# Patient Record
Sex: Female | Born: 1955 | Race: White | Hispanic: No | Marital: Single | Smoking: Former smoker
Health system: Southern US, Community
[De-identification: ages and names within clinical notes are randomized; demographics above are authoritative.]

## PROBLEM LIST (undated history)

## (undated) DIAGNOSIS — I10 Essential (primary) hypertension: Secondary | ICD-10-CM

## (undated) DIAGNOSIS — L719 Rosacea, unspecified: Secondary | ICD-10-CM

## (undated) DIAGNOSIS — F32A Depression, unspecified: Secondary | ICD-10-CM

## (undated) DIAGNOSIS — F419 Anxiety disorder, unspecified: Secondary | ICD-10-CM

## (undated) DIAGNOSIS — E079 Disorder of thyroid, unspecified: Secondary | ICD-10-CM

## (undated) DIAGNOSIS — M1990 Unspecified osteoarthritis, unspecified site: Secondary | ICD-10-CM

---

## 2010-06-30 DEATH — deceased

## 2014-12-31 HISTORY — PX: SHOULDER SURGERY: SHX246

## 2016-01-01 HISTORY — PX: SHOULDER SURGERY: SHX246

## 2022-02-08 ENCOUNTER — Encounter: Payer: Self-pay | Admitting: Student in an Organized Health Care Education/Training Program

## 2022-02-08 ENCOUNTER — Other Ambulatory Visit: Payer: Self-pay

## 2022-02-08 ENCOUNTER — Ambulatory Visit
Payer: Medicare Other | Attending: Student in an Organized Health Care Education/Training Program | Admitting: Student in an Organized Health Care Education/Training Program

## 2022-02-08 VITALS — BP 133/72 | HR 60 | Temp 97.1°F | Resp 18 | Ht 67.0 in | Wt 200.0 lb

## 2022-02-08 DIAGNOSIS — G894 Chronic pain syndrome: Secondary | ICD-10-CM | POA: Insufficient documentation

## 2022-02-08 DIAGNOSIS — M5136 Other intervertebral disc degeneration, lumbar region: Secondary | ICD-10-CM | POA: Diagnosis present

## 2022-02-08 DIAGNOSIS — M47816 Spondylosis without myelopathy or radiculopathy, lumbar region: Secondary | ICD-10-CM | POA: Diagnosis present

## 2022-02-08 DIAGNOSIS — Z9889 Other specified postprocedural states: Secondary | ICD-10-CM | POA: Insufficient documentation

## 2022-02-08 DIAGNOSIS — M51369 Other intervertebral disc degeneration, lumbar region without mention of lumbar back pain or lower extremity pain: Secondary | ICD-10-CM | POA: Insufficient documentation

## 2022-02-08 NOTE — Assessment & Plan Note (Signed)
Multimodal pain regimen of gabapentin 800 mg 3 times daily, lidocaine patch, Robaxin, hydrocodone daily as needed.  We will obtain urine toxicology screen today.

## 2022-02-08 NOTE — Assessment & Plan Note (Signed)
Therapeutic lumbar facet medial branch ablations right L3, L4, L5 every 6 months.  Previously done in [place].

## 2022-02-08 NOTE — Progress Notes (Signed)
Safety precautions to be maintained throughout the outpatient stay will include: orient to surroundings, keep bed in low position, maintain call bell within reach at all times, provide assistance with transfer out of bed and ambulation.  

## 2022-02-08 NOTE — Patient Instructions (Signed)

## 2022-02-08 NOTE — Progress Notes (Signed)
Sandy Byrd: Sandy Byrd  Service Category: E/M  Provider: Edward Jolly, MD  DOB: [birth year]  DOS: 02/08/2022  Referring Provider: Enid Baas, MD  MRN: [id number]  Setting: Ambulatory outpatient  PCP: Enid Baas, MD  Type: New Sandy Byrd  Specialty: Interventional Pain Management    Location: Office  Delivery: Face-to-face     Primary Reason(s) for Visit: Encounter for initial evaluation of one or more chronic problems (new to examiner) potentially causing chronic pain, and posing a threat to normal musculoskeletal function. (Level of risk: High) CC: Back Pain (R>L)  HPI  Sandy Byrd is a 67 y.o. year old, female Sandy Byrd, who comes for the first time to our practice referred by Enid Baas, MD for our initial evaluation of her chronic pain. She has Lumbar facet joint syndrome; Lumbar degenerative disc disease; Chronic pain syndrome; and H/O rotator cuff surgery (bilateral) on their problem list. Today she comes in for evaluation of her Back Pain (R>L)  Pain Assessment: Location: Lower, Right, Left (right pain is worse) Back (itchy on outer right thigh) Radiating: Pain radiates from lower right back into back of thigh into back of right knee. Onset: More than a month ago Duration: Chronic pain Quality: Sharp, Shooting, Constant Severity: 8 /10 (subjective, self-reported pain score)  Effect on ADL: "It really affects it because it is hard to sit and move to somewhere else because getting up is hard and keeps me at home". Timing: Constant Modifying factors: 3,000mg  tylenol a day, gabapentin, heating pads and ice BP: 133/72   HR: 60  Onset and Duration: Gradual Cause of pain: Unknown Severity: Getting worse, NAS-11 at its worse: 9/10, NAS-11 at its best: 5/10, NAS-11 now: 8/10, and NAS-11 on the average: 7/10 Timing: Afternoon, Night, During activity or exercise, and After activity or exercise Aggravating Factors: Bending, Climbing, Kneeling, Lifiting, Prolonged sitting,  and Prolonged standing Alleviating Factors: Stretching, Cold packs, Hot packs, and Medications Associated Problems: Depression, Fatigue, Spasms, Tingling, Weakness, Pain that wakes Sandy Byrd up, and Pain that does not allow Sandy Byrd to sleep Quality of Pain: Agonizing, Constant, Disabling, Exhausting, Horrible, Itching, Sharp, and Stabbing Previous Examinations or Tests: MRI scan, X-rays, and Orthopedic evaluation Previous Treatments: Narcotic medications  Sandy Byrd is a very pleasant 67 year old female who has recently moved from [place] hoping to establish with pain management in [place].  She has a history of lumbar facet joint syndrome, lumbar degenerative disc disease.  She was receiving lumbar facet ablations on the right side at L3, L4, L5 every 6 months and repeat which was effective in managing her lower back pain.  Her previous ablation on the right side was done in May 2022.  Of note she has tried previous interventions including diagnostic medial branch nerve blocks, epidural injections with limited response.  She states that she receives the greatest functional and analgesic response from the ablations every 6 months.  She has done physical therapy as well and continues to perform home stretching exercises that were directed to her by her previous pain specialist to help alleviate lumbar paraspinal muscle tension..  She utilizes hydrocodone 5 mg daily on an as-needed basis.  She is on a multimodal regimen which includes gabapentin, Robaxin, lidocaine patches.  She also uses Celebrex 20 mg daily as needed.  She has a history of bilateral shoulder specifically rotator cuff surgery.  The primary pain complaint however is her lower back.   Historic Controlled Substance Pharmacotherapy Review  PMP and historical list of controlled substances: Hydrocodone 5 mg daily as  needed Historical Monitoring: The Sandy Byrd  has no history on file for drug use. List of all UDS Test(s): No results found for:  MDMA, COCAINSCRNUR, PCPSCRNUR, PCPQUANT, CANNABQUANT, THCU, ETH List of other Serum/Urine Drug Screening Test(s):  No results found for: AMPHSCRSER, BARBSCRSER, BENZOSCRSER, COCAINSCRSER, COCAINSCRNUR, PCPSCRSER, PCPQUANT, THCSCRSER, THCU, CANNABQUANT, OPIATESCRSER, OXYSCRSER, PROPOXSCRSER, ETH Historical Background Evaluation: [place] PMP: PDMP not reviewed this encounter. Online review of the past 73-month period conducted.              [place] Department of public safety, offender search: Engineer, mining Information) Non-contributory Risk Assessment Profile: Aberrant behavior: None observed or detected today Risk factors for fatal opioid overdose: None identified today Fatal overdose hazard ratio (HR): Calculation deferred Non-fatal overdose hazard ratio (HR): Calculation deferred Risk of opioid abuse or dependence: 0.7-3.0% with doses ? 36 MME/day and 6.1-26% with doses ? 120 MME/day. Substance use disorder (SUD) risk level: See below Personal History of Substance Abuse (SUD-Substance use disorder):  Alcohol: Negative  Illegal Drugs: Negative  Rx Drugs: Negative  ORT Risk Level calculation: Low Risk  Opioid Risk Tool - 02/08/22 0933       Family History of Substance Abuse   Alcohol --   pt is adopted     Personal History of Substance Abuse   Alcohol Negative    Illegal Drugs Negative    Rx Drugs Negative      Age   Age between 16-45 years  No      Psychological Disease   Psychological Disease Negative    Depression Positive      Total Score   Opioid Risk Tool Scoring 1    Opioid Risk Interpretation Low Risk            ORT Scoring interpretation table:  Score <3 = Low Risk for SUD  Score between 4-7 = Moderate Risk for SUD  Score >8 = High Risk for Opioid Abuse   PHQ-2 Depression Scale:  Total score: 0  PHQ-2 Scoring interpretation table: (Score and probability of major depressive disorder)  Score 0 = No depression  Score 1 = 15.4% Probability  Score 2 = 21.1% Probability   Score 3 = 38.4% Probability  Score 4 = 45.5% Probability  Score 5 = 56.4% Probability  Score 6 = 78.6% Probability   PHQ-9 Depression Scale:  Total score: 0  PHQ-9 Scoring interpretation table:  Score 0-4 = No depression  Score 5-9 = Mild depression  Score 10-14 = Moderate depression  Score 15-19 = Moderately severe depression  Score 20-27 = Severe depression (2.4 times higher risk of SUD and 2.89 times higher risk of overuse)   Pharmacologic Plan: As per protocol, I have not taken over any controlled substance management, pending the results of ordered tests and/or consults.            Initial impression: Pending review of available data and ordered tests.  Meds   Current Outpatient Medications:    acetaminophen (TYLENOL) 500 MG tablet, Take by mouth., Disp: , Rfl:    celecoxib (CELEBREX) 200 MG capsule, Take 200 mg by mouth daily., Disp: , Rfl:    cyanocobalamin 1000 MCG tablet, Take by mouth., Disp: , Rfl:    EPINEPHrine 0.3 mg/0.3 mL IJ SOAJ injection, SMARTSIG:1 Pre-Filled Pen Syringe IM PRN, Disp: , Rfl:    ergocalciferol (VITAMIN D2) 1.25 MG (50000 UT) capsule, Take by mouth., Disp: , Rfl:    escitalopram (LEXAPRO) 20 MG tablet, Take by mouth., Disp: , Rfl:  fluticasone (FLONASE) 50 MCG/ACT nasal spray, Place into both nostrils daily., Disp: , Rfl:    folic acid (FOLVITE) 1 MG tablet, Take by mouth., Disp: , Rfl:    gabapentin (NEURONTIN) 800 MG tablet, Take 1 tablet by mouth 3 (three) times daily., Disp: , Rfl:    levothyroxine (SYNTHROID) 100 MCG tablet, Take by mouth., Disp: , Rfl:    lidocaine (LIDODERM) 5 %, Place 1 patch onto the skin as needed. 4%., Disp: , Rfl:    methocarbamol (ROBAXIN) 500 MG tablet, TAKE 1 TABLET BY MOUTH TWICE DAILY AS NEEDED FOR SPASMS, Disp: , Rfl:    metroNIDAZOLE (METROGEL) 1 % gel, Apply topically daily., Disp: , Rfl:    minocycline (MINOCIN) 100 MG capsule, Take by mouth., Disp: , Rfl:    Propylene Glycol (SYSTANE BALANCE OP), Apply to  eye., Disp: , Rfl:    simvastatin (ZOCOR) 40 MG tablet, Take 1 tablet by mouth at bedtime., Disp: , Rfl:    traZODone (DESYREL) 50 MG tablet, Take 1 tablet by mouth at bedtime., Disp: , Rfl:    triamcinolone cream (KENALOG) 0.1 %, Apply topically 2 (two) times daily as needed., Disp: , Rfl:    triamterene-hydrochlorothiazide (DYAZIDE) 37.5-25 MG capsule, Take 1 capsule by mouth daily., Disp: , Rfl:    VENTOLIN HFA 108 (90 Base) MCG/ACT inhaler, SMARTSIG:2 Puff(s) By Mouth Every 4 Hours PRN, Disp: , Rfl:   Imaging Review   Lumbar spine MRI without contrast 05/03/2021: L5-S1: Minimal disc bulge with out significant spinal or neuroforaminal stenosis, mild bilateral facet joint arthropathy. L4-L5 disc degeneration, no spinal or neuroforaminal stenosis, mild bilateral facet joint arthropathy. L3-L4: Minimal disc bulge, bilateral facet joint arthropathy, moderate. L2-L3: No spinal or neuroforaminal stenosis L1-L2: No spinal or neuroforaminal stenosis   Complexity Note: Imaging results reviewed. Results shared with Ms. Merlinda FrederickMcLaughlin, using Layman's terms.                         ROS  Cardiovascular: High blood pressure Pulmonary or Respiratory: No reported pulmonary signs or symptoms such as wheezing and difficulty taking a deep full breath (Asthma), difficulty blowing air out (Emphysema), coughing up mucus (Bronchitis), persistent dry cough, or temporary stoppage of breathing during sleep Neurological: No reported neurological signs or symptoms such as seizures, abnormal skin sensations, urinary and/or fecal incontinence, being born with an abnormal open spine and/or a tethered spinal cord Psychological-Psychiatric: Anxiousness, Depressed, and Difficulty sleeping and or falling asleep Gastrointestinal: No reported gastrointestinal signs or symptoms such as vomiting or evacuating blood, reflux, heartburn, alternating episodes of diarrhea and constipation, inflamed or scarred liver, or pancreas or  irrregular and/or infrequent bowel movements Genitourinary: No reported renal or genitourinary signs or symptoms such as difficulty voiding or producing urine, peeing blood, non-functioning kidney, kidney stones, difficulty emptying the bladder, difficulty controlling the flow of urine, or chronic kidney disease Hematological: No reported hematological signs or symptoms such as prolonged bleeding, low or poor functioning platelets, bruising or bleeding easily, hereditary bleeding problems, low energy levels due to low hemoglobin or being anemic Endocrine: High thyroid Rheumatologic: Joint aches and or swelling due to excess weight (Osteoarthritis) Musculoskeletal: Negative for myasthenia gravis, muscular dystrophy, multiple sclerosis or malignant hyperthermia Work History: Retired  Allergies  Ms. Merlinda FrederickMcLaughlin is allergic to cephalexin, cephalosporins, prochlorperazine, shrimp extract allergy skin test, latex, and other.  PFSH  Drug: Ms. Merlinda FrederickMcLaughlin  has no history on file for drug use. Alcohol:  has no history on file for alcohol  use. Tobacco:  reports that she quit smoking about 20 years ago. Her smoking use included cigarettes. She has never been exposed to tobacco smoke. She has never used smokeless tobacco. Medical:  has no past medical history on file. Family: family history is not on file.  Active Ambulatory Problems    Diagnosis Date Noted   Lumbar facet joint syndrome 02/08/2022   Lumbar degenerative disc disease 02/08/2022   Chronic pain syndrome 02/08/2022   H/O rotator cuff surgery (bilateral) 02/08/2022   Resolved Ambulatory Problems    Diagnosis Date Noted   No Resolved Ambulatory Problems   No Additional Past Medical History   Constitutional Exam  General appearance: Well nourished, well developed, and well hydrated. In no apparent acute distress Vitals:   02/08/22 0916  BP: 133/72  Pulse: 60  Resp: 18  Temp: (!) 97.1 F (36.2 C)  TempSrc: Temporal  SpO2: 95%   Weight: 200 lb (90.7 kg)  Height: 5\' 7"  (1.702 m)   BMI Assessment: Estimated body mass index is 31.32 kg/m as calculated from the following:   Height as of this encounter: 5\' 7"  (1.702 m).   Weight as of this encounter: 200 lb (90.7 kg).  BMI interpretation table: BMI level Category Range association with higher incidence of chronic pain  <18 kg/m2 Underweight   18.5-24.9 kg/m2 Ideal body weight   25-29.9 kg/m2 Overweight Increased incidence by 20%  30-34.9 kg/m2 Obese (Class I) Increased incidence by 68%  35-39.9 kg/m2 Severe obesity (Class II) Increased incidence by 136%  >40 kg/m2 Extreme obesity (Class III) Increased incidence by 254%   Sandy Byrd's current BMI Ideal Body weight  Body mass index is 31.32 kg/m. Ideal body weight: 61.6 kg (135 lb 12.9 oz) Adjusted ideal body weight: 73.2 kg (161 lb 7.7 oz)   BMI Readings from Last 4 Encounters:  02/08/22 31.32 kg/m   Wt Readings from Last 4 Encounters:  02/08/22 200 lb (90.7 kg)    Psych/Mental status: Alert, oriented x 3 (person, place, & time)       Eyes: PERLA Respiratory: No evidence of acute respiratory distress  Cervical Spine Area Exam  Skin & Axial Inspection: No masses, redness, edema, swelling, or associated skin lesions Alignment: Symmetrical Functional ROM: Unrestricted ROM      Stability: No instability detected Muscle Tone/Strength: Functionally intact. No obvious neuro-muscular anomalies detected. Sensory (Neurological): Unimpaired Palpation: No palpable anomalies             Upper Extremity (UE) Exam    Side: Right upper extremity  Side: Left upper extremity  Skin & Extremity Inspection: Evidence of prior arthroplastic surgery  Skin & Extremity Inspection: Evidence of prior arthroplastic surgery  Functional ROM: Unrestricted ROM          Functional ROM: Unrestricted ROM          Muscle Tone/Strength: Functionally intact. No obvious neuro-muscular anomalies detected.  Muscle Tone/Strength: Functionally  intact. No obvious neuro-muscular anomalies detected.  Sensory (Neurological): Unimpaired          Sensory (Neurological): Unimpaired          Palpation: No palpable anomalies              Palpation: No palpable anomalies              Provocative Test(s):  Phalen's test: deferred Tinel's test: deferred Apley's scratch test (touch opposite shoulder):  Action 1 (Across chest): deferred Action 2 (Overhead): deferred Action 3 (LB reach): deferred   Provocative Test(s):  Phalen's test: deferred Tinel's test: deferred Apley's scratch test (touch opposite shoulder):  Action 1 (Across chest): deferred Action 2 (Overhead): deferred Action 3 (LB reach): deferred    Lumbar Spine Area Exam  Skin & Axial Inspection: No masses, redness, or swelling Alignment: Symmetrical Functional ROM: Pain restricted ROM affecting primarily the right Stability: No instability detected Muscle Tone/Strength: Functionally intact. No obvious neuro-muscular anomalies detected. Sensory (Neurological): Musculoskeletal pain pattern Palpation: No palpable anomalies       Provocative Tests:  Lumbar quadrant test (Kemp's test): (+) Right facet joint pain       Lateral bending test: Right facet mediated pain  Gait & Posture Assessment  Ambulation: Unassisted Gait: Relatively normal for age and body habitus Posture: WNL  Lower Extremity Exam    Side: Right lower extremity  Side: Left lower extremity  Stability: No instability observed          Stability: No instability observed          Skin & Extremity Inspection: Skin color, temperature, and hair growth are WNL. No peripheral edema or cyanosis. No masses, redness, swelling, asymmetry, or associated skin lesions. No contractures.  Skin & Extremity Inspection: Skin color, temperature, and hair growth are WNL. No peripheral edema or cyanosis. No masses, redness, swelling, asymmetry, or associated skin lesions. No contractures.  Functional ROM: Unrestricted ROM                   Functional ROM: Unrestricted ROM                  Muscle Tone/Strength: Functionally intact. No obvious neuro-muscular anomalies detected.  Muscle Tone/Strength: Functionally intact. No obvious neuro-muscular anomalies detected.  Sensory (Neurological): Unimpaired        Sensory (Neurological): Unimpaired        DTR: Patellar: deferred today Achilles: deferred today Plantar: deferred today  DTR: Patellar: deferred today Achilles: deferred today Plantar: deferred today  Palpation: No palpable anomalies  Palpation: No palpable anomalies    Assessment  Primary Diagnosis & Pertinent Problem List: The primary encounter diagnosis was Lumbar facet arthropathy. Diagnoses of Lumbar spondylosis, Lumbar degenerative disc disease, Lumbar facet joint syndrome, Chronic pain syndrome, and H/O rotator cuff surgery (bilateral) were also pertinent to this visit.  Visit Diagnosis (New problems to examiner): 1. Lumbar facet arthropathy   2. Lumbar spondylosis   3. Lumbar degenerative disc disease   4. Lumbar facet joint syndrome   5. Chronic pain syndrome   6. H/O rotator cuff surgery (bilateral)    Plan of Care (Initial workup plan)  Note: Ms. Harral was reminded that as per protocol, today's visit has been an evaluation only. We have not taken over the Sandy Byrd's controlled substance management.   Today we discussed treatment steps which will include urine toxicology screen which is customary for new patients being established with pain management.  I will also place an order for a right L3, L4, L5 radiofrequency ablation for lumbar facet joint syndrome and lumbar spondylosis.  This was previously done May 2022 at [facility].  She receives these every 6 months on the right side at L3, L4, L5 medial branch.   I will discussed peripheral nerve stimulation with her as a potential therapeutic option to help target her low back pain related to lumbar facet arthropathy as  this could be multifidus sparing.  Continue with home physical therapy stretching exercises.   Problem-specific plan: Chronic pain syndrome Multimodal pain regimen of gabapentin 800  mg 3 times daily, lidocaine patch, Robaxin, hydrocodone daily as needed.  We will obtain urine toxicology screen today.  Lumbar facet joint syndrome Therapeutic lumbar facet medial branch ablations right L3, L4, L5 every 6 months.  Previously done in [place]. Lab Orders         Compliance Drug Analysis, Ur      Procedure Orders         Radiofrequency,Lumbar     Pharmacotherapy (current): Medications ordered:  No orders of the defined types were placed in this encounter.  Medications administered during this visit: Patsy Lager had no medications administered during this visit.   Pharmacological management options:  Opioid Analgesics: The Sandy Byrd was informed that there is no guarantee that she would be a candidate for opioid analgesics. The decision will be made following CDC guidelines. This decision will be based on the results of diagnostic studies, as well as Ms. Kaman's risk profile.   Membrane stabilizer:  Gabapentin 800 mg 3 times daily  Muscle relaxant:  Robaxin as needed  NSAID: Adequate regimen Celebrex as needed  Other analgesic(s): To be determined at a later time   Interventional management options: Right L3, L4, L5 lumbar medial branch radiofrequency ablation Sprint peripheral nerve stimulation of right L4 medial branch    Provider-requested follow-up: Return in about 2 weeks (around 02/22/2022) for R L3, 4, 5 RFA, in clinic (PO Valium).  I spent a total of 60 minutes reviewing chart data, face-to-face evaluation with the Sandy Byrd, counseling and coordination of care as detailed above.  No future appointments.  Note by: Edward Jolly, MD Date: 02/08/2022; Time: 10:25 AM

## 2022-02-15 LAB — COMPLIANCE DRUG ANALYSIS, UR

## 2022-02-26 ENCOUNTER — Encounter: Payer: Self-pay | Admitting: Student in an Organized Health Care Education/Training Program

## 2022-02-26 ENCOUNTER — Ambulatory Visit: Payer: Medicare Other | Admitting: Student in an Organized Health Care Education/Training Program

## 2022-02-26 ENCOUNTER — Other Ambulatory Visit: Payer: Self-pay

## 2022-02-26 ENCOUNTER — Ambulatory Visit
Admission: RE | Admit: 2022-02-26 | Discharge: 2022-02-26 | Disposition: A | Discharge status: Home / Self Care | Payer: Medicare Other | Source: Ambulatory Visit | Attending: Student in an Organized Health Care Education/Training Program | Admitting: Student in an Organized Health Care Education/Training Program

## 2022-02-26 DIAGNOSIS — G894 Chronic pain syndrome: Secondary | ICD-10-CM | POA: Insufficient documentation

## 2022-02-26 DIAGNOSIS — M47816 Spondylosis without myelopathy or radiculopathy, lumbar region: Secondary | ICD-10-CM

## 2022-02-26 MED ORDER — DIAZEPAM 5 MG PO TABS
5.0000 mg | ORAL_TABLET | ORAL | Status: AC
  Administered 2022-02-26: 5 mg via ORAL

## 2022-02-26 MED ORDER — DIAZEPAM 5 MG PO TABS
ORAL_TABLET | ORAL | Status: AC
  Filled 2022-02-26: qty 1

## 2022-02-26 MED ORDER — ROPIVACAINE HCL 2 MG/ML IJ SOLN
4.0000 mL | Freq: Once | INTRAMUSCULAR | Status: AC
  Administered 2022-02-26: 20 mL via INTRA_ARTICULAR
  Filled 2022-02-26: qty 20

## 2022-02-26 MED ORDER — LIDOCAINE HCL 2 % IJ SOLN
20.0000 mL | Freq: Once | INTRAMUSCULAR | Status: AC
Start: 2022-02-26 — End: 2022-02-26
  Administered 2022-02-26: 400 mg
  Filled 2022-02-26: qty 20

## 2022-02-26 MED ORDER — DEXAMETHASONE SODIUM PHOSPHATE 10 MG/ML IJ SOLN
10.0000 mg | Freq: Once | INTRAMUSCULAR | Status: AC
  Administered 2022-02-26: 10 mg
  Filled 2022-02-26: qty 1

## 2022-02-26 NOTE — Patient Instructions (Addendum)

## 2022-02-26 NOTE — Progress Notes (Signed)
Safety precautions to be maintained throughout the outpatient stay will include: orient to surroundings, keep bed in low position, maintain call bell within reach at all times, provide assistance with transfer out of bed and ambulation.  

## 2022-02-26 NOTE — Progress Notes (Signed)
PROVIDER NOTE: Interpretation of information contained herein should be left to medically-trained personnel. Specific patient instructions are provided elsewhere under "Patient Instructions" section of medical record. This document was created in part using STT-dictation technology, any transcriptional errors that may result from this process are unintentional.  Patient: Sandy Byrd Type: Established DOB: Apr 19, 1955 MRN: [id number] PCP: Enid Baas, MD  Service: Procedure DOS: 02/26/2022 Setting: Ambulatory Location: Ambulatory outpatient facility Delivery: Face-to-face Provider: Edward Jolly, MD Specialty: Interventional Pain Management Specialty designation: 09 Location: Outpatient facility Ref. Prov.: Edward Jolly, MD    Primary Reason for Visit: Interventional Pain Management Treatment. CC: Back Pain (Lumbar bilateral, right is worse )  Procedure #1:   Type: Lumbar Facet, Medial Branch Radiofrequency Ablation (RFA) #1  Laterality: Right  Level: L3, L4, L5, Medial Branch Level(s). These levels will denervate the L3-4 and L5-S1 lumbar facet joints.  Imaging: Fluoroscopic guidance Anesthesia: Local anesthesia (1-2% Lidocaine) Anxiolysis: Oral Valium         Sedation: None. DOS: 02/26/2022  Performed by: Edward Jolly, MD  Purpose: Therapeutic/Palliative Indications: Low back pain severe enough to impact quality of life or function. Indications: 1. Lumbar facet arthropathy   2. Lumbar spondylosis   3. Chronic pain syndrome   4. Lumbar facet joint syndrome    Sandy Byrd has been dealing with the above chronic pain for longer than three months and has either failed to respond, was unable to tolerate, or simply did not get enough benefit from other more conservative therapies including, but not limited to: 1. Over-the-counter medications 2. Anti-inflammatory medications 3. Muscle relaxants 4. Membrane stabilizers 5. Opioids 6. Physical therapy and/or chiropractic  manipulation 7. Modalities (Heat, ice, etc.) 8. Invasive techniques such as nerve blocks. Sandy Byrd has attained more than 50% relief of the pain from a series of diagnostic injections conducted in separate occasions.  Pain Score: Pre-procedure: 7 /10 Post-procedure: 4 /10     Position / Prep / Materials:  Position: Prone  Prep solution: DuraPrep (Iodine Povacrylex [0.7% available iodine] and Isopropyl Alcohol, 74% w/w) Prep Area: Entire Lumbosacral Region (Lower back from mid-thoracic region to end of tailbone and from flank to flank.) Materials:  Tray: RFA (Radiofrequency) tray Needle(s):  Type: RFA (Teflon-coated radiofrequency ablation needles) Gauge (G): 22  Length: Regular (10cm) Qty: 3  Pre-op H&P Assessment:  Sandy Byrd is a 67 y.o. (year old), female patient, seen today for interventional treatment. She  has no past surgical history on file. Sandy Byrd has a current medication list which includes the following prescription(s): acetaminophen, aspirin, celecoxib, celecoxib, cyanocobalamin, cyanocobalamin, epinephrine, ergocalciferol, escitalopram, fluticasone, folic acid, gabapentin, levothyroxine, lidocaine, methocarbamol, metronidazole, minocycline, propylene glycol, simvastatin, trazodone, triamcinolone cream, triamterene-hydrochlorothiazide, and ventolin hfa. Her primarily concern today is the Back Pain (Lumbar bilateral, right is worse )  Initial Vital Signs:  Pulse/HCG Rate: 66ECG Heart Rate: 69 Temp:  (!) 97.3 F (36.3 C) Resp: 16 BP: 130/76 SpO2: 100 %  BMI: Estimated body mass index is 32.89 kg/m as calculated from the following:   Height as of this encounter: 5\' 7"  (1.702 m).   Weight as of this encounter: 210 lb (95.3 kg).  Risk Assessment: Allergies: Reviewed. She is allergic to cephalexin, cephalosporins, prochlorperazine, shrimp extract allergy skin test, latex, and other.  Allergy Precautions: None required Coagulopathies: Reviewed. None  identified.  Blood-thinner therapy: None at this time Active Infection(s): Reviewed. None identified. Sandy Byrd is afebrile  Site Confirmation: Sandy Byrd was asked to confirm the procedure and laterality before marking the  site Procedure checklist: Completed Consent: Before the procedure and under the influence of no sedative(s), amnesic(s), or anxiolytics, the patient was informed of the treatment options, risks and possible complications. To fulfill our ethical and legal obligations, as recommended by the American Medical Association's Code of Ethics, I have informed the patient of my clinical impression; the nature and purpose of the treatment or procedure; the risks, benefits, and possible complications of the intervention; the alternatives, including doing nothing; the risk(s) and benefit(s) of the alternative treatment(s) or procedure(s); and the risk(s) and benefit(s) of doing nothing. The patient was provided information about the general risks and possible complications associated with the procedure. These may include, but are not limited to: failure to achieve desired goals, infection, bleeding, organ or nerve damage, allergic reactions, paralysis, and death. In addition, the patient was informed of those risks and complications associated to Spine-related procedures, such as failure to decrease pain; infection (i.e.: Meningitis, epidural or intraspinal abscess); bleeding (i.e.: epidural hematoma, subarachnoid hemorrhage, or any other type of intraspinal or peri-dural bleeding); organ or nerve damage (i.e.: Any type of peripheral nerve, nerve root, or spinal cord injury) with subsequent damage to sensory, motor, and/or autonomic systems, resulting in permanent pain, numbness, and/or weakness of one or several areas of the body; allergic reactions; (i.e.: anaphylactic reaction); and/or death. Furthermore, the patient was informed of those risks and complications associated with the  medications. These include, but are not limited to: allergic reactions (i.e.: anaphylactic or anaphylactoid reaction(s)); adrenal axis suppression; blood sugar elevation that in diabetics may result in ketoacidosis or comma; water retention that in patients with history of congestive heart failure may result in shortness of breath, pulmonary edema, and decompensation with resultant heart failure; weight gain; swelling or edema; medication-induced neural toxicity; particulate matter embolism and blood vessel occlusion with resultant organ, and/or nervous system infarction; and/or aseptic necrosis of one or more joints. Finally, the patient was informed that Medicine is not an exact science; therefore, there is also the possibility of unforeseen or unpredictable risks and/or possible complications that may result in a catastrophic outcome. The patient indicated having understood very clearly. We have given the patient no guarantees and we have made no promises. Enough time was given to the patient to ask questions, all of which were answered to the patient's satisfaction. Ms. Goshert has indicated that she wanted to continue with the procedure. Attestation: I, the ordering provider, attest that I have discussed with the patient the benefits, risks, side-effects, alternatives, likelihood of achieving goals, and potential problems during recovery for the procedure that I have provided informed consent. Date   Time: 02/26/2022  9:38 AM  Pre-Procedure Preparation:  Monitoring: As per clinic protocol. Respiration, ETCO2, SpO2, BP, heart rate and rhythm monitor placed and checked for adequate function Safety Precautions: Patient was assessed for positional comfort and pressure points before starting the procedure. Time-out: I initiated and conducted the "Time-out" before starting the procedure, as per protocol. The patient was asked to participate by confirming the accuracy of the "Time Out" information.  Verification of the correct person, site, and procedure were performed and confirmed by me, the nursing staff, and the patient. "Time-out" conducted as per Joint Commission's Universal Protocol (UP.01.01.01). Time: 1043  Description of Procedure:          Laterality: Right Levels:  L3, L4, L5,Medial Branch Level(s), at the L3-4 and L5-S1 lumbar facet joints. Safety Precautions: Aspiration looking for blood return was conducted prior to all injections. At no point  did we inject any substances, as a needle was being advanced. Before injecting, the patient was told to immediately notify me if she was experiencing any new onset of "ringing in the ears, or metallic taste in the mouth". No attempts were made at seeking any paresthesias. Safe injection practices and needle disposal techniques used. Medications properly checked for expiration dates. SDV (single dose vial) medications used. After the completion of the procedure, all disposable equipment used was discarded in the proper designated medical waste containers. Local Anesthesia: Protocol guidelines were followed. The patient was positioned over the fluoroscopy table. The area was prepped in the usual manner. The time-out was completed. The target area was identified using fluoroscopy. A 12-in long, straight, sterile hemostat was used with fluoroscopic guidance to locate the targets for each level blocked. Once located, the skin was marked with an approved surgical skin marker. Once all sites were marked, the skin (epidermis, dermis, and hypodermis), as well as deeper tissues (fat, connective tissue and muscle) were infiltrated with a small amount of a short-acting local anesthetic, loaded on a 10cc syringe with a 25G, 1.5-in  Needle. An appropriate amount of time was allowed for local anesthetics to take effect before proceeding to the next step. Technical description of process:  Radiofrequency Ablation (RFA) L3 Medial Branch Nerve RFA: The target area  for the L3 medial branch is at the junction of the postero-lateral aspect of the superior articular process and the superior, posterior, and medial edge of the transverse process of L4. Under fluoroscopic guidance, a Radiofrequency needle was inserted until contact was made with os over the superior postero-lateral aspect of the pedicular shadow (target area). Sensory and motor testing was conducted to properly adjust the position of the needle. Once satisfactory placement of the needle was achieved, the numbing solution was slowly injected after negative aspiration for blood. 2.0 mL of the nerve block solution was injected without difficulty or complication. After waiting for at least 3 minutes, the ablation was performed. Once completed, the needle was removed intact. L4 Medial Branch Nerve RFA: The target area for the L4 medial branch is at the junction of the postero-lateral aspect of the superior articular process and the superior, posterior, and medial edge of the transverse process of L5. Under fluoroscopic guidance, a Radiofrequency needle was inserted until contact was made with os over the superior postero-lateral aspect of the pedicular shadow (target area). Sensory and motor testing was conducted to properly adjust the position of the needle. Once satisfactory placement of the needle was achieved, the numbing solution was slowly injected after negative aspiration for blood. 2.0 mL of the nerve block solution was injected without difficulty or complication. After waiting for at least 3 minutes, the ablation was performed. Once completed, the needle was removed intact. L5 Medial Branch Nerve RFA: The target area for the L5 medial branch is at the junction of the postero-lateral aspect of the superior articular process of S1 and the superior, posterior, and medial edge of the sacral ala. Under fluoroscopic guidance, a Radiofrequency needle was inserted until contact was made with os over the superior  postero-lateral aspect of the pedicular shadow (target area). Sensory and motor testing was conducted to properly adjust the position of the needle. Once satisfactory placement of the needle was achieved, the numbing solution was slowly injected after negative aspiration for blood. 2.0 mL of the nerve block solution was injected without difficulty or complication. After waiting for at least 3 minutes, the ablation was performed. Once  completed, the needle was removed intact. Radiofrequency lesioning (ablation):  Radiofrequency Generator: Medtronic AccurianTM AG 1000 RF Generator Sensory Stimulation Parameters: 50 Hz was used to locate & identify the nerve, making sure that the needle was positioned such that there was no sensory stimulation below 0.3 V or above 0.7 V. Motor Stimulation Parameters: 2 Hz was used to evaluate the motor component. Care was taken not to lesion any nerves that demonstrated motor stimulation of the lower extremities at an output of less than 2.5 times that of the sensory threshold, or a maximum of 2.0 V. Lesioning Technique Parameters: Standard Radiofrequency settings. (Not bipolar or pulsed.) Temperature Settings: 80 degrees C Lesioning time: 60 seconds Intra-operative Compliance: Compliant  6 cc solution made of 5 cc of 0.2% ropivacaine, 1 cc of Decadron 10 mg/cc.  2 cc injected at each level above on the right after sensorimotor testing, prior to lesioning.  Once the entire procedure was completed, the treated area was cleaned, making sure to leave some of the prepping solution back to take advantage of its long term bactericidal properties.    Illustration of the posterior view of the lumbar spine and the posterior neural structures. Laminae of L2 through S1 are labeled. DPRL5, dorsal primary ramus of L5; DPRS1, dorsal primary ramus of S1; DPR3, dorsal primary ramus of L3; FJ, facet (zygapophyseal) joint L3-L4; I, inferior articular process of L4; LB1, lateral branch of  dorsal primary ramus of L1; IAB, inferior articular branches from L3 medial branch (supplies L4-L5 facet joint); IBP, intermediate branch plexus; MB3, medial branch of dorsal primary ramus of L3; NR3, third lumbar nerve root; S, superior articular process of L5; SAB, superior articular branches from L4 (supplies L4-5 facet joint also); TP3, transverse process of L3.  Vitals:   02/26/22 1050 02/26/22 1055 02/26/22 1100 02/26/22 1104  BP: 139/86 132/85 133/79 132/89  Pulse:      Resp: 12 12 12 16   Temp:      TempSrc:      SpO2: 100% 99% 100% 100%  Weight:      Height:       Start Time: 1044 hrs. End Time: 1103 hrs.  Imaging Guidance (Spinal):          Type of Imaging Technique: Fluoroscopy Guidance (Spinal) Indication(s): Assistance in needle guidance and placement for procedures requiring needle placement in or near specific anatomical locations not easily accessible without such assistance. Exposure Time: Please see nurses notes. Contrast: None used. Fluoroscopic Guidance: I was personally present during the use of fluoroscopy. "Tunnel Vision Technique" used to obtain the best possible view of the target area. Parallax error corrected before commencing the procedure. "Direction-depth-direction" technique used to introduce the needle under continuous pulsed fluoroscopy. Once target was reached, antero-posterior, oblique, and lateral fluoroscopic projection used confirm needle placement in all planes. Images permanently stored in EMR. Interpretation: No contrast injected. I personally interpreted the imaging intraoperatively. Adequate needle placement confirmed in multiple planes. Permanent images saved into the patient's record.  Post-operative Assessment:  Post-procedure Vital Signs:  Pulse/HCG Rate: 6669 Temp:  (!) 97.3 F (36.3 C) Resp: 16 BP: 132/89 SpO2: 100 %  EBL: None  Complications: No immediate post-treatment complications observed by team, or reported by  patient.  Note: The patient tolerated the entire procedure well. A repeat set of vitals were taken after the procedure and the patient was kept under observation following institutional policy, for this type of procedure. Post-procedural neurological assessment was performed, showing return to baseline, prior to  discharge. The patient was provided with post-procedure discharge instructions, including a section on how to identify potential problems. Should any problems arise concerning this procedure, the patient was given instructions to immediately contact us, at any time, without hesitation. In any case, we plan to contact the patient by telephone for a follow-up status report regarding this interventional procedure.  Comments:  No additional relevant information.  5 out of 5 strength bilateral lower extremity: Plantar flexion, dorsiflexion, knee flexion, knee extension.   Plan of Care  Orders:  Orders Placed This Encounter  Procedures   DG PAIN CLIN[facility] C-ARM 1-60 MIN NO REPORT    Intraoperative interpretation by procedural physician at [facility].    Standing Status:   Standing    Number of Occurrences:   1    Order Specific Question:   Reason for exam:    Answer:   Assistance in needle guidance and placement for procedures requiring needle placement in or near specific anatomical locations not easily accessible without such assistance.   Medications ordered for procedure: Meds ordered this encounter  Medications   lidocaine (XYLOCAINE) 2 % (with pres) injection 400 mg   diazepam (VALIUM) tablet 5 mg    Make sure Flumazenil is available in the pyxis when using this medication. If oversedation occurs, administer 0.2 mg IV over 15 sec. If after 45 sec no response, administer 0.2 mg again over 1 min; may repeat at 1 min intervals; not to exceed 4 doses (1 mg)   dexamethasone (DECADRON) injection 10 mg   ropivacaine (PF) 2 mg/mL (0.2%) (NAROPIN) injection 4 mL   Medications  administered: We administered lidocaine, diazepam, dexamethasone, and ropivacaine (PF) 2 mg/mL (0.2%).  See the medical record for exact dosing, route, and time of administration.  Follow-up plan:   Return in about 5 weeks (around 04/02/2022) for F2F eval.       R L3,4,5 RFA 02/26/22  Recent Visits Date Type Provider Dept  02/08/22 Office Visit Edward Jolly, MD [facility]  Showing recent visits within past 90 days and meeting all other requirements Today's Visits Date Type Provider Dept  02/26/22 Procedure visit Edward Jolly, MD [facility]  Showing today's visits and meeting all other requirements Future Appointments Date Type Provider Dept  04/02/22 Appointment Edward Jolly, MD [facility]  Showing future appointments within next 90 days and meeting all other requirements  Disposition: Discharge home  Discharge (Date   Time): 02/26/2022; 1106 hrs.   Primary Care Physician: Enid Baas, MD Location: [facility] Note by: Edward Jolly, MD Date: 02/26/2022; Time: 11:54 AM  Disclaimer:  Medicine is not an exact science. The only guarantee in medicine is that nothing is guaranteed. It is important to note that the decision to proceed with this intervention was based on the information collected from the patient. The Data and conclusions were drawn from the patient's questionnaire, the interview, and the physical examination. Because the information was provided in large part by the patient, it cannot be guaranteed that it has not been purposely or unconsciously manipulated. Every effort has been made to obtain as much relevant data as possible for this evaluation. It is important to note that the conclusions that lead to this procedure are derived in large part from the available data. Always take into account that the treatment will also be dependent on availability of resources and existing treatment guidelines, considered  by other Pain Management Practitioners as being common knowledge and practice, at  the time of the intervention. For Medico-Legal purposes, it is also important to point out that variation in procedural techniques and pharmacological choices are the acceptable norm. The indications, contraindications, technique, and results of the above procedure should only be interpreted and judged by a Board-Certified Interventional Pain Specialist with extensive familiarity and expertise in the same exact procedure and technique.

## 2022-02-27 ENCOUNTER — Encounter: Payer: Self-pay | Admitting: *Deleted

## 2022-02-27 ENCOUNTER — Telehealth: Payer: Self-pay | Admitting: *Deleted

## 2022-02-27 ENCOUNTER — Encounter: Payer: Self-pay | Admitting: Student in an Organized Health Care Education/Training Program

## 2022-02-27 DIAGNOSIS — M47816 Spondylosis without myelopathy or radiculopathy, lumbar region: Secondary | ICD-10-CM

## 2022-02-27 DIAGNOSIS — G894 Chronic pain syndrome: Secondary | ICD-10-CM

## 2022-02-27 MED ORDER — HYDROCODONE-ACETAMINOPHEN 5-325 MG PO TABS: 1.0000 | ORAL_TABLET | Freq: Every day | ORAL | 0 refills | Status: AC | PRN

## 2022-02-27 NOTE — Telephone Encounter (Signed)
No problems post procedure. 

## 2022-04-02 ENCOUNTER — Encounter: Payer: Self-pay | Admitting: Student in an Organized Health Care Education/Training Program

## 2022-04-02 ENCOUNTER — Ambulatory Visit
Payer: Medicare Other | Attending: Student in an Organized Health Care Education/Training Program | Admitting: Student in an Organized Health Care Education/Training Program

## 2022-04-02 VITALS — BP 138/72 | HR 86 | Temp 96.9°F | Resp 18 | Ht 67.0 in | Wt 211.0 lb

## 2022-04-02 DIAGNOSIS — Z9889 Other specified postprocedural states: Secondary | ICD-10-CM

## 2022-04-02 DIAGNOSIS — M5136 Other intervertebral disc degeneration, lumbar region: Secondary | ICD-10-CM | POA: Insufficient documentation

## 2022-04-02 DIAGNOSIS — G894 Chronic pain syndrome: Secondary | ICD-10-CM | POA: Diagnosis present

## 2022-04-02 DIAGNOSIS — M47816 Spondylosis without myelopathy or radiculopathy, lumbar region: Secondary | ICD-10-CM

## 2022-04-02 DIAGNOSIS — M51369 Other intervertebral disc degeneration, lumbar region without mention of lumbar back pain or lower extremity pain: Secondary | ICD-10-CM

## 2022-04-02 MED ORDER — HYDROCODONE-ACETAMINOPHEN 5-325 MG PO TABS: 1.0000 | ORAL_TABLET | Freq: Every day | ORAL | 0 refills | Status: AC | PRN

## 2022-04-02 NOTE — Progress Notes (Signed)
Nursing Pain Medication Assessment:  ?Safety precautions to be maintained throughout the outpatient stay will include: orient to surroundings, keep bed in low position, maintain call bell within reach at all times, provide assistance with transfer out of bed and ambulation.  ?Medication Inspection Compliance: Pill count conducted under aseptic conditions, in front of the patient. Neither the pills nor the bottle was removed from the patient's sight at any time. Once count was completed pills were immediately returned to the patient in their original bottle. ? ?Medication: Hydrocodone/APAP ?Pill/Patch Count:  0 of 7 pills remain ?Pill/Patch Appearance: Markings consistent with prescribed medication ?Bottle Appearance: Standard pharmacy container. Clearly labeled. ?Filled Date: 02 / 28 / 2023 ?Last Medication intake:   last week ?

## 2022-04-02 NOTE — Progress Notes (Signed)
PROVIDER NOTE: Information contained herein reflects review and annotations entered in association with encounter. Interpretation of such information and data should be left to medically-trained personnel. Information provided to patient can be located elsewhere in the medical record under "Patient Instructions". Document created using STT-dictation technology, any transcriptional errors that may result from process are unintentional.  ?  ?Patient: Sandy Byrd  Service Category: E/M  Provider: Gillis Santa, MD  ?DOB: [birth year]  DOS: 04/02/2022  Specialty: Interventional Pain Management  ?MRN: [id number]  Setting: Ambulatory outpatient  PCP: Gladstone Lighter, MD  ?Type: Established Patient    Referring Provider: Gladstone Lighter, MD  ?Location: Office  Delivery: Face-to-face    ? ?HPI  ?Sandy Byrd, a 67 y.o. year old female, is here today because of her Lumbar facet arthropathy [M47.816]. Sandy Byrd's primary complain today is Back Pain (low) ?Last encounter: My last encounter with her was on 02/26/2022. ?Pertinent problems: Ms. Roome has Lumbar facet joint syndrome; Lumbar degenerative disc disease; Chronic pain syndrome; and H/O rotator cuff surgery (bilateral) on their pertinent problem list. ?Pain Assessment: Severity of Chronic pain is reported as a 6 /10. Location: Back Lower/denies. Onset: More than a month ago. Quality: Sharp. Timing: Constant. Modifying factor(s): meds,. ?Vitals:  height is 5\' 7"  (1.702 m) and weight is 211 lb (95.7 kg). Her temperature is 96.9 ?F (36.1 ?C) (abnormal). Her blood pressure is 138/72 and her pulse is 86. Her respiration is 18 and oxygen saturation is 99%.  ? ?Reason for encounter: both, medication management and post-procedure evaluation and assessment.  ? ?Pharmacy only filled 7 days worth of patient's hydrocodone.  She will need a new prescription for a quantity of 30.  Otherwise she is doing well after her RFA.  See postprocedural eval  below. ? ? ?Post-procedure evaluation  ? Type: Lumbar Facet, Medial Branch Radiofrequency Ablation (RFA) #1  ?Laterality: Right  ?Level: L3, L4, L5, Medial Branch Level(s). These levels will denervate the L3-4 and L5-S1 lumbar facet joints.  ?Imaging: Fluoroscopic guidance ?Anesthesia: Local anesthesia (1-2% Lidocaine) ?Anxiolysis: Oral Valium         ?Sedation: None. ?DOS: 02/26/2022  ?Performed by: Gillis Santa, MD ? ?Purpose: Therapeutic/Palliative ?Indications: Low back pain severe enough to impact quality of life or function. ?Indications: ?1. Lumbar facet arthropathy   ?2. Lumbar spondylosis   ?3. Chronic pain syndrome   ?4. Lumbar facet joint syndrome   ? ?Sandy Byrd has been dealing with the above chronic pain for longer than three months and has either failed to respond, was unable to tolerate, or simply did not get enough benefit from other more conservative therapies including, but not limited to: ?1. Over-the-counter medications ?2. Anti-inflammatory medications ?3. Muscle relaxants ?4. Membrane stabilizers ?5. Opioids ?6. Physical therapy and/or chiropractic manipulation ?7. Modalities (Heat, ice, etc.) ?8. Invasive techniques such as nerve blocks. ?Sandy Byrd has attained more than 50% relief of the pain from a series of diagnostic injections conducted in separate occasions. ? ?Pain Score: ?Pre-procedure: 7 /10 ?Post-procedure: 4 /10 ? ?   ?Effectiveness:  ?Initial hour after procedure: 25 %  ?Subsequent 4-6 hours post-procedure: 25 %  ?Analgesia past initial 6 hours: 90 % (ongoing)  ?Ongoing improvement:  ?Analgesic:  90% ?Function: Somewhat improved ?ROM: Somewhat improved ? ? ?Pharmacotherapy Assessment  ?Analgesic: Norco 5 mg daily prn #30 usually lasts 10-12 weeks  ? ?Monitoring: ?[place] PMP: PDMP reviewed during this encounter.       ?Pharmacotherapy: No side-effects or adverse reactions reported. ?Compliance:  No problems identified. ?Effectiveness: Clinically acceptable. ? ?Dewayne Shorter, RN   04/02/2022  1:48 PM  Sign when Signing Visit ?Nursing Pain Medication Assessment:  ?Safety precautions to be maintained throughout the outpatient stay will include: orient to surroundings, keep bed in low position, maintain call bell within reach at all times, provide assistance with transfer out of bed and ambulation.  ?Medication Inspection Compliance: Pill count conducted under aseptic conditions, in front of the patient. Neither the pills nor the bottle was removed from the patient's sight at any time. Once count was completed pills were immediately returned to the patient in their original bottle. ? ?Medication: Hydrocodone/APAP ?Pill/Patch Count:  0 of 7 pills remain ?Pill/Patch Appearance: Markings consistent with prescribed medication ?Bottle Appearance: Standard pharmacy container. Clearly labeled. ?Filled Date: 02 / 28 / 2023 ?Last Medication intake:   last week ?  UDS:  ?Summary  ?Date Value Ref Range Status  ?02/08/2022 Note  Final  ?  Comment:  ?  ==================================================================== ?Compliance Drug Analysis, Ur ?==================================================================== ?Test                             Result       Flag       Units ? ?Drug Present and Declared for Prescription Verification ?  Gabapentin                     PRESENT      EXPECTED ?  Citalopram                     PRESENT      EXPECTED ?  Desmethylcitalopram            PRESENT      EXPECTED ?   Desmethylcitalopram is an expected metabolite of citalopram or the ?   enantiomeric form, escitalopram. ? ?  Trazodone                      PRESENT      EXPECTED ?  1,3 chlorophenyl piperazine    PRESENT      EXPECTED ?   1,3-chlorophenyl piperazine is an expected metabolite of trazodone. ? ?  Acetaminophen                  PRESENT      EXPECTED ? ?Drug Present not Declared for Prescription Verification ?  Norhydrocodone                 350          UNEXPECTED ng/mg creat ?   Norhydrocodone is an expected  metabolite of hydrocodone. ? ?Drug Absent but Declared for Prescription Verification ?  Methocarbamol                  Not Detected UNEXPECTED ?  Lidocaine                      Not Detected UNEXPECTED ?   Lidocaine, as indicated in the declared medication list, is not ?   always detected even when used as directed. ? ?==================================================================== ?Test                      Result    Flag   Units      Ref Range ?  Creatinine  22               mg/dL      >=20 ?==================================================================== ?Declared Medications: ? The flagging and interpretation on this report are based on the ? following declared medications.  Unexpected results may arise from ? inaccuracies in the declared medications. ? ? **Note: The testing scope of this panel includes these medications: ? ? Escitalopram (Lexapro) ? Gabapentin (Neurontin) ? Methocarbamol (Robaxin) ? Trazodone (Desyrel) ? ? **Note: The testing scope of this panel does not include small to ? moderate amounts of these reported medications: ? ? Acetaminophen (Tylenol) ? Topical Lidocaine (Lidoderm) ? ? **Note: The testing scope of this panel does not include the ? following reported medications: ? ? Albuterol (Ventolin HFA) ? Celecoxib (Celebrex) ? Cyanocobalamin ? Epinephrine (EpiPen) ? Eye Drop ? Fluticasone (Flonase) ? Folic Acid ? Hydrochlorothiazide ? Levothyroxine ? Metronidazole ? Minocycline ? Simvastatin (Zocor) ? Triamcinolone (Kenalog) ? Triamterene ?==================================================================== ?For clinical consultation, please call [phone]. ?==================================================================== ?  ?  ? ?ROS  ?Constitutional: Denies any fever or chills ?Gastrointestinal: No reported hemesis, hematochezia, vomiting, or acute GI distress ?Musculoskeletal:  Low back pain with radiation into right buttock and right leg has improved after  right L3, L4, L5 RFA ?Neurological: No reported episodes of acute onset apraxia, aphasia, dysarthria, agnosia, amnesia, paralysis, loss of coordination, or loss of consciousness ? ?Medication Review  ?EPINEPHrine, HYDR

## 2022-04-25 ENCOUNTER — Other Ambulatory Visit: Payer: Self-pay | Admitting: Neurology

## 2022-04-25 DIAGNOSIS — Z8673 Personal history of transient ischemic attack (TIA), and cerebral infarction without residual deficits: Secondary | ICD-10-CM

## 2022-05-08 ENCOUNTER — Ambulatory Visit
Admission: RE | Admit: 2022-05-08 | Discharge: 2022-05-08 | Disposition: A | Discharge status: Home / Self Care | Payer: Medicare Other | Source: Ambulatory Visit | Attending: Neurology | Admitting: Neurology

## 2022-05-08 DIAGNOSIS — Z8673 Personal history of transient ischemic attack (TIA), and cerebral infarction without residual deficits: Secondary | ICD-10-CM | POA: Insufficient documentation

## 2022-06-15 ENCOUNTER — Other Ambulatory Visit: Payer: Self-pay | Admitting: Internal Medicine

## 2022-06-15 DIAGNOSIS — Z1231 Encounter for screening mammogram for malignant neoplasm of breast: Secondary | ICD-10-CM

## 2022-06-25 ENCOUNTER — Other Ambulatory Visit: Payer: Self-pay | Admitting: Internal Medicine

## 2022-06-25 DIAGNOSIS — M3509 Sicca syndrome with other organ involvement: Secondary | ICD-10-CM

## 2022-06-25 DIAGNOSIS — Z1231 Encounter for screening mammogram for malignant neoplasm of breast: Secondary | ICD-10-CM

## 2022-06-25 DIAGNOSIS — Z780 Asymptomatic menopausal state: Secondary | ICD-10-CM

## 2022-06-26 ENCOUNTER — Ambulatory Visit: Payer: Medicare Other | Admitting: Student in an Organized Health Care Education/Training Program

## 2022-06-26 ENCOUNTER — Ambulatory Visit
Admission: RE | Admit: 2022-06-26 | Discharge: 2022-06-26 | Disposition: A | Discharge status: Home / Self Care | Payer: Medicare Other | Source: Ambulatory Visit | Attending: Student in an Organized Health Care Education/Training Program | Admitting: Student in an Organized Health Care Education/Training Program

## 2022-06-26 ENCOUNTER — Encounter: Payer: Self-pay | Admitting: Student in an Organized Health Care Education/Training Program

## 2022-06-26 ENCOUNTER — Ambulatory Visit
Admission: RE | Admit: 2022-06-26 | Discharge: 2022-06-26 | Disposition: A | Discharge status: Home / Self Care | Payer: Medicare Other | Attending: Student in an Organized Health Care Education/Training Program | Admitting: Student in an Organized Health Care Education/Training Program

## 2022-06-26 VITALS — BP 146/76 | HR 65 | Temp 97.0°F | Resp 16 | Ht 67.0 in | Wt 214.0 lb

## 2022-06-26 DIAGNOSIS — G894 Chronic pain syndrome: Secondary | ICD-10-CM

## 2022-06-26 DIAGNOSIS — G8929 Other chronic pain: Secondary | ICD-10-CM | POA: Insufficient documentation

## 2022-06-26 DIAGNOSIS — M25561 Pain in right knee: Secondary | ICD-10-CM | POA: Insufficient documentation

## 2022-06-26 DIAGNOSIS — M47816 Spondylosis without myelopathy or radiculopathy, lumbar region: Secondary | ICD-10-CM | POA: Insufficient documentation

## 2022-06-26 MED ORDER — HYDROCODONE-ACETAMINOPHEN 5-325 MG PO TABS: 1.0000 | ORAL_TABLET | Freq: Three times a day (TID) | ORAL | 0 refills | Status: AC | PRN

## 2022-06-26 NOTE — Progress Notes (Signed)
Nursing Pain Medication Assessment:  Safety precautions to be maintained throughout the outpatient stay will include: orient to surroundings, keep bed in low position, maintain call bell within reach at all times, provide assistance with transfer out of bed and ambulation.  Medication Inspection Compliance: Pill count conducted under aseptic conditions, in front of the patient. Neither the pills nor the bottle was removed from the patient's sight at any time. Once count was completed pills were immediately returned to the patient in their original bottle.  Medication: Hydrocodone/APAP Pill/Patch Count:  0 of 30 pills remain Pill/Patch Appearance: Markings consistent with prescribed medication Bottle Appearance: Standard pharmacy container. Clearly labeled. Filled Date: 04 / 03 / 2023 Last Medication intake:  Yesterday

## 2022-07-10 ENCOUNTER — Ambulatory Visit
Admission: RE | Admit: 2022-07-10 | Discharge: 2022-07-10 | Disposition: A | Discharge status: Home / Self Care | Payer: Medicare Other | Source: Ambulatory Visit | Attending: Internal Medicine | Admitting: Internal Medicine

## 2022-07-10 DIAGNOSIS — M3509 Sicca syndrome with other organ involvement: Secondary | ICD-10-CM

## 2022-07-10 DIAGNOSIS — Z1231 Encounter for screening mammogram for malignant neoplasm of breast: Secondary | ICD-10-CM | POA: Insufficient documentation

## 2022-07-10 DIAGNOSIS — Z780 Asymptomatic menopausal state: Secondary | ICD-10-CM | POA: Insufficient documentation

## 2022-07-11 ENCOUNTER — Inpatient Hospital Stay
Admission: RE | Admit: 2022-07-11 | Discharge: 2022-07-11 | Disposition: A | Discharge status: Home / Self Care | Payer: Self-pay | Source: Ambulatory Visit | Attending: *Deleted | Admitting: *Deleted

## 2022-07-11 ENCOUNTER — Other Ambulatory Visit: Payer: Self-pay | Admitting: *Deleted

## 2022-07-11 ENCOUNTER — Other Ambulatory Visit: Payer: Medicare Other

## 2022-07-11 DIAGNOSIS — Z1231 Encounter for screening mammogram for malignant neoplasm of breast: Secondary | ICD-10-CM

## 2022-07-18 ENCOUNTER — Ambulatory Visit
Payer: Medicare Other | Attending: Student in an Organized Health Care Education/Training Program | Admitting: Student in an Organized Health Care Education/Training Program

## 2022-07-18 ENCOUNTER — Encounter: Payer: Self-pay | Admitting: Student in an Organized Health Care Education/Training Program

## 2022-07-18 VITALS — BP 136/77 | HR 64 | Temp 97.7°F | Resp 16 | Ht 66.5 in | Wt 213.0 lb

## 2022-07-18 DIAGNOSIS — G8929 Other chronic pain: Secondary | ICD-10-CM

## 2022-07-18 DIAGNOSIS — M25561 Pain in right knee: Secondary | ICD-10-CM

## 2022-07-18 DIAGNOSIS — M1711 Unilateral primary osteoarthritis, right knee: Secondary | ICD-10-CM

## 2022-07-18 MED ORDER — METHYLPREDNISOLONE ACETATE 40 MG/ML IJ SUSP
40.0000 mg | Freq: Once | INTRAMUSCULAR | Status: AC
  Administered 2022-07-18: 40 mg via INTRA_ARTICULAR
  Filled 2022-07-18: qty 1

## 2022-07-18 MED ORDER — LIDOCAINE HCL 2 % IJ SOLN
20.0000 mL | Freq: Once | INTRAMUSCULAR | Status: AC
  Administered 2022-07-18: 400 mg
  Filled 2022-07-18: qty 20

## 2022-07-18 NOTE — Progress Notes (Signed)
Safety precautions to be maintained throughout the outpatient stay will include: orient to surroundings, keep bed in low position, maintain call bell within reach at all times, provide assistance with transfer out of bed and ambulation.  

## 2022-07-18 NOTE — Progress Notes (Signed)
PROVIDER NOTE: Interpretation of information contained herein should be left to medically-trained personnel. Specific patient instructions are provided elsewhere under "Patient Instructions" section of medical record. This document was created in part using STT-dictation technology, any transcriptional errors that may result from this process are unintentional.  Patient: Sandy Byrd Type: Established DOB: 1955/08/08 MRN: [id number] PCP: Enid Baas, MD  Service: Procedure DOS: 07/18/2022 Setting: Ambulatory Location: Ambulatory outpatient facility Delivery: Face-to-face Provider: Edward Jolly, MD Specialty: Interventional Pain Management Specialty designation: 09 Location: Outpatient facility Ref. Prov.: Enid Baas, MD    Primary Reason for Visit: Interventional Pain Management Treatment. CC: Knee Pain (RIGHT)   Procedure:           Type: Steroid Intra-articular Knee Injection #1  Laterality: Right (-RT) Level/approach: Medial Imaging guidance: None required ([id number]) Anesthesia: Local anesthesia (1-2% Lidocaine)  DOS: 07/18/2022  Performed by: Edward Jolly, MD  Purpose: Diagnostic/Therapeutic Indications: Knee arthralgia associated to osteoarthritis of the knee 1. Chronic pain of right knee   2. Primary osteoarthritis of right knee    NAS-11 score:   Pre-procedure: 7 /10   Post-procedure: 7 /10     Pre-Procedure Preparation  Monitoring: As per clinic protocol.  Risk Assessment: Vitals:  [id number] body mass index is 33.86 kg/m as calculated from the following:   Height as of this encounter: 5' 6.5" (1.689 m).   Weight as of this encounter: 213 lb (96.6 kg)., Rate:67 , BP:(!) 146/62, Resp:16, Temp:(!) 97.2 F (36.2 C), SpO2:98 %  Allergies: She is allergic to cephalexin, cephalosporins, prochlorperazine, shrimp extract allergy skin test, bee venom, latex, other, and silicone.  Precautions: No additional precautions required  Blood-thinner(s): None  at this time  Coagulopathies: Reviewed. None identified.   Active Infection(s): Reviewed. None identified. Sandy Byrd is afebrile   Location setting: Exam room Position: Sitting w/ knee bent 90 degrees Safety Precautions: Patient was assessed for positional comfort and pressure points before starting the procedure. Prepping solution: DuraPrep (Iodine Povacrylex [0.7% available iodine] and Isopropyl Alcohol, 74% w/w) Prep Area: Entire knee region Approach: percutaneous, just above the tibial plateau, lateral to the infrapatellar tendon. Intended target: Intra-articular knee space Materials: Tray: Block Needle(s): Regular Qty: 1/side Length: 1.5-inch Gauge: 25G   Meds ordered this encounter  Medications   methylPREDNISolone acetate (DEPO-MEDROL) injection 40 mg   lidocaine (XYLOCAINE) 2 % (with pres) injection 400 mg    No orders of the defined types were placed in this encounter.    Time-out: 1337 I initiated and conducted the "Time-out" before starting the procedure, as per protocol. The patient was asked to participate by confirming the accuracy of the "Time Out" information. Verification of the correct person, site, and procedure were performed and confirmed by me, the nursing staff, and the patient. "Time-out" conducted as per Joint Commission's Universal Protocol (UP.01.01.01). Procedure checklist: Completed   H&P (Pre-op  Assessment)  Sandy Byrd is a 67 y.o. (year old), female patient, seen today for interventional treatment. She  has no past surgical history on file. Sandy Byrd has a current medication list which includes the following prescription(s): acetaminophen, aspirin ec, celecoxib, cyanocobalamin, cyclosporine, epinephrine, ergocalciferol, escitalopram, fluticasone, folic acid, gabapentin, hydrocodone-acetaminophen, levothyroxine, lidocaine, methocarbamol, metronidazole, minocycline, propylene glycol, simvastatin, trazodone, triamcinolone cream,  triamterene-hydrochlorothiazide, and ventolin hfa. Her primarily concern today is the Knee Pain (RIGHT)  She is allergic to cephalexin, cephalosporins, prochlorperazine, shrimp extract allergy skin test, bee venom, latex, other, and silicone.   Last encounter: My last encounter with her was on 06/26/2022. Pertinent problems:  Sandy Byrd has Lumbar facet arthropathy; Lumbar degenerative disc disease; Chronic pain syndrome; and H/O rotator cuff surgery (bilateral) on their pertinent problem list. Pain Assessment: Severity of Chronic pain is reported as a 7 /10. Location: Knee Right/denies. Onset: More than a month ago. Quality: Sharp. Timing: Constant. Modifying factor(s): ice packs. Vitals:  height is 5' 6.5" (1.689 m) and weight is 213 lb (96.6 kg). Her temporal temperature is 97.7 F (36.5 C). Her blood pressure is 136/77 and her pulse is 64. Her respiration is 16 and oxygen saturation is 96%.   Reason for encounter: "interventional pain management therapy due pain of at least four (4) weeks in duration, with failure to respond and/or inability to tolerate more conservative care.   Site Confirmation: Sandy Byrd was asked to confirm the procedure and laterality before marking the site.  Consent: Before the procedure and under the influence of no sedative(s), amnesic(s), or anxiolytics, the patient was informed of the treatment options, risks and possible complications. To fulfill our ethical and legal obligations, as recommended by the American Medical Association's Code of Ethics, I have informed the patient of my clinical impression; the nature and purpose of the treatment or procedure; the risks, benefits, and possible complications of the intervention; the alternatives, including doing nothing; the risk(s) and benefit(s) of the alternative treatment(s) or procedure(s); and the risk(s) and benefit(s) of doing nothing. The patient was provided information about the general risks and possible  complications associated with the procedure. These may include, but are not limited to: failure to achieve desired goals, infection, bleeding, organ or nerve damage, allergic reactions, paralysis, and death. In addition, the patient was informed of those risks and complications associated to Spine-related procedures, such as failure to decrease pain; infection (i.e.: Meningitis, epidural or intraspinal abscess); bleeding (i.e.: epidural hematoma, subarachnoid hemorrhage, or any other type of intraspinal or peri-dural bleeding); organ or nerve damage (i.e.: Any type of peripheral nerve, nerve root, or spinal cord injury) with subsequent damage to sensory, motor, and/or autonomic systems, resulting in permanent pain, numbness, and/or weakness of one or several areas of the body; allergic reactions; (i.e.: anaphylactic reaction); and/or death. Furthermore, the patient was informed of those risks and complications associated with the medications. These include, but are not limited to: allergic reactions (i.e.: anaphylactic or anaphylactoid reaction(s)); adrenal axis suppression; blood sugar elevation that in diabetics may result in ketoacidosis or comma; water retention that in patients with history of congestive heart failure may result in shortness of breath, pulmonary edema, and decompensation with resultant heart failure; weight gain; swelling or edema; medication-induced neural toxicity; particulate matter embolism and blood vessel occlusion with resultant organ, and/or nervous system infarction; and/or aseptic necrosis of one or more joints. Finally, the patient was informed that Medicine is not an exact science; therefore, there is also the possibility of unforeseen or unpredictable risks and/or possible complications that may result in a catastrophic outcome. The patient indicated having understood very clearly. We have given the patient no guarantees and we have made no promises. Enough time was given to the  patient to ask questions, all of which were answered to the patient's satisfaction. Ms. Neils has indicated that she wanted to continue with the procedure. Attestation: I, the ordering provider, attest that I have discussed with the patient the benefits, risks, side-effects, alternatives, likelihood of achieving goals, and potential problems during recovery for the procedure that I have provided informed consent.  Date  Time: 07/18/2022  1:05 PM   Prophylactic antibiotics  Anti-infectives (From admission, onward)    None      Indication(s): None identified   Description of procedure   Start Time: 1337 hrs  Local Anesthesia: Once the patient was positioned, prepped, and time-out was completed. The target area was identified located. The skin was marked with an approved surgical skin marker. Once marked, the skin (epidermis, dermis, and hypodermis), and deeper tissues (fat, connective tissue and muscle) were infiltrated with a small amount of a short-acting local anesthetic, loaded on a 10cc syringe with a 25G, 1.5-in  Needle. An appropriate amount of time was allowed for local anesthetics to take effect before proceeding to the next step. Local Anesthetic: Lidocaine 1-2% The unused portion of the local anesthetic was discarded in the proper designated containers. Safety Precautions: Aspiration looking for blood return was conducted prior to all injections. At no point did I inject any substances, as a needle was being advanced. Before injecting, the patient was told to immediately notify me if she was experiencing any new onset of "ringing in the ears, or metallic taste in the mouth". No attempts were made at seeking any paresthesias. Safe injection practices and needle disposal techniques used. Medications properly checked for expiration dates. SDV (single dose vial) medications used. After the completion of the procedure, all disposable equipment used was discarded in the proper designated  medical waste containers.  Technical description: Protocol guidelines were followed. After positioning, the target area was identified and prepped in the usual manner. Skin & deeper tissues infiltrated with local anesthetic. Appropriate amount of time allowed to pass for local anesthetics to take effect. Proper needle placement secured. Once satisfactory needle placement was confirmed, I proceeded to inject the desired solution in slow, incremental fashion, intermittently assessing for discomfort or any signs of abnormal or undesired spread of substance. Once completed, the needle was removed and disposed of, as per hospital protocols. The area was cleaned, making sure to leave some of the prepping solution back to take advantage of its long term bactericidal properties.  Aspiration:  Negative         5 cc solution made of 4 cc of 2% lidocaine, 1 cc of methylprednisolone, 40 mg/cc.  Injected into the right knee joint.  Vitals:   07/18/22 1318 07/18/22 1338  BP: (!) 146/62 136/77  Pulse: 67 64  Resp: 16   Temp: (!) 97.2 F (36.2 C) 97.7 F (36.5 C)  TempSrc: Temporal Temporal  SpO2: 98% 96%  Weight: 213 lb (96.6 kg)   Height: 5' 6.5" (1.689 m)     End Time: 1338 hrs    Post-op assessment  Post-procedure Vital Signs:  Pulse/HCG Rate: 64  Temp: 97.7 F (36.5 C) Resp: 16 BP: 136/77 SpO2: 96 %  EBL: None  Complications: No immediate post-treatment complications observed by team, or reported by patient.  Note: The patient tolerated the entire procedure well. A repeat set of vitals were taken after the procedure and the patient was kept under observation following institutional policy, for this type of procedure. Post-procedural neurological assessment was performed, showing return to baseline, prior to discharge. The patient was provided with post-procedure discharge instructions, including a section on how to identify potential problems. Should any problems arise concerning this  procedure, the patient was given instructions to immediately contact us, at any time, without hesitation. In any case, we plan to contact the patient by telephone for a follow-up status report regarding this interventional procedure.  Comments:  No additional relevant information.   Plan of  care  Chronic Opioid Analgesic:  Norco 5 mg daily prn #30 usually lasts 10-12 weeks   Medications administered: We administered methylPREDNISolone acetate and lidocaine.  Follow-up plan:   Return in about 4 weeks (around 08/15/2022) for Post Procedure Evaluation, virtual.      R L3,4,5 RFA 02/26/22; repeat as needed. Right knee steroid injection (depo 40) 07/18/22    Recent Visits Date Type Provider Dept  06/26/22 Office Visit Edward Jolly, MD [facility]  Showing recent visits within past 90 days and meeting all other requirements Today's Visits Date Type Provider Dept  07/18/22 Procedure visit Edward Jolly, MD [facility]  Showing today's visits and meeting all other requirements Future Appointments Date Type Provider Dept  08/13/22 Appointment Edward Jolly, MD [facility]  09/18/22 Appointment Edward Jolly, MD [facility]  Showing future appointments within next 90 days and meeting all other requirements   Disposition: Discharge home  Discharge (Date  Time): 07/18/2022; 1345 hrs.   Primary Care Physician: Enid Baas, MD Location: [facility] Note by: Edward Jolly, MD Date: 07/18/2022; Time: 2:39 PM  DISCLAIMER: Medicine is not an exact science. It has no guarantees or warranties. The decision to proceed with this intervention was based on the information collected from the patient. Conclusions were drawn from the patient's questionnaire, interview, and examination. Because information was provided in large part by the patient, it cannot be guaranteed that it has not been purposely or unconsciously manipulated or  altered. Every effort has been made to obtain as much accurate, relevant, available data as possible. Always take into account that the treatment will also be dependent on availability of resources and existing treatment guidelines, considered by other Pain Management Specialists as being common knowledge and practice, at the time of the intervention. It is also important to point out that variation in procedural techniques and pharmacological choices are the acceptable norm. For Medico-Legal review purposes, the indications, contraindications, technique, and results of the these procedures should only be evaluated, judged and interpreted by a Board-Certified Interventional Pain Specialist with extensive familiarity and expertise in the same exact procedure and technique.

## 2022-07-19 ENCOUNTER — Telehealth: Payer: Self-pay

## 2022-07-19 NOTE — Telephone Encounter (Signed)
Post procedure phone call.  LM 

## 2022-08-13 ENCOUNTER — Ambulatory Visit
Payer: Medicare Other | Attending: Student in an Organized Health Care Education/Training Program | Admitting: Student in an Organized Health Care Education/Training Program

## 2022-08-13 DIAGNOSIS — G8929 Other chronic pain: Secondary | ICD-10-CM

## 2022-08-13 DIAGNOSIS — M47816 Spondylosis without myelopathy or radiculopathy, lumbar region: Secondary | ICD-10-CM

## 2022-08-13 DIAGNOSIS — M25561 Pain in right knee: Secondary | ICD-10-CM | POA: Diagnosis not present

## 2022-08-13 DIAGNOSIS — M1711 Unilateral primary osteoarthritis, right knee: Secondary | ICD-10-CM

## 2022-08-13 DIAGNOSIS — G894 Chronic pain syndrome: Secondary | ICD-10-CM | POA: Diagnosis not present

## 2022-08-13 NOTE — Progress Notes (Signed)
Patient: Sandy Byrd  Service Category: E/M  Provider: Gillis Santa, MD  DOB: [birth year]  DOS: 08/13/2022  Location: Office  MRN: [id number]  Setting: Ambulatory outpatient  Referring Provider: Gladstone Lighter, MD  Type: Established Patient  Specialty: Interventional Pain Management  PCP: Sandy Lighter, MD  Location: Remote location  Delivery: TeleHealth     Virtual Encounter - Pain Management PROVIDER NOTE: Information contained herein reflects review and annotations entered in association with encounter. Interpretation of such information and data should be left to medically-trained personnel. Information provided to patient can be located elsewhere in the medical record under "Patient Instructions". Document created using STT-dictation technology, any transcriptional errors that may result from process are unintentional.    Contact & Pharmacy Preferred: [phone] Home: [phone] (home) Mobile: [phone] (mobile) E-mail: [email or web address] .[place], [place] - [address] [place] [address] Phone: [phone] Fax: [phone]   Pre-screening  Sandy Byrd offered "in-person" vs "virtual" encounter. She indicated preferring virtual for this encounter.   Reason COVID-19*  Social distancing based on CDC and AMA recommendations.   I contacted Sandy Byrd on 08/13/2022 via telephone.      I clearly identified myself as Sandy Santa, MD. I verified that I was speaking with the correct person using two identifiers (Name: Sandy Byrd, and date of birth: 08-18-1955).  Consent I sought verbal advanced consent from Sandy Byrd for virtual visit interactions. I informed Sandy Byrd of possible security and privacy concerns, risks, and limitations associated with providing "not-in-person" medical evaluation and management services. I also informed Sandy Byrd of the availability of "in-person" appointments.  Finally, I informed her that there would be a charge for the virtual visit and that she could be  personally, fully or partially, financially responsible for it. Sandy Byrd expressed understanding and agreed to proceed.   Historic Elements   Sandy Byrd is a 67 y.o. year old, female patient evaluated today after our last contact on 07/18/2022. Sandy Byrd  has no past medical history on file. She also  has no past surgical history on file. Sandy Byrd has a current medication list which includes the following prescription(s): acetaminophen, aspirin ec, celecoxib, cyanocobalamin, cyclosporine, epinephrine, ergocalciferol, escitalopram, fluticasone, folic acid, gabapentin, hydrocodone-acetaminophen, levothyroxine, lidocaine, methocarbamol, metronidazole, minocycline, propylene glycol, simvastatin, trazodone, triamcinolone cream, triamterene-hydrochlorothiazide, and ventolin hfa. She  reports that she quit smoking about 20 years ago. Her smoking use included cigarettes. She has never been exposed to tobacco smoke. She has never used smokeless tobacco. No history on file for alcohol use and drug use. Sandy Byrd is allergic to cephalexin, cephalosporins, prochlorperazine, shrimp extract allergy skin test, bee venom, latex, other, and silicone.   HPI  Today, she is being contacted for a post-procedure assessment.  After right knee steroid injection.  She would also like to discuss an increase in her right axial low back pain related to lumbar facet arthropathy.   Post-procedure evaluation   Type: Steroid Intra-articular Knee Injection #1  Laterality: Right (-RT) Level/approach: Medial Imaging guidance: None required ([id number]) Anesthesia: Local anesthesia (1-2% Lidocaine)  DOS: 07/18/2022  Performed by: Sandy Santa, MD  Purpose: Diagnostic/Therapeutic Indications: Knee arthralgia associated to osteoarthritis of the knee 1. Chronic pain of right knee   2. Primary  osteoarthritis of right knee    NAS-11 score:   Pre-procedure: 7 /10   Post-procedure: 7 /10     Effectiveness:  Initial hour after procedure: 90 %  Subsequent 4-6  hours post-procedure: 90 %  Analgesia past initial 6 hours: 90 %  Ongoing improvement:  Analgesic:  90% Function: Sandy Byrd reports improvement in function ROM: Sandy Byrd reports improvement in ROM   Pharmacotherapy Assessment   Opioid Analgesic: Norco 5 mg daily prn #30 usually lasts 10-12 weeks   Monitoring:  PMP: PDMP not reviewed this encounter.       Pharmacotherapy: No side-effects or adverse reactions reported. Compliance: No problems identified. Effectiveness: Clinically acceptable. Plan: Refer to "POC". UDS:  Summary  Date Value Ref Range Status  02/08/2022 Note  Final    Comment:    ==================================================================== Compliance Drug Analysis, Ur ==================================================================== Test                             Result       Flag       Units  Drug Present and Declared for Prescription Verification   Gabapentin                     PRESENT      EXPECTED   Citalopram                     PRESENT      EXPECTED   Desmethylcitalopram            PRESENT      EXPECTED    Desmethylcitalopram is an expected metabolite of citalopram or the    enantiomeric form, escitalopram.    Trazodone                      PRESENT      EXPECTED   1,3 chlorophenyl piperazine    PRESENT      EXPECTED    1,3-chlorophenyl piperazine is an expected metabolite of trazodone.    Acetaminophen                  PRESENT      EXPECTED  Drug Present not Declared for Prescription Verification   Norhydrocodone                 350          UNEXPECTED ng/mg creat    Norhydrocodone is an expected metabolite of hydrocodone.  Drug Absent but Declared for Prescription Verification   Methocarbamol                  Not Detected UNEXPECTED   Lidocaine                       Not Detected UNEXPECTED    Lidocaine, as indicated in the declared medication list, is not    always detected even when used as directed.  ==================================================================== Test                      Result    Flag   Units      Ref Range   Creatinine              22               mg/dL      >=20 ==================================================================== Declared Medications:  The flagging and interpretation on this report are based on the  following declared medications.  Unexpected results may arise from  inaccuracies in the declared medications.   **Note: The testing scope of this panel includes these  medications:   Escitalopram (Lexapro)  Gabapentin (Neurontin)  Methocarbamol (Robaxin)  Trazodone (Desyrel)   **Note: The testing scope of this panel does not include small to  moderate amounts of these reported medications:   Acetaminophen (Tylenol)  Topical Lidocaine (Lidoderm)   **Note: The testing scope of this panel does not include the  following reported medications:   Albuterol (Ventolin HFA)  Celecoxib (Celebrex)  Cyanocobalamin  Epinephrine (EpiPen)  Eye Drop  Fluticasone (Flonase)  Folic Acid  Hydrochlorothiazide  Levothyroxine  Metronidazole  Minocycline  Simvastatin (Zocor)  Triamcinolone (Kenalog)  Triamterene ==================================================================== For clinical consultation, please call [phone]. ====================================================================    No results found for: "CBDTHCR", "D8THCCBX", "D9THCCBX"   Laboratory Chemistry Profile   Renal No results found for: "BUN", "CREATININE", "LABCREA", "BCR", "GFR", "GFRAA", "GFRNONAA", "LABVMA", "EPIRU", "EPINEPH24HUR", "NOREPRU", "NOREPI24HUR", "DOPARU", "DOPAM24HRUR"  Hepatic No results found for: "AST", "ALT", "ALBUMIN", "ALKPHOS", "HCVAB", "AMYLASE", "LIPASE", "AMMONIA"  Electrolytes No results  found for: "NA", "K", "CL", "CALCIUM", "MG", "PHOS"  Bone No results found for: "VD25OH", "VD125OH2TOT", "UJ8119JY7", "WG9562ZH0", "25OHVITD1", "25OHVITD2", "25OHVITD3", "TESTOFREE", "TESTOSTERONE"  Inflammation (CRP: Acute Phase) (ESR: Chronic Phase) No results found for: "CRP", "ESRSEDRATE", "LATICACIDVEN"       Note: Above Lab results reviewed.  Imaging  MM Outside Films Mammo This examination belongs to an outside facility and is stored here for  comparison purposes only.  Contact the originating outside institution for  any associated report or interpretation. MM Outside Films Mammo This examination belongs to an outside facility and is stored here for  comparison purposes only.  Contact the originating outside institution for  any associated report or interpretation. MM Outside Films Mammo This examination belongs to an outside facility and is stored here for  comparison purposes only.  Contact the originating outside institution for  any associated report or interpretation. MM Outside Films Mammo This examination belongs to an outside facility and is stored here for  comparison purposes only.  Contact the originating outside institution for  any associated report or interpretation.  Assessment  The primary encounter diagnosis was Lumbar facet arthropathy. Diagnoses of Lumbar facet joint syndrome, Chronic pain of right knee, Primary osteoarthritis of right knee, and Chronic pain syndrome were also pertinent to this visit.  Plan of Care   Tishara is doing very well after her right knee intra-articular steroid injection performed 07/18/2022.  She has less knee pain with weightbearing.  We can repeat as needed in the future.  Unfortunately, she is having a flareup of her axial low back pain secondary to lumbar facet arthropathy and lumbar spondylosis.  Her previous lumbar radiofrequency ablation was done 02/26/2022 that provided her with approximately 80% pain relief for approximately 6  months.  Now she is having increase in her right low back pain.  She would like to repeat lumbar radiofrequency ablation as it does help to manage her low back pain and she utilizes less hydrocodone typically when she experiences her RFA.  Follow-up plan:   Return in about 2 weeks (around 08/27/2022) for R L3, 4, 5 RFA #2 , in clinic (PO Valium).     R L3,4,5 RFA 02/26/22; repeat as needed. Right knee steroid injection (depo 40) 07/18/22     Recent Visits Date Type Provider Dept  07/18/22 Procedure visit Sandy Santa, MD [facility]  06/26/22 Office Visit Sandy Santa, MD [facility]  Showing recent visits within past 90 days and meeting all other requirements Today's Visits Date Type Provider Dept  08/13/22 Office Visit  Sandy Santa, MD [facility]  Showing today's visits and meeting all other requirements Future Appointments Date Type Provider Dept  09/18/22 Appointment Sandy Santa, MD [facility]  Showing future appointments within next 90 days and meeting all other requirements  I discussed the assessment and treatment plan with the patient. The patient was provided an opportunity to ask questions and all were answered. The patient agreed with the plan and demonstrated an understanding of the instructions.  Patient advised to call back or seek an in-person evaluation if the symptoms or condition worsens.  Duration of encounter: 63minutes.  Note by: Sandy Santa, MD Date: 08/13/2022; Time: 2:39 PM

## 2022-08-14 NOTE — Patient Instructions (Signed)
______________________________________________________________________  Preparing for your procedure (without sedation)  Procedure appointments are limited to planned procedures: No Prescription Refills. No disability issues will be discussed. No medication changes will be discussed.  Instructions: Food Intake: Avoid eating anything for at least 4 hours prior to your procedure. Transportation: Unless otherwise stated by your physician, bring a driver. Morning Medicines: Take all of your scheduled morning medications. If you take heart medicine, except for blood thinners, do not forget to take it the morning of the procedure. If your Diastolic (lower reading) is above 100 mmHg, elective cases will be cancelled/rescheduled. Blood thinners: These will need to be stopped for procedures. Notify our staff if you are taking any blood thinners. Depending on which one you take, there will be specific instructions on how and when to stop it. Diabetics on insulin: Notify the staff so that you can be scheduled 1st case in the morning. If your diabetes requires high dose insulin, take only  of your normal insulin dose the morning of the procedure and notify the staff that you have done so. Preventing infections: Shower with an antibacterial soap the morning of your procedure.  Build-up your immune system: Take 1000 mg of Vitamin C with every meal (3 times a day) the day prior to your procedure. Antibiotics: Inform the staff if you have a condition or reason that requires you to take antibiotics before dental procedures. Pregnancy: If you are pregnant, call and cancel the procedure. Sickness: If you have a cold, fever, or any active infections, call and cancel the procedure. Arrival: You must be in the facility at least 30 minutes prior to your scheduled procedure. Children: Do not bring any children with you. Dress appropriately: There is always a possibility that your clothing may get soiled. Valuables:  Do not bring any jewelry or valuables.  Reasons to call and reschedule or cancel your procedure: (Following these recommendations will minimize the risk of a serious complication.) Surgeries: Avoid having procedures within 2 weeks of any surgery. (Avoid for 2 weeks before or after any surgery). Flu Shots: Avoid having procedures within 2 weeks of a flu shots or . (Avoid for 2 weeks before or after immunizations). Barium: Avoid having a procedure within 7-10 days after having had a radiological study involving the use of radiological contrast. (Myelograms, Barium swallow or enema study). Heart attacks: Avoid any elective procedures or surgeries for the initial 6 months after a "Myocardial Infarction" (Heart Attack). Blood thinners: It is imperative that you stop these medications before procedures. Let us know if you if you take any blood thinner.  Infection: Avoid procedures during or within two weeks of an infection (including chest colds or gastrointestinal problems). Symptoms associated with infections include: Localized redness, fever, chills, night sweats or profuse sweating, burning sensation when voiding, cough, congestion, stuffiness, runny nose, sore throat, diarrhea, nausea, vomiting, cold or Flu symptoms, recent or current infections. It is specially important if the infection is over the area that we intend to treat. Heart and lung problems: Symptoms that may suggest an active cardiopulmonary problem include: cough, chest pain, breathing difficulties or shortness of breath, dizziness, ankle swelling, uncontrolled high or unusually low blood pressure, and/or palpitations. If you are experiencing any of these symptoms, cancel your procedure and contact your primary care physician for an evaluation.  Remember:  Regular Business hours are:  Monday to Thursday 8:00 AM to 4:00 PM  Provider's Schedule: Francisco Naveira, MD:  Procedure days: Tuesday and Thursday 7:30 AM to 4:00 PM    Bilal  Lateef, MD:  Procedure days: Monday and Wednesday 7:30 AM to 4:00 PM ______________________________________________________________________   

## 2022-08-29 ENCOUNTER — Other Ambulatory Visit: Payer: Self-pay

## 2022-08-29 ENCOUNTER — Ambulatory Visit
Payer: Medicare Other | Attending: Student in an Organized Health Care Education/Training Program | Admitting: Student in an Organized Health Care Education/Training Program

## 2022-08-29 ENCOUNTER — Encounter: Payer: Self-pay | Admitting: Student in an Organized Health Care Education/Training Program

## 2022-08-29 ENCOUNTER — Ambulatory Visit
Admission: RE | Admit: 2022-08-29 | Discharge: 2022-08-29 | Disposition: A | Discharge status: Home / Self Care | Payer: Medicare Other | Source: Ambulatory Visit | Attending: Student in an Organized Health Care Education/Training Program | Admitting: Student in an Organized Health Care Education/Training Program

## 2022-08-29 DIAGNOSIS — G894 Chronic pain syndrome: Secondary | ICD-10-CM | POA: Diagnosis present

## 2022-08-29 DIAGNOSIS — M47816 Spondylosis without myelopathy or radiculopathy, lumbar region: Secondary | ICD-10-CM | POA: Insufficient documentation

## 2022-08-29 MED ORDER — DIAZEPAM 5 MG PO TABS
5.0000 mg | ORAL_TABLET | ORAL | Status: AC
  Administered 2022-08-29: 5 mg via ORAL

## 2022-08-29 MED ORDER — DEXAMETHASONE SODIUM PHOSPHATE 10 MG/ML IJ SOLN
10.0000 mg | Freq: Once | INTRAMUSCULAR | Status: AC
  Administered 2022-08-29: 10 mg

## 2022-08-29 MED ORDER — LIDOCAINE HCL 2 % IJ SOLN
INTRAMUSCULAR | Status: AC
  Filled 2022-08-29: qty 20

## 2022-08-29 MED ORDER — DIAZEPAM 5 MG PO TABS
ORAL_TABLET | ORAL | Status: AC
  Filled 2022-08-29: qty 1

## 2022-08-29 MED ORDER — ROPIVACAINE HCL 2 MG/ML IJ SOLN
INTRAMUSCULAR | Status: AC
  Filled 2022-08-29: qty 20

## 2022-08-29 MED ORDER — ROPIVACAINE HCL 2 MG/ML IJ SOLN
9.0000 mL | Freq: Once | INTRAMUSCULAR | Status: AC
  Administered 2022-08-29: 9 mL via PERINEURAL

## 2022-08-29 MED ORDER — DEXAMETHASONE SODIUM PHOSPHATE 10 MG/ML IJ SOLN
INTRAMUSCULAR | Status: AC
  Filled 2022-08-29: qty 1

## 2022-08-29 MED ORDER — LIDOCAINE HCL 2 % IJ SOLN
20.0000 mL | Freq: Once | INTRAMUSCULAR | Status: AC
  Administered 2022-08-29: 400 mg

## 2022-08-29 NOTE — Patient Instructions (Signed)

## 2022-08-29 NOTE — Progress Notes (Signed)
Safety precautions to be maintained throughout the outpatient stay will include: orient to surroundings, keep bed in low position, maintain call bell within reach at all times, provide assistance with transfer out of bed and ambulation.  

## 2022-08-29 NOTE — Progress Notes (Signed)
PROVIDER NOTE: Interpretation of information contained herein should be left to medically-trained personnel. Specific patient instructions are provided elsewhere under "Patient Instructions" section of medical record. This document was created in part using STT-dictation technology, any transcriptional errors that may result from this process are unintentional.  Patient: Sandy Byrd Type: Established DOB: 09/28/1955 MRN: [id number] PCP: Enid Baas, MD  Service: Procedure DOS: 08/29/2022 Setting: Ambulatory Location: Ambulatory outpatient facility Delivery: Face-to-face Provider: Edward Jolly, MD Specialty: Interventional Pain Management Specialty designation: 09 Location: Outpatient facility Ref. Prov.: Edward Jolly, MD    Primary Reason for Visit: Interventional Pain Management Treatment. CC: Back Pain (Low and worse on the right)  Procedure #1:   Type: Lumbar Facet, Medial Branch Radiofrequency Ablation (RFA) #2  Laterality: Right  Level: L3, L4, L5, Medial Branch Level(s). These levels will denervate the L3-4 and L5-S1 lumbar facet joints.  Imaging: Fluoroscopic guidance Anesthesia: Local anesthesia (1-2% Lidocaine) Anxiolysis: Oral Valium 5 mg PO Sedation:  Minimal . DOS: 08/29/2022  Performed by: Edward Jolly, MD  Purpose: Therapeutic/Palliative Indications: Low back pain severe enough to impact quality of life or function. Indications: 1. Lumbar facet arthropathy   2. Lumbar facet joint syndrome   3. Chronic pain syndrome    Sandy Byrd has been dealing with the above chronic pain for longer than three months and has either failed to respond, was unable to tolerate, or simply did not get enough benefit from other more conservative therapies including, but not limited to: 1. Over-the-counter medications 2. Anti-inflammatory medications 3. Muscle relaxants 4. Membrane stabilizers 5. Opioids 6. Physical therapy and/or chiropractic manipulation 7. Modalities  (Heat, ice, etc.) 8. Invasive techniques such as nerve blocks. Sandy Byrd has attained more than 50% relief of the pain from a series of diagnostic injections conducted in separate occasions.  Pain Score: Pre-procedure: 8 /10 Post-procedure: 0-No pain/10     Position / Prep / Materials:  Position: Prone  Prep solution: DuraPrep (Iodine Povacrylex [0.7% available iodine] and Isopropyl Alcohol, 74% w/w) Prep Area: Entire Lumbosacral Region (Lower back from mid-thoracic region to end of tailbone and from flank to flank.) Materials:  Tray: RFA (Radiofrequency) tray Needle(s):  Type: RFA (Teflon-coated radiofrequency ablation needles) Gauge (G): 22  Length: Regular (10cm) Qty: 3  Pre-op H&P Assessment:  Sandy Byrd is a 67 y.o. (year old), female patient, seen today for interventional treatment. She  has no past surgical history on file. Sandy Byrd has a current medication list which includes the following prescription(s): acetaminophen, aspirin ec, celecoxib, cyanocobalamin, cyclosporine, epinephrine, ergocalciferol, escitalopram, fluticasone, folic acid, gabapentin, hydrocodone-acetaminophen, levothyroxine, lidocaine, methocarbamol, metronidazole, minocycline, propylene glycol, simvastatin, trazodone, triamcinolone cream, triamterene-hydrochlorothiazide, and ventolin hfa. Her primarily concern today is the Back Pain (Low and worse on the right)  Initial Vital Signs:  Pulse/HCG Rate: (!) 59ECG Heart Rate: (!) 59 Temp:  (!) 97.3 F (36.3 C) Resp: 16 BP: 133/77 SpO2: 97 %  BMI: Estimated body mass index is 33.2 kg/m as calculated from the following:   Height as of this encounter: 5\' 7"  (1.702 m).   Weight as of this encounter: 212 lb (96.2 kg).  Risk Assessment: Allergies: Reviewed. She is allergic to cephalexin, cephalosporins, prochlorperazine, shrimp extract allergy skin test, bee venom, latex, other, and silicone.  Allergy Precautions: None required Coagulopathies:  Reviewed. None identified.  Blood-thinner therapy: None at this time Active Infection(s): Reviewed. None identified. Sandy Byrd is afebrile  Site Confirmation: Sandy Byrd was asked to confirm the procedure and laterality before marking the site Procedure  checklist: Completed Consent: Before the procedure and under the influence of no sedative(s), amnesic(s), or anxiolytics, the patient was informed of the treatment options, risks and possible complications. To fulfill our ethical and legal obligations, as recommended by the American Medical Association's Code of Ethics, I have informed the patient of my clinical impression; the nature and purpose of the treatment or procedure; the risks, benefits, and possible complications of the intervention; the alternatives, including doing nothing; the risk(s) and benefit(s) of the alternative treatment(s) or procedure(s); and the risk(s) and benefit(s) of doing nothing. The patient was provided information about the general risks and possible complications associated with the procedure. These may include, but are not limited to: failure to achieve desired goals, infection, bleeding, organ or nerve damage, allergic reactions, paralysis, and death. In addition, the patient was informed of those risks and complications associated to Spine-related procedures, such as failure to decrease pain; infection (i.e.: Meningitis, epidural or intraspinal abscess); bleeding (i.e.: epidural hematoma, subarachnoid hemorrhage, or any other type of intraspinal or peri-dural bleeding); organ or nerve damage (i.e.: Any type of peripheral nerve, nerve root, or spinal cord injury) with subsequent damage to sensory, motor, and/or autonomic systems, resulting in permanent pain, numbness, and/or weakness of one or several areas of the body; allergic reactions; (i.e.: anaphylactic reaction); and/or death. Furthermore, the patient was informed of those risks and complications associated  with the medications. These include, but are not limited to: allergic reactions (i.e.: anaphylactic or anaphylactoid reaction(s)); adrenal axis suppression; blood sugar elevation that in diabetics may result in ketoacidosis or comma; water retention that in patients with history of congestive heart failure may result in shortness of breath, pulmonary edema, and decompensation with resultant heart failure; weight gain; swelling or edema; medication-induced neural toxicity; particulate matter embolism and blood vessel occlusion with resultant organ, and/or nervous system infarction; and/or aseptic necrosis of one or more joints. Finally, the patient was informed that Medicine is not an exact science; therefore, there is also the possibility of unforeseen or unpredictable risks and/or possible complications that may result in a catastrophic outcome. The patient indicated having understood very clearly. We have given the patient no guarantees and we have made no promises. Enough time was given to the patient to ask questions, all of which were answered to the patient's satisfaction. Ms. Dimalanta has indicated that she wanted to continue with the procedure. Attestation: I, the ordering provider, attest that I have discussed with the patient the benefits, risks, side-effects, alternatives, likelihood of achieving goals, and potential problems during recovery for the procedure that I have provided informed consent. Date  Time: 08/29/2022 10:04 AM  Pre-Procedure Preparation:  Monitoring: As per clinic protocol. Respiration, ETCO2, SpO2, BP, heart rate and rhythm monitor placed and checked for adequate function Safety Precautions: Patient was assessed for positional comfort and pressure points before starting the procedure. Time-out: I initiated and conducted the "Time-out" before starting the procedure, as per protocol. The patient was asked to participate by confirming the accuracy of the "Time Out" information.  Verification of the correct person, site, and procedure were performed and confirmed by me, the nursing staff, and the patient. "Time-out" conducted as per Joint Commission's Universal Protocol (UP.01.01.01). Time: 1042  Description of Procedure:          Laterality: Right Levels:  L3, L4, L5,Medial Branch Level(s), at the L3-4 and L5-S1 lumbar facet joints. Safety Precautions: Aspiration looking for blood return was conducted prior to all injections. At no point did we inject any  substances, as a needle was being advanced. Before injecting, the patient was told to immediately notify me if she was experiencing any new onset of "ringing in the ears, or metallic taste in the mouth". No attempts were made at seeking any paresthesias. Safe injection practices and needle disposal techniques used. Medications properly checked for expiration dates. SDV (single dose vial) medications used. After the completion of the procedure, all disposable equipment used was discarded in the proper designated medical waste containers. Local Anesthesia: Protocol guidelines were followed. The patient was positioned over the fluoroscopy table. The area was prepped in the usual manner. The time-out was completed. The target area was identified using fluoroscopy. A 12-in long, straight, sterile hemostat was used with fluoroscopic guidance to locate the targets for each level blocked. Once located, the skin was marked with an approved surgical skin marker. Once all sites were marked, the skin (epidermis, dermis, and hypodermis), as well as deeper tissues (fat, connective tissue and muscle) were infiltrated with a small amount of a short-acting local anesthetic, loaded on a 10cc syringe with a 25G, 1.5-in  Needle. An appropriate amount of time was allowed for local anesthetics to take effect before proceeding to the next step. Technical description of process:  Radiofrequency Ablation (RFA) L3 Medial Branch Nerve RFA: The target area  for the L3 medial branch is at the junction of the postero-lateral aspect of the superior articular process and the superior, posterior, and medial edge of the transverse process of L4. Under fluoroscopic guidance, a Radiofrequency needle was inserted until contact was made with os over the superior postero-lateral aspect of the pedicular shadow (target area). Sensory and motor testing was conducted to properly adjust the position of the needle. Once satisfactory placement of the needle was achieved, the numbing solution was slowly injected after negative aspiration for blood. 2.0 mL of the nerve block solution was injected without difficulty or complication. After waiting for at least 3 minutes, the ablation was performed. Once completed, the needle was removed intact. L4 Medial Branch Nerve RFA: The target area for the L4 medial branch is at the junction of the postero-lateral aspect of the superior articular process and the superior, posterior, and medial edge of the transverse process of L5. Under fluoroscopic guidance, a Radiofrequency needle was inserted until contact was made with os over the superior postero-lateral aspect of the pedicular shadow (target area). Sensory and motor testing was conducted to properly adjust the position of the needle. Once satisfactory placement of the needle was achieved, the numbing solution was slowly injected after negative aspiration for blood. 2.0 mL of the nerve block solution was injected without difficulty or complication. After waiting for at least 3 minutes, the ablation was performed. Once completed, the needle was removed intact. L5 Medial Branch Nerve RFA: The target area for the L5 medial branch is at the junction of the postero-lateral aspect of the superior articular process of S1 and the superior, posterior, and medial edge of the sacral ala. Under fluoroscopic guidance, a Radiofrequency needle was inserted until contact was made with os over the superior  postero-lateral aspect of the pedicular shadow (target area). Sensory and motor testing was conducted to properly adjust the position of the needle. Once satisfactory placement of the needle was achieved, the numbing solution was slowly injected after negative aspiration for blood. 2.0 mL of the nerve block solution was injected without difficulty or complication. After waiting for at least 3 minutes, the ablation was performed. Once completed, the needle was  removed intact. Radiofrequency lesioning (ablation):  Radiofrequency Generator: Medtronic AccurianTM AG 1000 RF Generator Sensory Stimulation Parameters: 50 Hz was used to locate & identify the nerve, making sure that the needle was positioned such that there was no sensory stimulation below 0.3 V or above 0.7 V. Motor Stimulation Parameters: 2 Hz was used to evaluate the motor component. Care was taken not to lesion any nerves that demonstrated motor stimulation of the lower extremities at an output of less than 2.5 times that of the sensory threshold, or a maximum of 2.0 V. Lesioning Technique Parameters: Standard Radiofrequency settings. (Not bipolar or pulsed.) Temperature Settings: 80 degrees C Lesioning time: 60 seconds Intra-operative Compliance: Compliant  6 cc solution made of 5 cc of 0.2% ropivacaine, 1 cc of Decadron 10 mg/cc.  2 cc injected at each level above on the right after sensorimotor testing, prior to lesioning.  Once the entire procedure was completed, the treated area was cleaned, making sure to leave some of the prepping solution back to take advantage of its long term bactericidal properties.    Illustration of the posterior view of the lumbar spine and the posterior neural structures. Laminae of L2 through S1 are labeled. DPRL5, dorsal primary ramus of L5; DPRS1, dorsal primary ramus of S1; DPR3, dorsal primary ramus of L3; FJ, facet (zygapophyseal) joint L3-L4; I, inferior articular process of L4; LB1, lateral branch of  dorsal primary ramus of L1; IAB, inferior articular branches from L3 medial branch (supplies L4-L5 facet joint); IBP, intermediate branch plexus; MB3, medial branch of dorsal primary ramus of L3; NR3, third lumbar nerve root; S, superior articular process of L5; SAB, superior articular branches from L4 (supplies L4-5 facet joint also); TP3, transverse process of L3.  Vitals:   08/29/22 1045 08/29/22 1050 08/29/22 1055 08/29/22 1100  BP: (!) 152/100 (!) 151/99 (!) 148/93 (!) 151/94  Pulse:      Resp: 13 15 16 15   Temp:      TempSrc:      SpO2: 96% 97% 97% 97%  Weight:      Height:       Start Time: 1042 hrs. End Time: 1059 hrs.  Imaging Guidance (Spinal):          Type of Imaging Technique: Fluoroscopy Guidance (Spinal) Indication(s): Assistance in needle guidance and placement for procedures requiring needle placement in or near specific anatomical locations not easily accessible without such assistance. Exposure Time: Please see nurses notes. Contrast: None used. Fluoroscopic Guidance: I was personally present during the use of fluoroscopy. "Tunnel Vision Technique" used to obtain the best possible view of the target area. Parallax error corrected before commencing the procedure. "Direction-depth-direction" technique used to introduce the needle under continuous pulsed fluoroscopy. Once target was reached, antero-posterior, oblique, and lateral fluoroscopic projection used confirm needle placement in all planes. Images permanently stored in EMR. Interpretation: No contrast injected. I personally interpreted the imaging intraoperatively. Adequate needle placement confirmed in multiple planes. Permanent images saved into the patient's record.  Post-operative Assessment:  Post-procedure Vital Signs:  Pulse/HCG Rate: (!) 59(!) 54 Temp:  (!) 97.3 F (36.3 C) Resp: 15 BP: (!) 151/94 SpO2: 97 %  EBL: None  Complications: No immediate post-treatment complications observed by team, or  reported by patient.  Note: The patient tolerated the entire procedure well. A repeat set of vitals were taken after the procedure and the patient was kept under observation following institutional policy, for this type of procedure. Post-procedural neurological assessment was performed, showing return to  baseline, prior to discharge. The patient was provided with post-procedure discharge instructions, including a section on how to identify potential problems. Should any problems arise concerning this procedure, the patient was given instructions to immediately contact us, at any time, without hesitation. In any case, we plan to contact the patient by telephone for a follow-up status report regarding this interventional procedure.  Comments:  No additional relevant information.  5 out of 5 strength bilateral lower extremity: Plantar flexion, dorsiflexion, knee flexion, knee extension.   Plan of Care  Orders:  Orders Placed This Encounter  Procedures   DG PAIN [facility] C-ARM 1-60 MIN NO REPORT    Intraoperative interpretation by procedural physician at [facility].    Standing Status:   Standing    Number of Occurrences:   1    Order Specific Question:   Reason for exam:    Answer:   Assistance in needle guidance and placement for procedures requiring needle placement in or near specific anatomical locations not easily accessible without such assistance.   Medications ordered for procedure: Meds ordered this encounter  Medications   lidocaine (XYLOCAINE) 2 % (with pres) injection 400 mg   diazepam (VALIUM) tablet 5 mg    Make sure Flumazenil is available in the pyxis when using this medication. If oversedation occurs, administer 0.2 mg IV over 15 sec. If after 45 sec no response, administer 0.2 mg again over 1 min; may repeat at 1 min intervals; not to exceed 4 doses (1 mg)   dexamethasone (DECADRON) injection 10 mg   ropivacaine (PF) 2 mg/mL (0.2%) (NAROPIN) injection 9 mL    Medications administered: We administered lidocaine, diazepam, dexamethasone, and ropivacaine (PF) 2 mg/mL (0.2%).  See the medical record for exact dosing, route, and time of administration.  Follow-up plan:   Return for Keep sch. appt.       R L3,4,5 RFA 02/26/22, 08/29/22  Recent Visits Date Type Provider Dept  08/13/22 Office Visit Edward JollyLateef, Nakeya Adinolfi, MD [facility]  07/18/22 Procedure visit Edward JollyLateef, Elisah Parmer, MD [facility]  06/26/22 Office Visit Edward JollyLateef, Devlin Brink, MD [facility]  Showing recent visits within past 90 days and meeting all other requirements Today's Visits Date Type Provider Dept  08/29/22 Procedure visit Edward JollyLateef, Girtrude Enslin, MD [facility]  Showing today's visits and meeting all other requirements Future Appointments Date Type Provider Dept  09/18/22 Appointment Edward JollyLateef, Leonarda Leis, MD [facility]  Showing future appointments within next 90 days and meeting all other requirements  Disposition: Discharge home  Discharge (Date  Time): 08/29/2022; 1107 hrs.   Primary Care Physician: Enid BaasKalisetti, Radhika, MD [facility] Note by: Edward JollyBilal Davion Meara, MD Date: 08/29/2022; Time: 11:21 AM  Disclaimer:  Medicine is not an exact science. The only guarantee in medicine is that nothing is guaranteed. It is important to note that the decision to proceed with this intervention was based on the information collected from the patient. The Data and conclusions were drawn from the patient's questionnaire, the interview, and the physical examination. Because the information was provided in large part by the patient, it cannot be guaranteed that it has not been purposely or unconsciously manipulated. Every effort has been made to obtain as much relevant data as possible for this evaluation. It is important to note that the conclusions that lead to this procedure are derived in large part from the available data. Always take into  account that the treatment will also be dependent on availability of  resources and existing treatment guidelines, considered by other Pain Management Practitioners as being common knowledge and practice, at the time of the intervention. For Medico-Legal purposes, it is also important to point out that variation in procedural techniques and pharmacological choices are the acceptable norm. The indications, contraindications, technique, and results of the above procedure should only be interpreted and judged by a Board-Certified Interventional Pain Specialist with extensive familiarity and expertise in the same exact procedure and technique.

## 2022-08-30 ENCOUNTER — Telehealth: Payer: Self-pay

## 2022-08-30 NOTE — Telephone Encounter (Signed)
Post procedure phone call.  Patient states she is doing ok.  

## 2022-09-18 ENCOUNTER — Ambulatory Visit
Payer: Medicare Other | Attending: Student in an Organized Health Care Education/Training Program | Admitting: Student in an Organized Health Care Education/Training Program

## 2022-09-18 ENCOUNTER — Encounter: Payer: Self-pay | Admitting: Student in an Organized Health Care Education/Training Program

## 2022-09-18 ENCOUNTER — Other Ambulatory Visit: Payer: Self-pay

## 2022-09-18 VITALS — BP 139/76 | HR 52 | Temp 96.8°F | Resp 16 | Ht 67.0 in | Wt 212.0 lb

## 2022-09-18 DIAGNOSIS — M25561 Pain in right knee: Secondary | ICD-10-CM

## 2022-09-18 DIAGNOSIS — M1711 Unilateral primary osteoarthritis, right knee: Secondary | ICD-10-CM | POA: Diagnosis not present

## 2022-09-18 DIAGNOSIS — G894 Chronic pain syndrome: Secondary | ICD-10-CM

## 2022-09-18 DIAGNOSIS — G8929 Other chronic pain: Secondary | ICD-10-CM | POA: Diagnosis present

## 2022-09-18 DIAGNOSIS — M47816 Spondylosis without myelopathy or radiculopathy, lumbar region: Secondary | ICD-10-CM | POA: Diagnosis not present

## 2022-09-18 DIAGNOSIS — M5136 Other intervertebral disc degeneration, lumbar region: Secondary | ICD-10-CM

## 2022-09-18 MED ORDER — HYDROCODONE-ACETAMINOPHEN 5-325 MG PO TABS: 1.0000 | ORAL_TABLET | Freq: Two times a day (BID) | ORAL | 0 refills | Status: DC | PRN

## 2022-09-18 MED ORDER — HYDROCODONE-ACETAMINOPHEN 5-325 MG PO TABS: 1.0000 | ORAL_TABLET | Freq: Two times a day (BID) | ORAL | 0 refills | Status: AC | PRN

## 2022-09-18 NOTE — Progress Notes (Signed)
Nursing Pain Medication Assessment:  Safety precautions to be maintained throughout the outpatient stay will include: orient to surroundings, keep bed in low position, maintain call bell within reach at all times, provide assistance with transfer out of bed and ambulation.  Medication Inspection Compliance:  empty bottle  Medication: Hydrocodone/APAP Pill/Patch Count:  0 of 90 pills remain Pill/Patch Appearance: Markings consistent with prescribed medication Bottle Appearance: Standard pharmacy container. Clearly labeled. Filled Date: 06 / 27 / 2023 Last Medication intake:   several days ago

## 2022-09-18 NOTE — Progress Notes (Signed)
PROVIDER NOTE: Information contained herein reflects review and annotations entered in association with encounter. Interpretation of such information and data should be left to medically-trained personnel. Information provided to patient can be located elsewhere in the medical record under "Patient Instructions". Document created using STT-dictation technology, any transcriptional errors that may result from process are unintentional.    Patient: Sandy Byrd  Service Category: E/M  Provider: Gillis Santa, MD  DOB: 1955-11-23  DOS: 09/18/2022  Specialty: Interventional Pain Management  MRN: [id number]  Setting: Ambulatory outpatient  PCP: Gladstone Lighter, MD  Type: Established Patient    Referring Provider: Gladstone Lighter, MD  Location: Office  Delivery: Face-to-face     HPI  [name], a 67 y.o. year old female, is here today because of her Lumbar facet arthropathy [M47.816]. [name]'s primary complain today is Back Pain (Lumbar ) and Shoulder Pain (Bilateral s/p rotator cuff repair ) Last encounter: My last encounter with her was on 08/29/22 Pertinent problems: [name] has Lumbar facet arthropathy; Lumbar degenerative disc disease; Chronic pain syndrome; and H/O rotator cuff surgery (bilateral) on their pertinent problem list. Pain Assessment: Severity of Chronic pain is reported as a 2 /10. Location: Back Lower, Left, Right/into hips and legs bilaterally. Onset: More than a month ago. Quality: Discomfort, Constant, Burning, Throbbing, Other (Comment) (itches). Timing: Constant. Modifying factor(s): topical analgesics, massager, voltaren gel  pain medications as a last resort. Vitals:  height is 5\' 7"  (1.702 m) and weight is 212 lb (96.2 kg). Her temporal temperature is 96.8 F (36 C) (abnormal). Her blood pressure is 139/76 and her pulse is 52 (abnormal). Her respiration is 16 and oxygen saturation is 100%.   Reason for encounter: both, medication management and  post-procedure evaluation and assessment.   Patient did receive benefit after her previous RFA although states that she still having intermittent bouts of breakthrough pain.  She states that she is doing more and is more active so that could be a reason.  She states that she is taking 1 to 1.5 tablets a day to help manage her pain.  We will adjust her prescription to reflect that.  No further dose escalation beyond this.  Consider repeating RFA in 3 months.  Post-procedure evaluation   Type: Lumbar Facet, Medial Branch Radiofrequency Ablation (RFA) #2  Laterality: Right  Level: L3, L4, L5, Medial Branch Level(s). These levels will denervate the L3-4 and L5-S1 lumbar facet joints.  Imaging: Fluoroscopic guidance Anesthesia: Local anesthesia (1-2% Lidocaine) Anxiolysis: Oral Valium 5 mg PO Sedation:  Minimal . DOS: 08/29/2022  Performed by: Gillis Santa, MD  Purpose: Therapeutic/Palliative Indications: Low back pain severe enough to impact quality of life or function. Indications: 1. Lumbar facet arthropathy   2. Lumbar facet joint syndrome   3. Chronic pain syndrome    [name] has been dealing with the above chronic pain for longer than three months and has either failed to respond, was unable to tolerate, or simply did not get enough benefit from other more conservative therapies including, but not limited to: 1. Over-the-counter medications 2. Anti-inflammatory medications 3. Muscle relaxants 4. Membrane stabilizers 5. Opioids 6. Physical therapy and/or chiropractic manipulation 7. Modalities (Heat, ice, etc.) 8. Invasive techniques such as nerve blocks. [name] has attained more than 50% relief of the pain from a series of diagnostic injections conducted in separate occasions.  Pain Score: Pre-procedure: 8 /10 Post-procedure: 0-No pain/10     Effectiveness:  Initial hour after procedure: 100 %  Subsequent 4-6 hours post-procedure: 100 %  Analgesia past initial  6 hours: 70 %  Ongoing improvement:  Analgesic:  70-75% Function: [name] reports improvement in function ROM: [name] reports improvement in ROM   Pharmacotherapy Assessment  Analgesic:Norco 5 mg BID prn #45/month  Monitoring: [place] PMP: PDMP reviewed during this encounter.       Pharmacotherapy: No side-effects or adverse reactions reported. Compliance: No problems identified. Effectiveness: Clinically acceptable.  Janett Billow, RN  09/18/2022  9:28 AM  Sign when Signing Visit Nursing Pain Medication Assessment:  Safety precautions to be maintained throughout the outpatient stay will include: orient to surroundings, keep bed in low position, maintain call bell within reach at all times, provide assistance with transfer out of bed and ambulation.  Medication Inspection Compliance:  empty bottle  Medication: Hydrocodone/APAP Pill/Patch Count:  0 of 90 pills remain Pill/Patch Appearance: Markings consistent with prescribed medication Bottle Appearance: Standard pharmacy container. Clearly labeled. Filled Date: 06 / 27 / 2023 Last Medication intake:   several days ago     UDS:  Summary  Date Value Ref Range Status  02/08/2022 Note  Final    Comment:    ==================================================================== Compliance Drug Analysis, Ur ==================================================================== Test                             Result       Flag       Units  Drug Present and Declared for Prescription Verification   Gabapentin                     PRESENT      EXPECTED   Citalopram                     PRESENT      EXPECTED   Desmethylcitalopram            PRESENT      EXPECTED    Desmethylcitalopram is an expected metabolite of citalopram or the    enantiomeric form, escitalopram.    Trazodone                      PRESENT      EXPECTED   1,3 chlorophenyl piperazine    PRESENT      EXPECTED    1,3-chlorophenyl piperazine is an expected  metabolite of trazodone.    Acetaminophen                  PRESENT      EXPECTED  Drug Present not Declared for Prescription Verification   Norhydrocodone                 350          UNEXPECTED ng/mg creat    Norhydrocodone is an expected metabolite of hydrocodone.  Drug Absent but Declared for Prescription Verification   Methocarbamol                  Not Detected UNEXPECTED   Lidocaine                      Not Detected UNEXPECTED    Lidocaine, as indicated in the declared medication list, is not    always detected even when used as directed.  ==================================================================== Test  Result    Flag   Units      Ref Range   Creatinine              22               mg/dL      >=20 ==================================================================== Declared Medications:  The flagging and interpretation on this report are based on the  following declared medications.  Unexpected results may arise from  inaccuracies in the declared medications.   **Note: The testing scope of this panel includes these medications:   Escitalopram (Lexapro)  Gabapentin (Neurontin)  Methocarbamol (Robaxin)  Trazodone (Desyrel)   **Note: The testing scope of this panel does not include small to  moderate amounts of these reported medications:   Acetaminophen (Tylenol)  Topical Lidocaine (Lidoderm)   **Note: The testing scope of this panel does not include the  following reported medications:   Albuterol (Ventolin HFA)  Celecoxib (Celebrex)  Cyanocobalamin  Epinephrine (EpiPen)  Eye Drop  Fluticasone (Flonase)  Folic Acid  Hydrochlorothiazide  Levothyroxine  Metronidazole  Minocycline  Simvastatin (Zocor)  Triamcinolone (Kenalog)  Triamterene ==================================================================== For clinical consultation, please call [phone]. ====================================================================       ROS  Constitutional: Denies any fever or chills Gastrointestinal: No reported hemesis, hematochezia, vomiting, or acute GI distress Musculoskeletal:  Low back pain with radiation into right buttock and right leg has improved after right L3, L4, L5 RFA although has been having increases episodes of breakthrough pain Neurological: No reported episodes of acute onset apraxia, aphasia, dysarthria, agnosia, amnesia, paralysis, loss of coordination, or loss of consciousness  Medication Review  EPINEPHrine, HYDROcodone-acetaminophen, Propylene Glycol, acetaminophen, albuterol, aspirin EC, celecoxib, cyanocobalamin, cycloSPORINE, diclofenac Sodium, ergocalciferol, escitalopram, fluticasone, folic acid, gabapentin, ketoconazole, levothyroxine, lidocaine, methocarbamol, metroNIDAZOLE, minocycline, simvastatin, traZODone, triamcinolone cream, and triamterene-hydrochlorothiazide  History Review  Allergy: [name] is allergic to cephalexin, cephalosporins, prochlorperazine, shrimp extract allergy skin test, bee venom, latex, other, and silicone. Drug: [name]  has no history on file for drug use. Alcohol:  has no history on file for alcohol use. Tobacco:  reports that she quit smoking about 20 years ago. Her smoking use included cigarettes. She has never been exposed to tobacco smoke. She has never used smokeless tobacco. Social: [name]  reports that she quit smoking about 20 years ago. Her smoking use included cigarettes. She has never been exposed to tobacco smoke. She has never used smokeless tobacco. Medical:  has no past medical history on file. Surgical: [name]  has no past surgical history on file. Family: family history is not on file.   Physical Exam  General appearance: Well nourished, well developed, and well hydrated. In no apparent acute distress Mental status: Alert, oriented x 3 (person, place, & time)       Respiratory: No evidence of acute respiratory  distress Eyes: PERLA Vitals: BP 139/76 (BP Location: Left Arm, Patient Position: Sitting, Cuff Size: Large)   Pulse (!) 52   Temp (!) 96.8 F (36 C) (Temporal)   Resp 16   Ht 5\' 7"  (1.702 m)   Wt 212 lb (96.2 kg)   SpO2 100%   BMI 33.20 kg/m  BMI: Estimated body mass index is 33.2 kg/m as calculated from the following:   Height as of this encounter: 5\' 7"  (1.702 m).   Weight as of this encounter: 212 lb (96.2 kg). Ideal: Ideal body weight: 61.6 kg (135 lb 12.9 oz) Adjusted ideal body weight: 75.4 kg (166 lb 4.5  oz)  Lumbar Spine Area Exam  Skin & Axial Inspection: No masses, redness, or swelling Alignment: Symmetrical Functional ROM: Improved after treatment on the right       Stability: No instability detected Muscle Tone/Strength: Functionally intact. No obvious neuro-muscular anomalies detected. Sensory (Neurological): Improved after treatment on the right  Gait & Posture Assessment  Ambulation: Unassisted Gait: Relatively normal for age and body habitus Posture: WNL  Lower Extremity Exam    Side: Right lower extremity  Side: Left lower extremity  Stability: No instability observed          Stability: No instability observed          Skin & Extremity Inspection: Skin color, temperature, and hair growth are WNL. No peripheral edema or cyanosis. No masses, redness, swelling, asymmetry, or associated skin lesions. No contractures.  Skin & Extremity Inspection: Skin color, temperature, and hair growth are WNL. No peripheral edema or cyanosis. No masses, redness, swelling, asymmetry, or associated skin lesions. No contractures.  Functional ROM: Unrestricted ROM                  Functional ROM: Unrestricted ROM                  Muscle Tone/Strength: Functionally intact. No obvious neuro-muscular anomalies detected.  Muscle Tone/Strength: Functionally intact. No obvious neuro-muscular anomalies detected.  Sensory (Neurological): Unimpaired        Sensory (Neurological): Unimpaired         DTR: Patellar: deferred today Achilles: deferred today Plantar: deferred today  DTR: Patellar: deferred today Achilles: deferred today Plantar: deferred today  Palpation: No palpable anomalies  Palpation: No palpable anomalies    Assessment   Diagnosis Status  1. Lumbar facet arthropathy   2. Lumbar facet joint syndrome   3. Chronic pain of right knee   4. Primary osteoarthritis of right knee   5. Lumbar degenerative disc disease   6. Chronic pain syndrome    Controlled Controlled Controlled     Plan of Care   [name] has a current medication list which includes the following long-term medication(s): escitalopram, fluticasone, gabapentin, levothyroxine, simvastatin, trazodone, triamterene-hydrochlorothiazide, and ventolin hfa.  Patient is experiencing satisfactory analgesic and functional benefit after her right L3, L4, L5 RFA, we will continue to monitor her symptoms and repeat as needed anytime after 6 months.  Otherwise we will refill her hydrocodone as below.  Slight adjustment in dose as below.   Pharmacotherapy (Medications Ordered): Meds ordered this encounter  Medications   HYDROcodone-acetaminophen (NORCO/VICODIN) 5-325 MG tablet    Sig: Take 1 tablet by mouth every 12 (twelve) hours as needed for moderate pain.    Dispense:  45 tablet    Refill:  0   HYDROcodone-acetaminophen (NORCO/VICODIN) 5-325 MG tablet    Sig: Take 1 tablet by mouth every 12 (twelve) hours as needed for moderate pain.    Dispense:  45 tablet    Refill:  0   HYDROcodone-acetaminophen (NORCO/VICODIN) 5-325 MG tablet    Sig: Take 1 tablet by mouth every 12 (twelve) hours as needed for moderate pain.    Dispense:  45 tablet    Refill:  0   Orders:  Orders Placed This Encounter  Procedures   ToxASSURE Select 13 (MW), Urine    Volume: 30 ml(s). Minimum 3 ml of urine is needed. Document temperature of fresh sample. Indications: Long term (current) use of opiate  analgesic [phone])    Order Specific Question:  Release to patient    Answer:   Immediate   Follow-up plan:   Return in about 3 months (around 12/18/2022) for Medication Management, in person.     R L3,4,5 RFA 02/26/22; 08/29/22 repeat as needed   Recent Visits Date Type Provider Dept  08/29/22 Procedure visit Gillis Santa, MD [facility]  08/13/22 Office Visit Gillis Santa, MD [facility]  07/18/22 Procedure visit Gillis Santa, MD [facility]  06/26/22 Office Visit Gillis Santa, MD [facility]  Showing recent visits within past 90 days and meeting all other requirements Today's Visits Date Type Provider Dept  09/18/22 Office Visit Gillis Santa, MD [facility]  Showing today's visits and meeting all other requirements Future Appointments No visits were found meeting these conditions. Showing future appointments within next 90 days and meeting all other requirements  I discussed the assessment and treatment plan with the patient. The patient was provided an opportunity to ask questions and all were answered. The patient agreed with the plan and demonstrated an understanding of the instructions.  Patient advised to call back or seek an in-person evaluation if the symptoms or condition worsens.  Duration of encounter:61minutes.  Note by: Gillis Santa, MD Date: 09/18/2022; Time: 10:31 AM

## 2022-09-22 LAB — TOXASSURE SELECT 13 (MW), URINE

## 2022-11-08 ENCOUNTER — Other Ambulatory Visit: Payer: Self-pay

## 2022-11-08 ENCOUNTER — Ambulatory Visit
Payer: Medicare Other | Attending: Student in an Organized Health Care Education/Training Program | Admitting: Student in an Organized Health Care Education/Training Program

## 2022-11-08 ENCOUNTER — Encounter: Payer: Self-pay | Admitting: Student in an Organized Health Care Education/Training Program

## 2022-11-08 VITALS — BP 159/87 | HR 56 | Temp 98.5°F | Resp 18 | Ht 67.0 in | Wt 205.0 lb

## 2022-11-08 DIAGNOSIS — G894 Chronic pain syndrome: Secondary | ICD-10-CM | POA: Insufficient documentation

## 2022-11-08 DIAGNOSIS — M47816 Spondylosis without myelopathy or radiculopathy, lumbar region: Secondary | ICD-10-CM | POA: Diagnosis present

## 2022-11-08 MED ORDER — HYDROCODONE-ACETAMINOPHEN 5-325 MG PO TABS: 1.0000 | ORAL_TABLET | Freq: Two times a day (BID) | ORAL | 0 refills | Status: DC | PRN

## 2022-11-08 MED ORDER — LIDOCAINE 5 % EX PTCH: 1.0000 | MEDICATED_PATCH | Freq: Two times a day (BID) | CUTANEOUS | 11 refills | Status: DC

## 2022-11-08 MED ORDER — HYDROCODONE-ACETAMINOPHEN 5-325 MG PO TABS: 1.0000 | ORAL_TABLET | Freq: Two times a day (BID) | ORAL | 0 refills | Status: AC | PRN

## 2022-11-08 NOTE — Progress Notes (Signed)
PROVIDER NOTE: Information contained herein reflects review and annotations entered in association with encounter. Interpretation of such information and data should be left to medically-trained personnel. Information provided to patient can be located elsewhere in the medical record under "Patient Instructions". Document created using STT-dictation technology, any transcriptional errors that may result from process are unintentional.    Patient: Sandy Byrd  Service Category: E/M  Provider: Edward Jolly, MD  DOB: Feb 29, 1955  DOS: 11/08/2022  Specialty: Interventional Pain Management  MRN: [id number]  Setting: Ambulatory outpatient  PCP: Enid Baas, MD  Type: Established Patient    Referring Provider: Enid Baas, MD  Location: Office  Delivery: Face-to-face     HPI  [name], a 67 y.o. year old female, is here today because of her Lumbar facet arthropathy [M47.816]. [name]'s primary complain today is Back Pain (lower), Shoulder Pain (bilateral), and Hip Pain (Right greater than left) Last encounter: My last encounter with her was on 09/18/22 Pertinent problems: [name] has Lumbar facet arthropathy; Lumbar degenerative disc disease; Chronic pain syndrome; and H/O rotator cuff surgery (bilateral) on their pertinent problem list. Pain Assessment: Severity of Chronic pain is reported as a 27 /10. Location: Back Lower/hips     Also complains of shoulder pain. Onset: More than a month ago. Quality: Radiating, Sharp, Constant. Timing: Constant. Modifying factor(s): topicals, medications, heat , ice, massage. Vitals:  height is 5\' 7"  (1.702 m) and weight is 205 lb (93 kg). Her temporal temperature is 98.5 F (36.9 C). Her blood pressure is 159/87 (abnormal) and her pulse is 56 (abnormal). Her respiration is 18 and oxygen saturation is 97%.   Reason for encounter: medication management.   Patient states that she has been having more difficulty managing her low  back pain.  She states that her lumbar radiofrequency ablation well effective has not helped in reducing her medication intake.  She is requesting a prescription for lidocaine 5% patches that she can apply to her lower back as of 4% are not helpful.  She is also requesting an increase in her hydrocodone.  Although she takes a low-dose, she feels that having 5 to 10 mg daily could be helpful.  This is reasonable.  We will send a new prescription to be filled later this month upon her due date reflecting a quantity of 60 so that she can take 5 to 10 mg daily as needed.  She also has trigger points in bilateral deltoids.  She has a history of bilateral shoulder surgery.  We discussed bilateral trigger point injections in those regions.   Pharmacotherapy Assessment  Analgesic:Norco 5 mg BID prn #45/month  Monitoring: [place] PMP: PDMP reviewed during this encounter.       Pharmacotherapy: No side-effects or adverse reactions reported. Compliance: No problems identified. Effectiveness: Clinically acceptable.  , RN  11/08/2022  8:08 AM  Signed Nursing Pain Medication Assessment:  Safety precautions to be maintained throughout the outpatient stay will include: orient to surroundings, keep bed in low position, maintain call bell within reach at all times, provide assistance with transfer out of bed and ambulation.  Medication Inspection Compliance: Pill count conducted under aseptic conditions, in front of the patient. Neither the pills nor the bottle was removed from the patient's sight at any time. Once count was completed pills were immediately returned to the patient in their original bottle.  Medication: Hydrocodone/APAP Pill/Patch Count:  10 of 45 pills remain Pill/Patch Appearance: Markings consistent with prescribed medication Bottle Appearance:  Standard pharmacy container. Clearly labeled. Filled Date: 21 / 19 / 2023 Last Medication intake:  Yesterday     UDS:  Summary  Date  Value Ref Range Status  09/18/2022 Note  Final    Comment:    ==================================================================== [employer] (MW) ==================================================================== Test                             Result       Flag       Units  Drug Present and Declared for Prescription Verification   Norhydrocodone                 119          EXPECTED   ng/mg creat    Norhydrocodone is an expected metabolite of hydrocodone.  Drug Present not Declared for Prescription Verification   Oxazepam                       27           UNEXPECTED ng/mg creat    Oxazepam may be administered as a scheduled prescription medication;    it is also an expected metabolite of other benzodiazepine drugs,    including diazepam, chlordiazepoxide, prazepam, clorazepate,    halazepam, and temazepam.  Drug Absent but Declared for Prescription Verification   Hydrocodone                    Not Detected UNEXPECTED ng/mg creat    Hydrocodone is almost always present in patients taking this drug    consistently. Absence of hydrocodone could be due to lapse of time    since the last dose or unusual pharmacokinetics (rapid metabolism).  ==================================================================== Test                      Result    Flag   Units      Ref Range   Creatinine              73               mg/dL      >=93 ==================================================================== Declared Medications:  The flagging and interpretation on this report are based on the  following declared medications.  Unexpected results may arise from  inaccuracies in the declared medications.   **Note: The testing scope of this panel includes these medications:   Hydrocodone (Norco)   **Note: The testing scope of this panel does not include the  following reported medications:   Acetaminophen (Tylenol)  Acetaminophen (Norco)  Albuterol (Ventolin HFA)  Aspirin   Celecoxib (Celebrex)  Cyanocobalamin  Diclofenac (Voltaren)  Epinephrine (EpiPen)  Escitalopram (Lexapro)  Eye Drop  Fluticasone (Flonase)  Folic Acid  Gabapentin (Neurontin)  Hydrochlorothiazide (Dyazide)  Ketoconazole (Nizoral)  Levothyroxine (Synthroid)  Methocarbamol (Robaxin)  Metronidazole (MetroGel)  Minocycline (Minocin)  Polyethylene Glycol  Simvastatin (Zocor)  Topical Lidocaine (Lidoderm)  Trazodone (Desyrel)  Triamcinolone (Kenalog)  Triamterene (Dyazide)  Vitamin D2 ==================================================================== For clinical consultation, please call [phone]. ====================================================================      ROS  Constitutional: Denies any fever or chills Gastrointestinal: No reported hemesis, hematochezia, vomiting, or acute GI distress Musculoskeletal:  Bilateral shoulder pain, low back pain Neurological: No reported episodes of acute onset apraxia, aphasia, dysarthria, agnosia, amnesia, paralysis, loss of coordination, or loss of consciousness  Medication Review  EPINEPHrine, HYDROcodone-acetaminophen, Propylene Glycol, acetaminophen, albuterol, aspirin EC, celecoxib, ciprofloxacin,  cyanocobalamin, cycloSPORINE, diclofenac Sodium, ergocalciferol, escitalopram, fluticasone, folic acid, gabapentin, ketoconazole, levothyroxine, lidocaine, methocarbamol, metroNIDAZOLE, minocycline, predniSONE, simvastatin, traZODone, triamcinolone cream, and triamterene-hydrochlorothiazide  History Review  Allergy: [name] is allergic to cephalexin, cephalosporins, prochlorperazine, shrimp extract allergy skin test, bee venom, latex, other, and silicone. Drug: [name]  has no history on file for drug use. Alcohol:  has no history on file for alcohol use. Tobacco:  reports that she quit smoking about 20 years ago. Her smoking use included cigarettes. She has never been exposed to tobacco smoke. She has never used  smokeless tobacco. Social: [name]  reports that she quit smoking about 20 years ago. Her smoking use included cigarettes. She has never been exposed to tobacco smoke. She has never used smokeless tobacco. Medical:  has no past medical history on file. Surgical: [name]  has no past surgical history on file. Family: family history is not on file.   Physical Exam  General appearance: Well nourished, well developed, and well hydrated. In no apparent acute distress Mental status: Alert, oriented x 3 (person, place, & time)       Respiratory: No evidence of acute respiratory distress Eyes: PERLA Vitals: BP (!) 159/87   Pulse (!) 56   Temp 98.5 F (36.9 C) (Temporal)   Resp 18   Ht 5\' 7"  (1.702 m)   Wt 205 lb (93 kg)   SpO2 97%   BMI 32.11 kg/m  BMI: Estimated body mass index is 32.11 kg/m as calculated from the following:   Height as of this encounter: 5\' 7"  (1.702 m).   Weight as of this encounter: 205 lb (93 kg). Ideal: Ideal body weight: 61.6 kg (135 lb 12.9 oz) Adjusted ideal body weight: 74.2 kg (163 lb 7.7 oz)  Bilateral deltoid tenderness, history of prior arthroscopic shoulder surgery.  Lumbar Spine Area Exam  Skin & Axial Inspection: No masses, redness, or swelling Alignment: Symmetrical Functional ROM: Decreased ROM on the right greater than left       Stability: No instability detected Muscle Tone/Strength: Functionally intact. No obvious neuro-muscular anomalies detected. Sensory (Neurological): Musculoskeletal pain pattern after treatment on the right  Gait & Posture Assessment  Ambulation: Unassisted Gait: Relatively normal for age and body habitus Posture: WNL  Lower Extremity Exam    Side: Right lower extremity  Side: Left lower extremity  Stability: No instability observed          Stability: No instability observed          Skin & Extremity Inspection: Skin color, temperature, and hair growth are WNL. No peripheral edema or cyanosis. No  masses, redness, swelling, asymmetry, or associated skin lesions. No contractures.  Skin & Extremity Inspection: Skin color, temperature, and hair growth are WNL. No peripheral edema or cyanosis. No masses, redness, swelling, asymmetry, or associated skin lesions. No contractures.  Functional ROM: Unrestricted ROM                  Functional ROM: Unrestricted ROM                  Muscle Tone/Strength: Functionally intact. No obvious neuro-muscular anomalies detected.  Muscle Tone/Strength: Functionally intact. No obvious neuro-muscular anomalies detected.  Sensory (Neurological): Unimpaired        Sensory (Neurological): Unimpaired        DTR: Patellar: deferred today Achilles: deferred today Plantar: deferred today  DTR: Patellar: deferred today Achilles: deferred today Plantar: deferred today  Palpation: No palpable anomalies  Palpation: No  palpable anomalies    Assessment   Diagnosis Status  1. Lumbar facet arthropathy   2. Lumbar facet joint syndrome   3. Chronic pain syndrome    Controlled Controlled Controlled     Plan of Care   Sandy Byrd has a current medication list which includes the following long-term medication(s): escitalopram, fluticasone, gabapentin, levothyroxine, simvastatin, trazodone, triamterene-hydrochlorothiazide, and ventolin hfa.  1. Lumbar facet arthropathy - HYDROcodone-acetaminophen (NORCO/VICODIN) 5-325 MG tablet; Take 1 tablet by mouth every 12 (twelve) hours as needed for moderate pain.  Dispense: 60 tablet; Refill: 0 - HYDROcodone-acetaminophen (NORCO/VICODIN) 5-325 MG tablet; Take 1 tablet by mouth every 12 (twelve) hours as needed for moderate pain.  Dispense: 60 tablet; Refill: 0 - TRIGGER POINT INJECTION; Future - HYDROcodone-acetaminophen (NORCO/VICODIN) 5-325 MG tablet; Take 1 tablet by mouth every 12 (twelve) hours as needed for moderate pain.  Dispense: 60 tablet; Refill: 0  2. Lumbar facet joint syndrome -  HYDROcodone-acetaminophen (NORCO/VICODIN) 5-325 MG tablet; Take 1 tablet by mouth every 12 (twelve) hours as needed for moderate pain.  Dispense: 60 tablet; Refill: 0 - HYDROcodone-acetaminophen (NORCO/VICODIN) 5-325 MG tablet; Take 1 tablet by mouth every 12 (twelve) hours as needed for moderate pain.  Dispense: 60 tablet; Refill: 0 - TRIGGER POINT INJECTION; Future - HYDROcodone-acetaminophen (NORCO/VICODIN) 5-325 MG tablet; Take 1 tablet by mouth every 12 (twelve) hours as needed for moderate pain.  Dispense: 60 tablet; Refill: 0  3. Chronic pain syndrome - HYDROcodone-acetaminophen (NORCO/VICODIN) 5-325 MG tablet; Take 1 tablet by mouth every 12 (twelve) hours as needed for moderate pain.  Dispense: 60 tablet; Refill: 0 - HYDROcodone-acetaminophen (NORCO/VICODIN) 5-325 MG tablet; Take 1 tablet by mouth every 12 (twelve) hours as needed for moderate pain.  Dispense: 60 tablet; Refill: 0 - TRIGGER POINT INJECTION; Future - HYDROcodone-acetaminophen (NORCO/VICODIN) 5-325 MG tablet; Take 1 tablet by mouth every 12 (twelve) hours as needed for moderate pain.  Dispense: 60 tablet; Refill: 0    Pharmacotherapy (Medications Ordered): Meds ordered this encounter  Medications   lidocaine (LIDODERM) 5 %    Sig: Place 1 patch onto the skin every 12 (twelve) hours. Remove & Discard patch within 12 hours or as directed by MD    Dispense:  60 patch    Refill:  11   HYDROcodone-acetaminophen (NORCO/VICODIN) 5-325 MG tablet    Sig: Take 1 tablet by mouth every 12 (twelve) hours as needed for moderate pain.    Dispense:  60 tablet    Refill:  0   HYDROcodone-acetaminophen (NORCO/VICODIN) 5-325 MG tablet    Sig: Take 1 tablet by mouth every 12 (twelve) hours as needed for moderate pain.    Dispense:  60 tablet    Refill:  0   HYDROcodone-acetaminophen (NORCO/VICODIN) 5-325 MG tablet    Sig: Take 1 tablet by mouth every 12 (twelve) hours as needed for moderate pain.    Dispense:  60 tablet    Refill:   0   Orders:  Orders Placed This Encounter  Procedures   TRIGGER POINT INJECTION    Standing Status:   Future    Standing Expiration Date:   02/08/2023    Scheduling Instructions:     Bilateral deltoid TPI    Order Specific Question:   Where will this procedure be performed?    Answer:   [facility]   Follow-up plan:   Return in about 2 weeks (around 11/22/2022) for Deltoid TPI.     R L3,4,5 RFA 02/26/22; 08/29/22 repeat as  needed   Recent Visits Date Type Provider Dept  09/18/22 Office Visit Edward Jolly, MD [facility]  08/29/22 Procedure visit Edward Jolly, MD [facility]  08/13/22 Office Visit Edward Jolly, MD [facility]  Showing recent visits within past 90 days and meeting all other requirements Today's Visits Date Type Provider Dept  11/08/22 Office Visit Edward Jolly, MD [facility]  Showing today's visits and meeting all other requirements Future Appointments Date Type Provider Dept  01/31/23 Appointment Edward Jolly, MD [facility]  Showing future appointments within next 90 days and meeting all other requirements  I discussed the assessment and treatment plan with the patient. The patient was provided an opportunity to ask questions and all were answered. The patient agreed with the plan and demonstrated an understanding of the instructions.  Patient advised to call back or seek an in-person evaluation if the symptoms or condition worsens.  Duration of encounter:32minutes.  Note by: Edward Jolly, MD Date: 11/08/2022; Time: 9:43 AM

## 2022-11-08 NOTE — Progress Notes (Signed)
Nursing Pain Medication Assessment:  Safety precautions to be maintained throughout the outpatient stay will include: orient to surroundings, keep bed in low position, maintain call bell within reach at all times, provide assistance with transfer out of bed and ambulation.  Medication Inspection Compliance: Pill count conducted under aseptic conditions, in front of the patient. Neither the pills nor the bottle was removed from the patient's sight at any time. Once count was completed pills were immediately returned to the patient in their original bottle.  Medication: Hydrocodone/APAP Pill/Patch Count:  10 of 45 pills remain Pill/Patch Appearance: Markings consistent with prescribed medication Bottle Appearance: Standard pharmacy container. Clearly labeled. Filled Date: 4 / 19 / 2023 Last Medication intake:  Yesterday

## 2022-11-26 ENCOUNTER — Ambulatory Visit
Payer: Medicare Other | Attending: Student in an Organized Health Care Education/Training Program | Admitting: Student in an Organized Health Care Education/Training Program

## 2022-11-26 ENCOUNTER — Encounter: Payer: Self-pay | Admitting: Student in an Organized Health Care Education/Training Program

## 2022-11-26 VITALS — BP 120/54 | HR 74 | Temp 97.3°F | Ht 67.0 in | Wt 202.0 lb

## 2022-11-26 DIAGNOSIS — M7918 Myalgia, other site: Secondary | ICD-10-CM | POA: Insufficient documentation

## 2022-11-26 DIAGNOSIS — G894 Chronic pain syndrome: Secondary | ICD-10-CM | POA: Insufficient documentation

## 2022-11-26 DIAGNOSIS — Z9889 Other specified postprocedural states: Secondary | ICD-10-CM | POA: Diagnosis present

## 2022-11-26 MED ORDER — ROPIVACAINE HCL 2 MG/ML IJ SOLN
INTRAMUSCULAR | Status: AC
  Filled 2022-11-26: qty 20

## 2022-11-26 MED ORDER — ROPIVACAINE HCL 2 MG/ML IJ SOLN
9.0000 mL | Freq: Once | INTRAMUSCULAR | Status: AC
  Administered 2022-11-26: 9 mL via PERINEURAL

## 2022-11-26 MED ORDER — LIDOCAINE HCL 2 % IJ SOLN
INTRAMUSCULAR | Status: AC
  Filled 2022-11-26: qty 20

## 2022-11-26 NOTE — Progress Notes (Signed)
Safety precautions to be maintained throughout the outpatient stay will include: orient to surroundings, keep bed in low position, maintain call bell within reach at all times, provide assistance with transfer out of bed and ambulation.  

## 2022-11-26 NOTE — Progress Notes (Signed)
PROVIDER NOTE: Interpretation of information contained herein should be left to medically-trained personnel. Specific patient instructions are provided elsewhere under "Patient Instructions" section of medical record. This document was created in part using STT-dictation technology, any transcriptional errors that may result from this process are unintentional.  Patient: Sandy Byrd Type: Established DOB: 08/30/1955 MRN: [id number] PCP: Enid Baas, MD  Service: Procedure DOS: 11/26/2022 Setting: Ambulatory Location: Ambulatory outpatient facility Delivery: Face-to-face Provider: Edward Jolly, MD Specialty: Interventional Pain Management Specialty designation: 09 Location: Outpatient facility Ref. Prov.: Enid Baas, MD    Primary Reason for Visit: Interventional Pain Management Treatment. CC: Shoulder Pain (bilateral)    Procedure:          Anesthesia, Analgesia, Anxiolysis:  Type: Cervical and shoulder Trigger Point Injection (3+ muscle groups) deltoid, trapezius, cervical paraspinals          CPT: 20553   Type: Local Anesthesia Local Anesthetic: Lidocaine 1-2%    1. H/O rotator cuff surgery (bilateral)   2. Chronic pain syndrome    NAS-11 Pain score:   Pre-procedure: 5 /10   Post-procedure: 5 /10     Pre-op H&P Assessment:  Sandy Byrd is a 67 y.o. (year old), female patient, seen today for interventional treatment. She  has no past surgical history on file. Sandy Byrd has a current medication list which includes the following prescription(s): acetaminophen, aspirin ec, celecoxib, ciprofloxacin, cyanocobalamin, cyclosporine, diclofenac sodium, epinephrine, ergocalciferol, escitalopram, fluticasone, folic acid, gabapentin, hydrocodone-acetaminophen, [START ON 12/16/2022] hydrocodone-acetaminophen, [START ON 01/15/2023] hydrocodone-acetaminophen, ketoconazole, levothyroxine, lidocaine, lidocaine, methocarbamol, metronidazole, minocycline, prednisone,  propylene glycol, trazodone, triamcinolone cream, triamterene-hydrochlorothiazide, ventolin hfa, and simvastatin. Her primarily concern today is the Shoulder Pain (bilateral)  Initial Vital Signs:  Pulse/HCG Rate: 74  Temp: (!) 97.3 F (36.3 C) Resp:   BP: (!) 120/54 SpO2: 98 %  BMI: Estimated body mass index is 31.64 kg/m as calculated from the following:   Height as of this encounter: 5\' 7"  (1.702 m).   Weight as of this encounter: 202 lb (91.6 kg).  Risk Assessment: Allergies: Reviewed. She is allergic to cephalexin, cephalosporins, prochlorperazine, shrimp extract allergy skin test, bee venom, latex, other, and silicone.  Allergy Precautions: None required Coagulopathies: Reviewed. None identified.  Blood-thinner therapy: None at this time Active Infection(s): Reviewed. None identified. Sandy Byrd is afebrile  Site Confirmation: Sandy Byrd was asked to confirm the procedure and laterality before marking the site Procedure checklist: Completed Consent: Before the procedure and under the influence of no sedative(s), amnesic(s), or anxiolytics, the patient was informed of the treatment options, risks and possible complications. To fulfill our ethical and legal obligations, as recommended by the American Medical Association's Code of Ethics, I have informed the patient of my clinical impression; the nature and purpose of the treatment or procedure; the risks, benefits, and possible complications of the intervention; the alternatives, including doing nothing; the risk(s) and benefit(s) of the alternative treatment(s) or procedure(s); and the risk(s) and benefit(s) of doing nothing. The patient was provided information about the general risks and possible complications associated with the procedure. These may include, but are not limited to: failure to achieve desired goals, infection, bleeding, organ or nerve damage, allergic reactions, paralysis, and death. In addition, the patient  was informed of those risks and complications associated to the procedure, such as failure to decrease pain; infection; bleeding; organ or nerve damage with subsequent damage to sensory, motor, and/or autonomic systems, resulting in permanent pain, numbness, and/or weakness of one or several areas of the body;  allergic reactions; (i.e.: anaphylactic reaction); and/or death. Furthermore, the patient was informed of those risks and complications associated with the medications. These include, but are not limited to: allergic reactions (i.e.: anaphylactic or anaphylactoid reaction(s)); adrenal axis suppression; blood sugar elevation that in diabetics may result in ketoacidosis or comma; water retention that in patients with history of congestive Byrd failure may result in shortness of breath, pulmonary edema, and decompensation with resultant Byrd failure; weight gain; swelling or edema; medication-induced neural toxicity; particulate matter embolism and blood vessel occlusion with resultant organ, and/or nervous system infarction; and/or aseptic necrosis of one or more joints. Finally, the patient was informed that Medicine is not an exact science; therefore, there is also the possibility of unforeseen or unpredictable risks and/or possible complications that may result in a catastrophic outcome. The patient indicated having understood very clearly. We have given the patient no guarantees and we have made no promises. Enough time was given to the patient to ask questions, all of which were answered to the patient's satisfaction. Sandy Byrd has indicated that she wanted to continue with the procedure. Attestation: I, the ordering provider, attest that I have discussed with the patient the benefits, risks, side-effects, alternatives, likelihood of achieving goals, and potential problems during recovery for the procedure that I have provided informed consent. Date  Time: 11/26/2022  8:06 AM  Pre-Procedure  Preparation:  Monitoring: As per clinic protocol. Respiration, ETCO2, SpO2, BP, Byrd rate and rhythm monitor placed and checked for adequate function Safety Precautions: Patient was assessed for positional comfort and pressure points before starting the procedure. Time-out: I initiated and conducted the "Time-out" before starting the procedure, as per protocol. The patient was asked to participate by confirming the accuracy of the "Time Out" information. Verification of the correct person, site, and procedure were performed and confirmed by me, the nursing staff, and the patient. "Time-out" conducted as per Joint Commission's Universal Protocol (UP.01.01.01). Time: 0849  Description of Procedure:          Area Prepped: Entire cervical and shoulder region DuraPrep (Iodine Povacrylex [0.7% available iodine] and Isopropyl Alcohol, 74% w/w) Safety Precautions: Aspiration looking for blood return was conducted prior to all injections. At no point did we inject any substances, as a needle was being advanced. No attempts were made at seeking any paresthesias. Safe injection practices and needle disposal techniques used. Medications properly checked for expiration dates. SDV (single dose vial) medications used. Description of the Procedure: Protocol guidelines were followed. The patient was placed in position over the fluoroscopy table. The target area was identified and the area prepped in the usual manner. Skin & deeper tissues infiltrated with local anesthetic. Appropriate amount of time allowed to pass for local anesthetics to take effect. The procedure needles were then advanced to the target area. Proper needle placement secured. Negative aspiration confirmed. Solution injected in intermittent fashion, asking for systemic symptoms every 0.5cc of injectate. The needles were then removed and the area cleansed, making sure to leave some of the prepping solution back to take advantage of its long term  bactericidal properties.  Vitals:   11/26/22 0810  BP: (!) 120/54  Pulse: 74  Temp: (!) 97.3 F (36.3 C)  TempSrc: Temporal  SpO2: 98%  Weight: 202 lb (91.6 kg)  Height: 5\' 7"  (1.702 m)    Start Time: 0849 hrs. End Time: 0853 hrs. Materials:  Approximately 8 trigger points injected with 0.5 to 1 cc of 0.2% ropivacaine with dry needling done   Post-operative  Assessment:  Post-procedure Vital Signs:  Pulse/HCG Rate: 74  Temp: (!) 97.3 F (36.3 C) Resp:   BP: (!) 120/54 SpO2: 98 %  EBL: None  Complications: No immediate post-treatment complications observed by team, or reported by patient.  Note: The patient tolerated the entire procedure well. A repeat set of vitals were taken after the procedure and the patient was kept under observation following institutional policy, for this type of procedure. Post-procedural neurological assessment was performed, showing return to baseline, prior to discharge. The patient was provided with post-procedure discharge instructions, including a section on how to identify potential problems. Should any problems arise concerning this procedure, the patient was given instructions to immediately contact us, at any time, without hesitation. In any case, we plan to contact the patient by telephone for a follow-up status report regarding this interventional procedure.  Comments:  No additional relevant information.  Plan of Care  Orders:  No orders of the defined types were placed in this encounter.  Chronic Opioid Analgesic:  Norco 5 mg daily prn #30 usually lasts 10-12 weeks   Medications ordered for procedure: Meds ordered this encounter  Medications   ropivacaine (PF) 2 mg/mL (0.2%) (NAROPIN) injection 9 mL   Medications administered: We administered ropivacaine (PF) 2 mg/mL (0.2%).  See the medical record for exact dosing, route, and time of administration.  Follow-up plan:   Return in about 3 months (around 02/26/2023) for med refill  needs appt before or around 02/09/2023.       R L3,4,5 RFA 02/26/22; 08/29/22 repeat as needed    Recent Visits Date Type Provider Dept  11/08/22 Office Visit Gillis Santa, MD [facility]  09/18/22 Office Visit Gillis Santa, MD [facility]  08/29/22 Procedure visit Gillis Santa, MD [facility]  Showing recent visits within past 90 days and meeting all other requirements Today's Visits Date Type Provider Dept  11/26/22 Procedure visit Gillis Santa, MD [facility]  Showing today's visits and meeting all other requirements Future Appointments Date Type Provider Dept  02/05/23 Appointment Gillis Santa, MD [facility]  Showing future appointments within next 90 days and meeting all other requirements  Disposition: Discharge home  Discharge (Date  Time): 11/26/2022; 0900 hrs.   Primary Care Physician: Gladstone Lighter, MD Location: [facility] Note by: Gillis Santa, MD Date: 11/26/2022; Time: 9:31 AM  Disclaimer:  Medicine is not an exact science. The only guarantee in medicine is that nothing is guaranteed. It is important to note that the decision to proceed with this intervention was based on the information collected from the patient. The Data and conclusions were drawn from the patient's questionnaire, the interview, and the physical examination. Because the information was provided in large part by the patient, it cannot be guaranteed that it has not been purposely or unconsciously manipulated. Every effort has been made to obtain as much relevant data as possible for this evaluation. It is important to note that the conclusions that lead to this procedure are derived in large part from the available data. Always take into account that the treatment will also be dependent on availability of resources and existing treatment guidelines, considered by other Pain Management Practitioners as being common  knowledge and practice, at the time of the intervention. For Medico-Legal purposes, it is also important to point out that variation in procedural techniques and pharmacological choices are the acceptable norm. The indications, contraindications, technique, and results of the above procedure should only  be interpreted and judged by a Board-Certified Interventional Pain Specialist with extensive familiarity and expertise in the same exact procedure and technique.

## 2022-11-26 NOTE — Patient Instructions (Signed)

## 2022-11-27 ENCOUNTER — Telehealth: Payer: Self-pay

## 2022-11-27 NOTE — Telephone Encounter (Signed)
PA was redone and sent to [facility] for the Lidocaine 5% patches.

## 2022-11-27 NOTE — Telephone Encounter (Signed)
Post procedure phone call. Patient states she is doing good.  

## 2022-11-27 NOTE — Telephone Encounter (Signed)
Called and states that the PA that was done for the LIdocaine patch was done for the 4% and not the 5%.  Would like someone to check on that please.

## 2022-12-04 ENCOUNTER — Other Ambulatory Visit: Payer: Self-pay | Admitting: Internal Medicine

## 2022-12-04 DIAGNOSIS — R7989 Other specified abnormal findings of blood chemistry: Secondary | ICD-10-CM

## 2022-12-11 ENCOUNTER — Other Ambulatory Visit: Payer: Medicare Other

## 2022-12-13 ENCOUNTER — Ambulatory Visit
Admission: RE | Admit: 2022-12-13 | Discharge: 2022-12-13 | Disposition: A | Discharge status: Home / Self Care | Payer: Medicare Other | Source: Ambulatory Visit | Attending: Internal Medicine | Admitting: Internal Medicine

## 2022-12-13 DIAGNOSIS — R7989 Other specified abnormal findings of blood chemistry: Secondary | ICD-10-CM

## 2022-12-18 ENCOUNTER — Encounter: Payer: Medicare Other | Admitting: Student in an Organized Health Care Education/Training Program

## 2022-12-21 ENCOUNTER — Telehealth: Payer: Self-pay | Admitting: Student in an Organized Health Care Education/Training Program

## 2022-12-21 NOTE — Telephone Encounter (Signed)
Patient is still having pain since TPI, seems to be worse. Please call  [phone]

## 2022-12-21 NOTE — Telephone Encounter (Signed)
Please schedule a virtual visit with Dr Cherylann Ratel to discuss.

## 2022-12-26 ENCOUNTER — Telehealth: Payer: Self-pay

## 2022-12-26 NOTE — Telephone Encounter (Signed)
LM for patient to call office for pre virtual appointment questions.  

## 2022-12-27 ENCOUNTER — Ambulatory Visit
Payer: Medicare Other | Attending: Student in an Organized Health Care Education/Training Program | Admitting: Student in an Organized Health Care Education/Training Program

## 2022-12-27 ENCOUNTER — Telehealth: Payer: Self-pay | Admitting: Student in an Organized Health Care Education/Training Program

## 2022-12-27 ENCOUNTER — Encounter: Payer: Self-pay | Admitting: Student in an Organized Health Care Education/Training Program

## 2022-12-27 DIAGNOSIS — M25512 Pain in left shoulder: Secondary | ICD-10-CM

## 2022-12-27 DIAGNOSIS — G894 Chronic pain syndrome: Secondary | ICD-10-CM

## 2022-12-27 DIAGNOSIS — M25511 Pain in right shoulder: Secondary | ICD-10-CM | POA: Diagnosis not present

## 2022-12-27 DIAGNOSIS — M12811 Other specific arthropathies, not elsewhere classified, right shoulder: Secondary | ICD-10-CM | POA: Diagnosis not present

## 2022-12-27 DIAGNOSIS — Z9889 Other specified postprocedural states: Secondary | ICD-10-CM

## 2022-12-27 DIAGNOSIS — G8929 Other chronic pain: Secondary | ICD-10-CM | POA: Insufficient documentation

## 2022-12-27 DIAGNOSIS — M12812 Other specific arthropathies, not elsewhere classified, left shoulder: Secondary | ICD-10-CM

## 2022-12-27 DIAGNOSIS — M7918 Myalgia, other site: Secondary | ICD-10-CM | POA: Diagnosis not present

## 2022-12-27 NOTE — Progress Notes (Signed)
Patient: Sandy Byrd  Service Category: E/M  Provider: Gillis Santa, MD  DOB: 18-Apr-1955  DOS: 12/27/2022  Location: Office  MRN: [id number]  Setting: Ambulatory outpatient  Referring Provider: Gladstone Lighter, MD  Type: Established Patient  Specialty: Interventional Pain Management  PCP: Gladstone Lighter, MD  Location: Remote location  Delivery: TeleHealth     Virtual Encounter - Pain Management PROVIDER NOTE: Information contained herein reflects review and annotations entered in association with encounter. Interpretation of such information and data should be left to medically-trained personnel. Information provided to patient can be located elsewhere in the medical record under "Patient Instructions". Document created using STT-dictation technology, any transcriptional errors that may result from process are unintentional.    Contact & Pharmacy Preferred: [phone] Home: [phone] (home) Mobile: [phone] (mobile) E-mail: [email or web address] .[place], [place] - [place] [address] Phone: [phone] Fax: [phone]   Pre-screening  [name] offered "in-person" vs "virtual" encounter. She indicated preferring virtual for this encounter.   Reason COVID-19*  Social distancing based on CDC and AMA recommendations.   I contacted Sandy Byrd on 12/27/2022 via telephone.      I clearly identified myself as Gillis Santa, MD. I verified that I was speaking with the correct person using two identifiers (Name: Sandy Byrd, and date of birth: 09-01-1955).  Consent I sought verbal advanced consent from Sandy Byrd for virtual visit interactions. I informed Sandy Byrd of possible security and privacy concerns, risks, and limitations associated with providing "not-in-person" medical evaluation and management services. I also informed Sandy Byrd of the availability of "in-person"  appointments. Finally, I informed her that there would be a charge for the virtual visit and that she could be  personally, fully or partially, financially responsible for it. Sandy Byrd expressed understanding and agreed to proceed.   Historic Elements   Sandy Byrd is a 66 y.o. year old, female patient evaluated today after our last contact on 12/27/2022. Sandy Byrd  has no past medical history on file. She also  has no past surgical history on file. Sandy Byrd has a current medication list which includes the following prescription(s): acetaminophen, aspirin ec, celecoxib, ciprofloxacin, cyanocobalamin, cyclosporine, epinephrine, ergocalciferol, escitalopram, fluticasone, folic acid, gabapentin, hydrocodone-acetaminophen, [START ON 01/15/2023] hydrocodone-acetaminophen, ketoconazole, levothyroxine, lidocaine, lidocaine, methocarbamol, metronidazole, minocycline, propylene glycol, simvastatin, trazodone, triamcinolone cream, triamterene-hydrochlorothiazide, ventolin hfa, vitamin d (ergocalciferol), diclofenac sodium, and prednisone. She  reports that she quit smoking about 21 years ago. Her smoking use included cigarettes. She has never been exposed to tobacco smoke. She has never used smokeless tobacco. No history on file for alcohol use and drug use. Sandy Byrd is allergic to cephalexin, cephalosporins, prochlorperazine, shrimp extract allergy skin test, bee venom, latex, other, and silicone.  Estimated body mass index is 31.64 kg/m as calculated from the following:   Height as of 11/26/22: _1  (1.702 m).   Weight as of 11/26/22: 202 lb (91.6 kg).  HPI  Today, she is being contacted for worsening of previously known (established) problem  Significantly increased right greater than left shoulder pain, states that she did fall off her bed but she did not land on her shoulder States that the pain is severe and limiting her functional status significantly.  Having trouble with  ADLs.  She is utilizing a heating pad to help temporize her pain. She does have a history of bilateral rotator cuff surgery.  MRI of shoulder was over 4 years  ago. I recommend repeating bilateral shoulder MRI and I will also get the patient scheduled for a bilateral suprascapular nerve block   Laboratory Chemistry Profile   Renal No results found for: "BUN", "CREATININE", "LABCREA", "BCR", "GFR", "GFRAA", "GFRNONAA", "LABVMA", "EPIRU", "EPINEPH24HUR", "NOREPRU", "NOREPI24HUR", "DOPARU", "DOPAM24HRUR"  Hepatic No results found for: "AST", "ALT", "ALBUMIN", "ALKPHOS", "HCVAB", "AMYLASE", "LIPASE", "AMMONIA"  Electrolytes No results found for: "NA", "K", "CL", "CALCIUM", "MG", "PHOS"  Bone No results found for: "VD25OH", "VD125OH2TOT", "JO8416SA6", "TK1601UX3", "25OHVITD1", "25OHVITD2", "25OHVITD3", "TESTOFREE", "TESTOSTERONE"  Inflammation (CRP: Acute Phase) (ESR: Chronic Phase) No results found for: "CRP", "ESRSEDRATE", "LATICACIDVEN"       Note: Above Lab results reviewed.  Imaging  US Abdomen Limited RUQ (LIVER/GB) CLINICAL DATA:  Elevated LFTs.  Previous cholecystectomy.  EXAM: ULTRASOUND ABDOMEN LIMITED RIGHT UPPER QUADRANT  COMPARISON:  None Available.  FINDINGS: Gallbladder:  No gallstones or wall thickening visualized. No sonographic Murphy sign noted by sonographer.  Common bile duct:  Diameter: 3.8 mm  Liver:  Diffuse increased echogenicity throughout the liver. No focal mass. Portal vein is patent on color Doppler imaging with normal direction of blood flow towards the liver.  Other: None.  IMPRESSION: 1. Diffuse increased echogenicity throughout the liver may be seen with hepatic steatosis or other underlying intrinsic liver disease. No other abnormalities.  Electronically Signed   By: Dorise Bullion III M.D.   On: 12/13/2022 14:36  Assessment  The primary encounter diagnosis was H/O rotator cuff surgery (bilateral). Diagnoses of Chronic pain of both  shoulders (right greater than left), Rotator cuff arthropathy of both shoulders, Myofascial pain syndrome, cervical, and Chronic pain syndrome were also pertinent to this visit.  Plan of Care  1. H/O rotator cuff surgery (bilateral) - MR SHOULDER LEFT WO CONTRAST; Future - MR SHOULDER RIGHT WO CONTRAST; Future - SUPRASCAPULAR NERVE BLOCK; Future  2. Chronic pain of both shoulders (right greater than left) - MR SHOULDER LEFT WO CONTRAST; Future - MR SHOULDER RIGHT WO CONTRAST; Future - SUPRASCAPULAR NERVE BLOCK; Future  3. Rotator cuff arthropathy of both shoulders - MR SHOULDER LEFT WO CONTRAST; Future - MR SHOULDER RIGHT WO CONTRAST; Future - SUPRASCAPULAR NERVE BLOCK; Future  4. Myofascial pain syndrome, cervical  5. Chronic pain syndrome - MR SHOULDER LEFT WO CONTRAST; Future - MR SHOULDER RIGHT WO CONTRAST; Future - SUPRASCAPULAR NERVE BLOCK; Future    Orders:  Orders Placed This Encounter  Procedures   SUPRASCAPULAR NERVE BLOCK    For shoulder pain.    Standing Status:   Future    Standing Expiration Date:   03/28/2023    Scheduling Instructions:     Purpose: Diagnostic     Laterality: Bilateral     Level(s): Suprascapular notch     Sedation: Patient's choice.     Scheduling Timeframe: As permitted by the schedule    Order Specific Question:   Where will this procedure be performed?    Answer:   [facility]   MR SHOULDER LEFT WO CONTRAST    Please rule out suprascapular nerve entrapment at the suprascapular notch.    Standing Status:   Future    Standing Expiration Date:   01/27/2023    Scheduling Instructions:     Imaging must be done as soon as possible. Inform patient that order will expire within 30 days and I will not renew it.    Order Specific Question:   What is the patient's sedation requirement?    Answer:   No Sedation  Order Specific Question:   Does the patient have a pacemaker or implanted devices?    Answer:   No    Order Specific  Question:   Preferred imaging location?    Answer:   [facility] (table limit-350lbs)    Order Specific Question:   Call Results- Best Contact Number?    Answer:   [phone] ([facility])    Order Specific Question:   Radiology Contrast Protocol - do NOT remove file path    Answer:   \\[email or web address]   MR SHOULDER RIGHT WO CONTRAST    Please rule out suprascapular nerve entrapment at the suprascapular notch.    Standing Status:   Future    Standing Expiration Date:   02/27/2023    Scheduling Instructions:     Imaging must be done as soon as possible. Inform patient that order will expire within 30 days and I will not renew it.    Order Specific Question:   What is the patient's sedation requirement?    Answer:   No Sedation    Order Specific Question:   Does the patient have a pacemaker or implanted devices?    Answer:   No    Order Specific Question:   Preferred imaging location?    Answer:   [facility] (table limit-350lbs)    Order Specific Question:   Call Results- Best Contact Number?    Answer:   [phone] ([facility])    Order Specific Question:   Radiology Contrast Protocol - do NOT remove file path    Answer:   \\[email or web address]   Follow-up plan:   Return in about 11 days (around 01/07/2023) for B/L SSNB & review MRI/MM (40 mins), in clinic NS.     R L3,4,5 RFA 02/26/22; 08/29/22 repeat as needed     Recent Visits Date Type Provider Dept  11/26/22 Procedure visit Gillis Santa, MD [facility]  11/08/22 Office Visit Gillis Santa, MD [facility]  Showing recent visits within past 90 days and meeting all other requirements Today's Visits Date Type Provider Dept  12/27/22 Office Visit Gillis Santa, MD [facility]  Showing today's visits and meeting all other requirements Future Appointments Date Type Provider Dept  02/05/23 Appointment Gillis Santa,  MD [facility]  Showing future appointments within next 90 days and meeting all other requirements  I discussed the assessment and treatment plan with the patient. The patient was provided an opportunity to ask questions and all were answered. The patient agreed with the plan and demonstrated an understanding of the instructions.  Patient advised to call back or seek an in-person evaluation if the symptoms or condition worsens.  Duration of encounter: 67mnutes.  Note by: BGillis Santa MD Date: 12/27/2022; Time: 2:04 PM

## 2022-12-27 NOTE — Telephone Encounter (Signed)
PT stated that she was returning call back , PT has an VV appt today. Thanks

## 2022-12-27 NOTE — Telephone Encounter (Signed)
done

## 2023-01-01 ENCOUNTER — Ambulatory Visit
Admission: RE | Admit: 2023-01-01 | Discharge: 2023-01-01 | Disposition: A | Discharge status: Home / Self Care | Payer: Medicare Other | Source: Ambulatory Visit | Attending: Student in an Organized Health Care Education/Training Program | Admitting: Student in an Organized Health Care Education/Training Program

## 2023-01-01 ENCOUNTER — Telehealth: Payer: Self-pay

## 2023-01-01 ENCOUNTER — Telehealth: Payer: Self-pay | Admitting: Student in an Organized Health Care Education/Training Program

## 2023-01-01 DIAGNOSIS — G8929 Other chronic pain: Secondary | ICD-10-CM | POA: Insufficient documentation

## 2023-01-01 DIAGNOSIS — M25512 Pain in left shoulder: Secondary | ICD-10-CM | POA: Diagnosis present

## 2023-01-01 DIAGNOSIS — M25511 Pain in right shoulder: Secondary | ICD-10-CM | POA: Insufficient documentation

## 2023-01-01 DIAGNOSIS — Z9889 Other specified postprocedural states: Secondary | ICD-10-CM

## 2023-01-01 NOTE — Telephone Encounter (Signed)
She will need current xrays before they will approve MRI for shoulders per insurance reviewer. Also, she will need PT for this episode of pain, or documentation of home exercise program or reason why she cannot do PT.

## 2023-01-01 NOTE — Telephone Encounter (Signed)
Orders Placed This Encounter  Procedures   DG Shoulder Right    Standing Status:   Future    Standing Expiration Date:   02/01/2023    Scheduling Instructions:     Imaging must be done as soon as possible. Inform patient that order will expire within 30 days and I will not renew it.    Order Specific Question:   Reason for Exam (SYMPTOM  OR DIAGNOSIS REQUIRED)    Answer:   Right shoulder pain    Order Specific Question:   Preferred imaging location?    Answer:    [facility]    Order Specific Question:   Call Results- Best Contact Number?    Answer:   [phone]) [phone] ([facility])    Order Specific Question:   Release to patient    Answer:   Immediate   DG Shoulder Left    Standing Status:   Future    Standing Expiration Date:   02/01/2023    Scheduling Instructions:     Imaging must be done as soon as possible. Inform patient that order will expire within 30 days and I will not renew it.    Order Specific Question:   Reason for Exam (SYMPTOM  OR DIAGNOSIS REQUIRED)    Answer:   Left shoulder pain    Order Specific Question:   Preferred imaging location?    Answer:   [facility]    Order Specific Question:   Call Results- Best Contact Number?    Answer:   [phone]) [phone] ([facility])    Order Specific Question:   Release to patient    Answer:   Immediate   Ambulatory referral to Physical Therapy    Referral Priority:   Routine    Referral Type:   Physical Medicine    Referral Reason:   Specialty Services Required    Requested Specialty:   Physical Therapy    Number of Visits Requested:   1

## 2023-01-01 NOTE — Telephone Encounter (Signed)
error 

## 2023-01-09 ENCOUNTER — Ambulatory Visit
Payer: Medicare Other | Attending: Student in an Organized Health Care Education/Training Program | Admitting: Student in an Organized Health Care Education/Training Program

## 2023-01-09 ENCOUNTER — Encounter: Payer: Self-pay | Admitting: Student in an Organized Health Care Education/Training Program

## 2023-01-09 ENCOUNTER — Ambulatory Visit
Admission: RE | Admit: 2023-01-09 | Discharge: 2023-01-09 | Disposition: A | Discharge status: Home / Self Care | Payer: Medicare Other | Source: Ambulatory Visit | Attending: Student in an Organized Health Care Education/Training Program | Admitting: Student in an Organized Health Care Education/Training Program

## 2023-01-09 VITALS — BP 152/91 | HR 56 | Temp 97.2°F | Resp 13 | Ht 67.0 in | Wt 202.0 lb

## 2023-01-09 DIAGNOSIS — M47816 Spondylosis without myelopathy or radiculopathy, lumbar region: Secondary | ICD-10-CM | POA: Diagnosis not present

## 2023-01-09 DIAGNOSIS — G8929 Other chronic pain: Secondary | ICD-10-CM | POA: Diagnosis present

## 2023-01-09 DIAGNOSIS — Z9889 Other specified postprocedural states: Secondary | ICD-10-CM | POA: Insufficient documentation

## 2023-01-09 DIAGNOSIS — M25511 Pain in right shoulder: Secondary | ICD-10-CM | POA: Insufficient documentation

## 2023-01-09 DIAGNOSIS — M12811 Other specific arthropathies, not elsewhere classified, right shoulder: Secondary | ICD-10-CM | POA: Insufficient documentation

## 2023-01-09 DIAGNOSIS — M25512 Pain in left shoulder: Secondary | ICD-10-CM | POA: Diagnosis not present

## 2023-01-09 DIAGNOSIS — M12812 Other specific arthropathies, not elsewhere classified, left shoulder: Secondary | ICD-10-CM | POA: Diagnosis present

## 2023-01-09 DIAGNOSIS — G894 Chronic pain syndrome: Secondary | ICD-10-CM | POA: Insufficient documentation

## 2023-01-09 DIAGNOSIS — M7918 Myalgia, other site: Secondary | ICD-10-CM | POA: Diagnosis present

## 2023-01-09 MED ORDER — METHYLPREDNISOLONE ACETATE 40 MG/ML IJ SUSP
INTRAMUSCULAR | Status: AC
  Filled 2023-01-09: qty 1

## 2023-01-09 MED ORDER — HYDROCODONE-ACETAMINOPHEN 7.5-325 MG PO TABS: 1.0000 | ORAL_TABLET | Freq: Two times a day (BID) | ORAL | 0 refills | Status: DC

## 2023-01-09 MED ORDER — HYDROCODONE-ACETAMINOPHEN 7.5-325 MG PO TABS: 1.0000 | ORAL_TABLET | Freq: Two times a day (BID) | ORAL | 0 refills | Status: AC

## 2023-01-09 MED ORDER — LIDOCAINE HCL 2 % IJ SOLN
INTRAMUSCULAR | Status: AC
  Filled 2023-01-09: qty 20

## 2023-01-09 MED ORDER — DEXAMETHASONE SODIUM PHOSPHATE 10 MG/ML IJ SOLN
10.0000 mg | Freq: Once | INTRAMUSCULAR | Status: AC
  Administered 2023-01-09: 10 mg

## 2023-01-09 MED ORDER — IOHEXOL 180 MG/ML  SOLN
10.0000 mL | Freq: Once | INTRAMUSCULAR | Status: AC
  Administered 2023-01-09: 10 mL via INTRA_ARTICULAR

## 2023-01-09 MED ORDER — DICLOFENAC SODIUM 75 MG PO TBEC: 75.0000 mg | DELAYED_RELEASE_TABLET | Freq: Two times a day (BID) | ORAL | 2 refills | Status: DC | PRN

## 2023-01-09 MED ORDER — LIDOCAINE HCL 2 % IJ SOLN
20.0000 mL | Freq: Once | INTRAMUSCULAR | Status: AC
  Administered 2023-01-09: 400 mg

## 2023-01-09 MED ORDER — ROPIVACAINE HCL 2 MG/ML IJ SOLN
INTRAMUSCULAR | Status: AC
  Filled 2023-01-09: qty 20

## 2023-01-09 MED ORDER — DEXAMETHASONE SODIUM PHOSPHATE 10 MG/ML IJ SOLN
INTRAMUSCULAR | Status: AC
  Filled 2023-01-09: qty 1

## 2023-01-09 MED ORDER — IOHEXOL 180 MG/ML  SOLN
INTRAMUSCULAR | Status: AC
  Filled 2023-01-09: qty 20

## 2023-01-09 MED ORDER — ROPIVACAINE HCL 2 MG/ML IJ SOLN
9.0000 mL | Freq: Once | INTRAMUSCULAR | Status: AC
  Administered 2023-01-09: 9 mL via PERINEURAL

## 2023-01-09 NOTE — Progress Notes (Signed)
PROVIDER NOTE: Interpretation of information contained herein should be left to medically-trained personnel. Specific patient instructions are provided elsewhere under "Patient Instructions" section of medical record. This document was created in part using STT-dictation technology, any transcriptional errors that may result from this process are unintentional.  Patient: Sandy Byrd Type: Established DOB: 30-Jul-1955 MRN: [id number] PCP: Gladstone Lighter, MD  Service: Procedure DOS: 01/09/2023 Setting: Ambulatory Location: Ambulatory outpatient facility Delivery: Face-to-face Provider: Gillis Santa, MD Specialty: Interventional Pain Management Specialty designation: 09 Location: Outpatient facility Ref. Prov.: Gladstone Lighter, MD    Primary Reason for Visit: Interventional Pain Management Treatment. CC: Back Pain (Low back and right shoulder)  Procedure:           Type: Suprascapular nerve block (SSNB) #1  Laterality:  Bilateral Level: Superior to scapular spine, lateral to supraspinatus fossa (Suprascapular notch).  Imaging: Fluoroscopic guidance         Anesthesia: Local anesthesia (1-2% Lidocaine) DOS: 01/09/2023  Performed by: Gillis Santa, MD  Purpose: Diagnostic/Therapeutic Indications: Shoulder pain, severe enough to impact quality of life and/or function. 1. H/O rotator cuff surgery (bilateral)   2. Rotator cuff arthropathy of both shoulders   3. Chronic pain of both shoulders (right greater than left)   4. Myofascial pain syndrome, cervical   5. Lumbar facet joint syndrome   6. Chronic pain syndrome    NAS-11 score:   Pre-procedure: 8 /10   Post-procedure: 8 /10     Target: Suprascapular nerve Location: midway between the medial border of the scapula and the acromion as it runs through the suprascapular notch. Region: Suprascapular, posterior shoulder  Approach: Percutaneous  Neuroanatomy: The suprascapular nerve is the lateral branch of the superior trunk  of the brachial plexus. It receives nerve fibers that originate in the nerve roots C5 and C6 (and sometimes C4). It is a mixed nerve, meaning that it provides both sensory and motor supply for the suprascapular region. Function: The main function of this nerve is to provide motor innervation for two muscles, the supraspinatus and infraspinatus muscles. They are part of the rotator cuff muscles. In addition, the suprascapular nerve provides a sensory supply to the joints of the scapula (glenohumeral and acromioclavicular joints). Rationale (medical necessity): procedure needed and proper for the diagnosis and/or treatment of the patient's medical symptoms and needs.  Position / Prep / Materials:  Position: Prone Materials:  Tray: Block Needle(s):  Type: Spinal  Gauge (G): 25  Length: 3.5 in.  Qty: 1 Prep solution: DuraPrep (Iodine Povacrylex [0.7% available iodine] and Isopropyl Alcohol, 74% w/w) Prep Area: Entire posterior shoulder area. From upper spine to shoulder proper (upper arm), and from lateral neck to lower tip of shoulder blade.   Pre-op H&P Assessment:  Sandy Byrd is a 68 y.o. (year old), female patient, seen today for interventional treatment. She  has no past surgical history on file. Sandy Byrd has a current medication list which includes the following prescription(s): acetaminophen, aspirin ec, ciprofloxacin, cyanocobalamin, cyclosporine, diclofenac, epinephrine, ergocalciferol, escitalopram, fluticasone, folic acid, gabapentin, [START ON 01/15/2023] hydrocodone-acetaminophen, [START ON 02/14/2023] hydrocodone-acetaminophen, [START ON 03/16/2023] hydrocodone-acetaminophen, ketoconazole, levothyroxine, lidocaine, lidocaine, methocarbamol, metronidazole, minocycline, prednisone, propylene glycol, simvastatin, trazodone, triamcinolone cream, triamterene-hydrochlorothiazide, ventolin hfa, and vitamin d (ergocalciferol). Her primarily concern today is the Back Pain (Low back and right  shoulder)  Initial Vital Signs:  Pulse/HCG Rate: (!) 58  Temp: (!) 97.2 F (36.2 C) Resp: 13 BP: (!) 149/78 SpO2: 99 %  BMI: Estimated body mass index is 31.64 kg/m as  calculated from the following:   Height as of this encounter: 5\' 7"  (1.702 m).   Weight as of this encounter: 202 lb (91.6 kg).  Risk Assessment: Allergies: Reviewed. She is allergic to cephalexin, cephalosporins, prochlorperazine, shrimp extract allergy skin test, bee venom, latex, other, and silicone.  Allergy Precautions: None required Coagulopathies: Reviewed. None identified.  Blood-thinner therapy: None at this time Active Infection(s): Reviewed. None identified. Ms. Ghrist is afebrile  Site Confirmation: Sandy Byrd was asked to confirm the procedure and laterality before marking the site Procedure checklist: Completed Consent: Before the procedure and under the influence of no sedative(s), amnesic(s), or anxiolytics, the patient was informed of the treatment options, risks and possible complications. To fulfill our ethical and legal obligations, as recommended by the American Medical Association's Code of Ethics, I have informed the patient of my clinical impression; the nature and purpose of the treatment or procedure; the risks, benefits, and possible complications of the intervention; the alternatives, including doing nothing; the risk(s) and benefit(s) of the alternative treatment(s) or procedure(s); and the risk(s) and benefit(s) of doing nothing. The patient was provided information about the general risks and possible complications associated with the procedure. These may include, but are not limited to: failure to achieve desired goals, infection, bleeding, organ or nerve damage, allergic reactions, paralysis, and death. In addition, the patient was informed of those risks and complications associated to the procedure, such as failure to decrease pain; infection; bleeding; organ or nerve damage with  subsequent damage to sensory, motor, and/or autonomic systems, resulting in permanent pain, numbness, and/or weakness of one or several areas of the body; allergic reactions; (i.e.: anaphylactic reaction); and/or death. Furthermore, the patient was informed of those risks and complications associated with the medications. These include, but are not limited to: allergic reactions (i.e.: anaphylactic or anaphylactoid reaction(s)); adrenal axis suppression; blood sugar elevation that in diabetics may result in ketoacidosis or comma; water retention that in patients with history of congestive heart failure may result in shortness of breath, pulmonary edema, and decompensation with resultant heart failure; weight gain; swelling or edema; medication-induced neural toxicity; particulate matter embolism and blood vessel occlusion with resultant organ, and/or nervous system infarction; and/or aseptic necrosis of one or more joints. Finally, the patient was informed that Medicine is not an exact science; therefore, there is also the possibility of unforeseen or unpredictable risks and/or possible complications that may result in a catastrophic outcome. The patient indicated having understood very clearly. We have given the patient no guarantees and we have made no promises. Enough time was given to the patient to ask questions, all of which were answered to the patient's satisfaction. Ms. Liberati has indicated that she wanted to continue with the procedure. Attestation: I, the ordering provider, attest that I have discussed with the patient the benefits, risks, side-effects, alternatives, likelihood of achieving goals, and potential problems during recovery for the procedure that I have provided informed consent. Date  Time: 01/09/2023  9:25 AM  Pre-Procedure Preparation:  Monitoring: As per clinic protocol. Respiration, ETCO2, SpO2, BP, heart rate and rhythm monitor placed and checked for adequate function Safety  Precautions: Patient was assessed for positional comfort and pressure points before starting the procedure. Time-out: I initiated and conducted the "Time-out" before starting the procedure, as per protocol. The patient was asked to participate by confirming the accuracy of the "Time Out" information. Verification of the correct person, site, and procedure were performed and confirmed by me, the nursing staff, and the patient. "  Time-out" conducted as per Joint Commission's Universal Protocol (UP.01.01.01). Time: 1007  Description of Procedure:          Procedural Technique Safety Precautions: Aspiration looking for blood return was conducted prior to all injections. At no point did we inject any substances, as a needle was being advanced. No attempts were made at seeking any paresthesias. Safe injection practices and needle disposal techniques used. Medications properly checked for expiration dates. SDV (single dose vial) medications used. Description of the Procedure: Protocol guidelines were followed. The patient was placed in position over the procedure table. The target area was identified and the area prepped in the usual manner. Skin & deeper tissues infiltrated with local anesthetic. Appropriate amount of time allowed to pass for local anesthetics to take effect. The procedure needles were then advanced to the target area. Proper needle placement secured. Negative aspiration confirmed. Solution injected in intermittent fashion, asking for systemic symptoms every 0.5cc of injectate. The needles were then removed and the area cleansed, making sure to leave some of the prepping solution back to take advantage of its long term bactericidal properties.  8 cc solution made of 6cc of 0.2% ropivacaine, 2 cc of Decadron 10 mg/cc. 4 cc injected for the left suprascapular nerve, 4 cc injected for the right suprascapular nerve after contrast confirmation    Vitals:   01/09/23 0933 01/09/23 0957 01/09/23 1007  01/09/23 1014  BP: (!) 149/78 (!) 165/97 (!) 154/93 (!) 152/91  Pulse: (!) 58 60 (!) 59 (!) 56  Resp:  13 12 13   Temp: (!) 97.2 F (36.2 C)     TempSrc: Temporal     SpO2: 99% 100% 99% 100%  Weight: 202 lb (91.6 kg)     Height: 5\' 7"  (1.702 m)        Start Time: 1007 hrs. End Time: 1012 hrs.  Imaging Guidance (Spinal):          Type of Imaging Technique: Fluoroscopy Guidance (Spinal) Indication(s): Assistance in needle guidance and placement for procedures requiring needle placement in or near specific anatomical locations not easily accessible without such assistance. Exposure Time: Please see nurses notes. Contrast: Before injecting any contrast, we confirmed that the patient did not have an allergy to iodine, shellfish, or radiological contrast. Once satisfactory needle placement was completed at the desired level, radiological contrast was injected. Contrast injected under live fluoroscopy. No contrast complications. See chart for type and volume of contrast used. Fluoroscopic Guidance: I was personally present during the use of fluoroscopy. "Tunnel Vision Technique" used to obtain the best possible view of the target area. Parallax error corrected before commencing the procedure. "Direction-depth-direction" technique used to introduce the needle under continuous pulsed fluoroscopy. Once target was reached, antero-posterior, oblique, and lateral fluoroscopic projection used confirm needle placement in all planes. Images permanently stored in EMR. Interpretation: I personally interpreted the imaging intraoperatively. Adequate needle placement confirmed in multiple planes. Appropriate spread of contrast into desired area was observed. No evidence of afferent or efferent intravascular uptake. No intrathecal or subarachnoid spread observed. Permanent images saved into the patient's record.  Antibiotic Prophylaxis:   Anti-infectives (From admission, onward)    None      Indication(s):  None identified  Post-operative Assessment:  Post-procedure Vital Signs:  Pulse/HCG Rate: (!) 56  Temp: (!) 97.2 F (36.2 C) Resp: 13 BP: (!) 152/91 SpO2: 100 %  EBL: None  Complications: No immediate post-treatment complications observed by team, or reported by patient.  Note: The patient tolerated the  entire procedure well. A repeat set of vitals were taken after the procedure and the patient was kept under observation following institutional policy, for this type of procedure. Post-procedural neurological assessment was performed, showing return to baseline, prior to discharge. The patient was provided with post-procedure discharge instructions, including a section on how to identify potential problems. Should any problems arise concerning this procedure, the patient was given instructions to immediately contact us, at any time, without hesitation. In any case, we plan to contact the patient by telephone for a follow-up status report regarding this interventional procedure.  Comments:  No additional relevant information.  Plan of Care  Orders:  Orders Placed This Encounter  Procedures   DG PAIN CLINIC C-ARM 1-60 MIN NO REPORT    Intraoperative interpretation by procedural physician at [facility].    Standing Status:   Standing    Number of Occurrences:   1    Order Specific Question:   Reason for exam:    Answer:   Assistance in needle guidance and placement for procedures requiring needle placement in or near specific anatomical locations not easily accessible without such assistance.    Medications ordered for procedure: Meds ordered this encounter  Medications   HYDROcodone-acetaminophen (NORCO) 7.5-325 MG tablet    Sig: Take 1 tablet by mouth every 12 (twelve) hours. Must last 30 days.    Dispense:  60 tablet    Refill:  0    Chronic Pain: STOP Act (Not applicable) Fill 1 day early if closed on refill date. Avoid benzodiazepines within 8 hours of opioids    HYDROcodone-acetaminophen (NORCO) 7.5-325 MG tablet    Sig: Take 1 tablet by mouth every 12 (twelve) hours. Must last 30 days.    Dispense:  60 tablet    Refill:  0    Chronic Pain: STOP Act (Not applicable) Fill 1 day early if closed on refill date. Avoid benzodiazepines within 8 hours of opioids   HYDROcodone-acetaminophen (NORCO) 7.5-325 MG tablet    Sig: Take 1 tablet by mouth every 12 (twelve) hours. Must last 30 days.    Dispense:  60 tablet    Refill:  0    Chronic Pain: STOP Act (Not applicable) Fill 1 day early if closed on refill date. Avoid benzodiazepines within 8 hours of opioids   diclofenac (VOLTAREN) 75 MG EC tablet    Sig: Take 1 tablet (75 mg total) by mouth 2 (two) times daily as needed for mild pain.    Dispense:  60 tablet    Refill:  2   iohexol (OMNIPAQUE) 180 MG/ML injection 10 mL    Must be Myelogram-compatible. If not available, you may substitute with a water-soluble, non-ionic, hypoallergenic, myelogram-compatible radiological contrast medium.   lidocaine (XYLOCAINE) 2 % (with pres) injection 400 mg   dexamethasone (DECADRON) injection 10 mg   dexamethasone (DECADRON) injection 10 mg   ropivacaine (PF) 2 mg/mL (0.2%) (NAROPIN) injection 9 mL   Medications administered: We administered iohexol, lidocaine, dexamethasone, dexamethasone, and ropivacaine (PF) 2 mg/mL (0.2%).  See the medical record for exact dosing, route, and time of administration.  Follow-up plan:   Return in about 3 months (around 04/10/2023) for Medication Management, in person.       R L3,4,5 RFA 02/26/22; 08/29/22 repeat as needed.  Bilateral suprascapular nerve block 01/09/2023      Recent Visits Date Type Provider Dept  12/27/22 Office Visit Gillis Santa, MD [facility]  11/26/22 Procedure visit Gillis Santa, MD [facility]  11/08/22 Office Visit Edward Jolly, MD [facility]  Showing recent visits within past 90 days and meeting all other  requirements Today's Visits Date Type Provider Dept  01/09/23 Procedure visit Edward Jolly, MD [facility]  Showing today's visits and meeting all other requirements Future Appointments Date Type Provider Dept  02/20/23 Appointment Edward Jolly, MD [facility]  04/04/23 Appointment Edward Jolly, MD [facility]  Showing future appointments within next 90 days and meeting all other requirements  Disposition: Discharge home  Discharge (Date  Time): 01/09/2023; 1016 hrs.   Primary Care Physician: Enid Baas, MD Location: [facility] Note by: Edward Jolly, MD Date: 01/09/2023; Time: 10:38 AM  Disclaimer:  Medicine is not an exact science. The only guarantee in medicine is that nothing is guaranteed. It is important to note that the decision to proceed with this intervention was based on the information collected from the patient. The Data and conclusions were drawn from the patient's questionnaire, the interview, and the physical examination. Because the information was provided in large part by the patient, it cannot be guaranteed that it has not been purposely or unconsciously manipulated. Every effort has been made to obtain as much relevant data as possible for this evaluation. It is important to note that the conclusions that lead to this procedure are derived in large part from the available data. Always take into account that the treatment will also be dependent on availability of resources and existing treatment guidelines, considered by other Pain Management Practitioners as being common knowledge and practice, at the time of the intervention. For Medico-Legal purposes, it is also important to point out that variation in procedural techniques and pharmacological choices are the acceptable norm. The indications, contraindications, technique, and results of the above procedure should only be interpreted and judged by a  Board-Certified Interventional Pain Specialist with extensive familiarity and expertise in the same exact procedure and technique.

## 2023-01-09 NOTE — Progress Notes (Signed)
Nursing Pain Medication Assessment:  Safety precautions to be maintained throughout the outpatient stay will include: orient to surroundings, keep bed in low position, maintain call bell within reach at all times, provide assistance with transfer out of bed and ambulation.  Medication Inspection Compliance: Pill count conducted under aseptic conditions, in front of the patient. Neither the pills nor the bottle was removed from the patient's sight at any time. Once count was completed pills were immediately returned to the patient in their original bottle.  Medication: Oxycodone/APAP Pill/Patch Count:  18 of 60 pills remain Pill/Patch Appearance: Markings consistent with prescribed medication Bottle Appearance: Standard pharmacy container. Clearly labeled. Filled Date: 92 / 20 / 2024 Last Medication intake:  YesterdaySafety precautions to be maintained throughout the outpatient stay will include: orient to surroundings, keep bed in low position, maintain call bell within reach at all times, provide assistance with transfer out of bed and ambulation.

## 2023-01-09 NOTE — Progress Notes (Signed)
PROVIDER NOTE: Information contained herein reflects review and annotations entered in association with encounter. Interpretation of such information and data should be left to medically-trained personnel. Information provided to patient can be located elsewhere in the medical record under "Patient Instructions". Document created using STT-dictation technology, any transcriptional errors that may result from process are unintentional.    Patient: Sandy Byrd  Service Category: E/M  Provider: Edward Jolly, MD  DOB: Jun 13, 1955  DOS: 01/09/2023  Specialty: Interventional Pain Management  MRN: [id number]  Setting: Ambulatory outpatient  PCP: Enid Baas, MD  Type: Established Patient    Referring Provider: Enid Baas, MD  Location: Office  Delivery: Face-to-face     HPI  [name], a 68 y.o. year old female, is here today because of her H/O rotator cuff surgery [Z98.890]. [name]'s primary complain today is Back Pain (Low back and right shoulder) Last encounter: My last encounter with her was on 12/27/22 Pertinent problems: [name] has Lumbar facet arthropathy; Lumbar degenerative disc disease; Chronic pain syndrome; and H/O rotator cuff surgery (bilateral) on their pertinent problem list. Pain Assessment: Severity of Chronic pain is reported as a 28 /10. Location: Back Right, Left, Lower/radiates down both arms to elbows. Onset: More than a month ago. Quality: Constant, Sharp. Timing: Constant. Modifying factor(s): meds, heat. Vitals:  height is 5\' 7"  (1.702 m) and weight is 202 lb (91.6 kg). Her temporal temperature is 97.2 F (36.2 C) (abnormal). Her blood pressure is 152/91 (abnormal) and her pulse is 56 (abnormal). Her respiration is 13 and oxygen saturation is 100%.   Reason for encounter: medication management.   Patient states that she has been having more difficulty managing her low back pain as well as bilateral shoulder pain.  She is using a  lidocaine patch for her lower back, has been on Celebrex 200 mg twice a day (has renal function assessed every 6 months) also continues hydrocodone 5 mg twice a day as needed.  She states that the hydrocodone is not covering her pain and is requesting to increase to 7.5 mg twice daily.  She will also be getting a bilateral suprascapular nerve block for persistent and severe shoulder arthropathy, rotator cuff dysfunction and rotator cuff arthropathy.  See attached procedure note.   Pharmacotherapy Assessment  Analgesic:Norco 5 mg-->7.5 mg BID prn #60/month  Monitoring: [place] PMP: PDMP reviewed during this encounter.       Pharmacotherapy: No side-effects or adverse reactions reported. Compliance: No problems identified. Effectiveness: Clinically acceptable.  , RN  01/09/2023  9:40 AM  Sign when Signing Visit Nursing Pain Medication Assessment:  Safety precautions to be maintained throughout the outpatient stay will include: orient to surroundings, keep bed in low position, maintain call bell within reach at all times, provide assistance with transfer out of bed and ambulation.  Medication Inspection Compliance: Pill count conducted under aseptic conditions, in front of the patient. Neither the pills nor the bottle was removed from the patient's sight at any time. Once count was completed pills were immediately returned to the patient in their original bottle.  Medication: Oxycodone/APAP Pill/Patch Count:  18 of 60 pills remain Pill/Patch Appearance: Markings consistent with prescribed medication Bottle Appearance: Standard pharmacy container. Clearly labeled. Filled Date: 43 / 20 / 2024 Last Medication intake:  YesterdaySafety precautions to be maintained throughout the outpatient stay will include: orient to surroundings, keep bed in low position, maintain call bell within reach at all times, provide assistance with transfer out of bed  and ambulation.      UDS:  Summary  Date  Value Ref Range Status  09/18/2022 Note  Final    Comment:    ==================================================================== [employer] [facility] (MW) ==================================================================== Test                             Result       Flag       Units  Drug Present and Declared for Prescription Verification   Norhydrocodone                 119          EXPECTED   ng/mg creat    Norhydrocodone is an expected metabolite of hydrocodone.  Drug Present not Declared for Prescription Verification   Oxazepam                       27           UNEXPECTED ng/mg creat    Oxazepam may be administered as a scheduled prescription medication;    it is also an expected metabolite of other benzodiazepine drugs,    including diazepam, chlordiazepoxide, prazepam, clorazepate,    halazepam, and temazepam.  Drug Absent but Declared for Prescription Verification   Hydrocodone                    Not Detected UNEXPECTED ng/mg creat    Hydrocodone is almost always present in patients taking this drug    consistently. Absence of hydrocodone could be due to lapse of time    since the last dose or unusual pharmacokinetics (rapid metabolism).  ==================================================================== Test                      Result    Flag   Units      Ref Range   Creatinine              73               mg/dL      >=20 ==================================================================== Declared Medications:  The flagging and interpretation on this report are based on the  following declared medications.  Unexpected results may arise from  inaccuracies in the declared medications.   **Note: The testing scope of this panel includes these medications:   Hydrocodone (Norco)   **Note: The testing scope of this panel does not include the  following reported medications:   Acetaminophen (Tylenol)  Acetaminophen (Norco)  Albuterol (Ventolin HFA)  Aspirin   Celecoxib (Celebrex)  Cyanocobalamin  Diclofenac (Voltaren)  Epinephrine (EpiPen)  Escitalopram (Lexapro)  Eye Drop  Fluticasone (Flonase)  Folic Acid  Gabapentin (Neurontin)  Hydrochlorothiazide (Dyazide)  Ketoconazole (Nizoral)  Levothyroxine (Synthroid)  Methocarbamol (Robaxin)  Metronidazole (MetroGel)  Minocycline (Minocin)  Polyethylene Glycol  Simvastatin (Zocor)  Topical Lidocaine (Lidoderm)  Trazodone (Desyrel)  Triamcinolone (Kenalog)  Triamterene (Dyazide)  Vitamin D2 ==================================================================== For clinical consultation, please call [phone]. ====================================================================      ROS  Constitutional: Denies any fever or chills Gastrointestinal: No reported hemesis, hematochezia, vomiting, or acute GI distress Musculoskeletal:  Bilateral shoulder pain, low back pain Neurological: No reported episodes of acute onset apraxia, aphasia, dysarthria, agnosia, amnesia, paralysis, loss of coordination, or loss of consciousness  Medication Review  EPINEPHrine, HYDROcodone-acetaminophen, Propylene Glycol, Vitamin D (Ergocalciferol), acetaminophen, albuterol, aspirin EC, ciprofloxacin, cyanocobalamin, cycloSPORINE, diclofenac, ergocalciferol, escitalopram, fluticasone, folic acid, gabapentin, ketoconazole, levothyroxine, lidocaine,  methocarbamol, metroNIDAZOLE, minocycline, predniSONE, simvastatin, traZODone, triamcinolone cream, and triamterene-hydrochlorothiazide  History Review  Allergy: [name] is allergic to cephalexin, cephalosporins, prochlorperazine, shrimp extract allergy skin test, bee venom, latex, other, and silicone. Drug: [name]  has no history on file for drug use. Alcohol:  has no history on file for alcohol use. Tobacco:  reports that she quit smoking about 21 years ago. Her smoking use included cigarettes. She has never been exposed to tobacco smoke. She has  never used smokeless tobacco. Social: [name]  reports that she quit smoking about 21 years ago. Her smoking use included cigarettes. She has never been exposed to tobacco smoke. She has never used smokeless tobacco. Medical:  has no past medical history on file. Surgical: [name]  has no past surgical history on file. Family: family history is not on file.   Physical Exam  General appearance: Well nourished, well developed, and well hydrated. In no apparent acute distress Mental status: Alert, oriented x 3 (person, place, & time)       Respiratory: No evidence of acute respiratory distress Eyes: PERLA Vitals: BP (!) 152/91   Pulse (!) 56   Temp (!) 97.2 F (36.2 C) (Temporal)   Resp 13   Ht 5\' 7"  (1.702 m)   Wt 202 lb (91.6 kg)   SpO2 100%   BMI 31.64 kg/m  BMI: Estimated body mass index is 31.64 kg/m as calculated from the following:   Height as of this encounter: 5\' 7"  (1.702 m).   Weight as of this encounter: 202 lb (91.6 kg). Ideal: Ideal body weight: 61.6 kg (135 lb 12.9 oz) Adjusted ideal body weight: 73.6 kg (162 lb 4.5 oz)  Bilateral deltoid tenderness, history of prior arthroscopic shoulder surgery.  Lumbar Spine Area Exam  Skin & Axial Inspection: No masses, redness, or swelling Alignment: Symmetrical Functional ROM: Decreased ROM on the right greater than left       Stability: No instability detected Muscle Tone/Strength: Functionally intact. No obvious neuro-muscular anomalies detected. Sensory (Neurological): Musculoskeletal pain pattern after treatment on the right  Gait & Posture Assessment  Ambulation: Unassisted Gait: Relatively normal for age and body habitus Posture: WNL  Lower Extremity Exam    Side: Right lower extremity  Side: Left lower extremity  Stability: No instability observed          Stability: No instability observed          Skin & Extremity Inspection: Skin color, temperature, and hair growth are WNL. No peripheral edema or  cyanosis. No masses, redness, swelling, asymmetry, or associated skin lesions. No contractures.  Skin & Extremity Inspection: Skin color, temperature, and hair growth are WNL. No peripheral edema or cyanosis. No masses, redness, swelling, asymmetry, or associated skin lesions. No contractures.  Functional ROM: Unrestricted ROM                  Functional ROM: Unrestricted ROM                  Muscle Tone/Strength: Functionally intact. No obvious neuro-muscular anomalies detected.  Muscle Tone/Strength: Functionally intact. No obvious neuro-muscular anomalies detected.  Sensory (Neurological): Unimpaired        Sensory (Neurological): Unimpaired        DTR: Patellar: deferred today Achilles: deferred today Plantar: deferred today  DTR: Patellar: deferred today Achilles: deferred today Plantar: deferred today  Palpation: No palpable anomalies  Palpation: No palpable anomalies    Assessment   Diagnosis Status  1.  H/O rotator cuff surgery (bilateral)   2. Rotator cuff arthropathy of both shoulders   3. Chronic pain of both shoulders (right greater than left)   4. Myofascial pain syndrome, cervical   5. Lumbar facet joint syndrome   6. Chronic pain syndrome    Controlled Controlled Controlled     Plan of Care   [name] has a current medication list which includes the following long-term medication(s): escitalopram, fluticasone, gabapentin, levothyroxine, simvastatin, trazodone, triamterene-hydrochlorothiazide, and ventolin hfa.  1. H/O rotator cuff surgery (bilateral)  2. Rotator cuff arthropathy of both shoulders  3. Chronic pain of both shoulders (right greater than left)  4. Myofascial pain syndrome, cervical  5. Lumbar facet joint syndrome  6. Chronic pain syndrome  Instructed patient to discontinue Celebrex, start diclofenac as below.  Encouraged water intake while she is taking NSAID.  Caution her on GI risk and risk of chronic kidney injury with chronic  NSAID therapy. See attached procedure note for bilateral suprascapular nerve block.  If helpful, we discussed suprascapular nerve stimulation versus pulsed radiofrequency ablation.  Pharmacotherapy (Medications Ordered): Meds ordered this encounter  Medications   HYDROcodone-acetaminophen (NORCO) 7.5-325 MG tablet    Sig: Take 1 tablet by mouth every 12 (twelve) hours. Must last 30 days.    Dispense:  60 tablet    Refill:  0    Chronic Pain: STOP Act (Not applicable) Fill 1 day early if closed on refill date. Avoid benzodiazepines within 8 hours of opioids   HYDROcodone-acetaminophen (NORCO) 7.5-325 MG tablet    Sig: Take 1 tablet by mouth every 12 (twelve) hours. Must last 30 days.    Dispense:  60 tablet    Refill:  0    Chronic Pain: STOP Act (Not applicable) Fill 1 day early if closed on refill date. Avoid benzodiazepines within 8 hours of opioids   HYDROcodone-acetaminophen (NORCO) 7.5-325 MG tablet    Sig: Take 1 tablet by mouth every 12 (twelve) hours. Must last 30 days.    Dispense:  60 tablet    Refill:  0    Chronic Pain: STOP Act (Not applicable) Fill 1 day early if closed on refill date. Avoid benzodiazepines within 8 hours of opioids   diclofenac (VOLTAREN) 75 MG EC tablet    Sig: Take 1 tablet (75 mg total) by mouth 2 (two) times daily as needed for mild pain.    Dispense:  60 tablet    Refill:  2   iohexol (OMNIPAQUE) 180 MG/ML injection 10 mL    Must be Myelogram-compatible. If not available, you may substitute with a water-soluble, non-ionic, hypoallergenic, myelogram-compatible radiological contrast medium.   lidocaine (XYLOCAINE) 2 % (with pres) injection 400 mg   dexamethasone (DECADRON) injection 10 mg   dexamethasone (DECADRON) injection 10 mg   ropivacaine (PF) 2 mg/mL (0.2%) (NAROPIN) injection 9 mL   Orders:  Orders Placed This Encounter  Procedures   DG PAIN CLINIC C-ARM 1-60 MIN NO REPORT    Intraoperative interpretation by procedural physician at  [facility].    Standing Status:   Standing    Number of Occurrences:   1    Order Specific Question:   Reason for exam:    Answer:   Assistance in needle guidance and placement for procedures requiring needle placement in or near specific anatomical locations not easily accessible without such assistance.   Follow-up plan:   Return in about 3 months (around 04/10/2023) for Medication Management, in person.  R L3,4,5 RFA 02/26/22; 08/29/22 repeat as needed, B/L SSNB 01/09/23   Recent Visits Date Type Provider Dept  12/27/22 Office Visit Edward Jolly, MD [facility]  11/26/22 Procedure visit Edward Jolly, MD [facility]  11/08/22 Office Visit Edward Jolly, MD [facility]  Showing recent visits within past 90 days and meeting all other requirements Today's Visits Date Type Provider Dept  01/09/23 Procedure visit Edward Jolly, MD [facility]  Showing today's visits and meeting all other requirements Future Appointments Date Type Provider Dept  02/20/23 Appointment Edward Jolly, MD [facility]  04/04/23 Appointment Edward Jolly, MD [facility]  Showing future appointments within next 90 days and meeting all other requirements  I discussed the assessment and treatment plan with the patient. The patient was provided an opportunity to ask questions and all were answered. The patient agreed with the plan and demonstrated an understanding of the instructions.  Patient advised to call back or seek an in-person evaluation if the symptoms or condition worsens.  Duration of encounter:33minutes.  Note by: Edward Jolly, MD Date: 01/09/2023; Time: 10:35 AM

## 2023-01-09 NOTE — Patient Instructions (Signed)

## 2023-01-10 ENCOUNTER — Telehealth: Payer: Self-pay

## 2023-01-10 NOTE — Telephone Encounter (Signed)
Post procedure follow up.  Patient states she is doing good.  

## 2023-01-31 ENCOUNTER — Encounter: Payer: Medicare Other | Admitting: Student in an Organized Health Care Education/Training Program

## 2023-02-02 ENCOUNTER — Other Ambulatory Visit: Payer: Self-pay | Admitting: Student in an Organized Health Care Education/Training Program

## 2023-02-05 ENCOUNTER — Encounter: Payer: Medicare Other | Admitting: Student in an Organized Health Care Education/Training Program

## 2023-02-06 ENCOUNTER — Telehealth: Payer: Self-pay | Admitting: Student in an Organized Health Care Education/Training Program

## 2023-02-06 NOTE — Telephone Encounter (Signed)
Patient is wanting to have another RFA and injection in right knee. She had injections in shoulder last time. Has a PPE on 02-20-23 for that procedure.

## 2023-02-20 ENCOUNTER — Ambulatory Visit
Payer: Medicare Other | Attending: Student in an Organized Health Care Education/Training Program | Admitting: Student in an Organized Health Care Education/Training Program

## 2023-02-20 DIAGNOSIS — M5136 Other intervertebral disc degeneration, lumbar region: Secondary | ICD-10-CM

## 2023-02-20 DIAGNOSIS — G8929 Other chronic pain: Secondary | ICD-10-CM

## 2023-02-20 DIAGNOSIS — M1711 Unilateral primary osteoarthritis, right knee: Secondary | ICD-10-CM

## 2023-02-20 DIAGNOSIS — M47816 Spondylosis without myelopathy or radiculopathy, lumbar region: Secondary | ICD-10-CM | POA: Diagnosis not present

## 2023-02-20 DIAGNOSIS — M12812 Other specific arthropathies, not elsewhere classified, left shoulder: Secondary | ICD-10-CM

## 2023-02-20 DIAGNOSIS — Z9889 Other specified postprocedural states: Secondary | ICD-10-CM

## 2023-02-20 DIAGNOSIS — M12811 Other specific arthropathies, not elsewhere classified, right shoulder: Secondary | ICD-10-CM | POA: Diagnosis not present

## 2023-02-20 DIAGNOSIS — M51369 Other intervertebral disc degeneration, lumbar region without mention of lumbar back pain or lower extremity pain: Secondary | ICD-10-CM

## 2023-02-20 DIAGNOSIS — G894 Chronic pain syndrome: Secondary | ICD-10-CM

## 2023-02-20 NOTE — Progress Notes (Signed)
Patient: Sandy Byrd  Service Category: E/M  Provider: Gillis Santa, MD  DOB: Jun 28, 1955  DOS: 02/20/2023  Location: Office  MRN: [id number]  Setting: Ambulatory outpatient  Referring Provider: Gladstone Lighter, MD  Type: Established Patient  Specialty: Interventional Pain Management  PCP: Gladstone Lighter, MD  Location: Remote location  Delivery: TeleHealth     Virtual Encounter - Pain Management PROVIDER NOTE: Information contained herein reflects review and annotations entered in association with encounter. Interpretation of such information and data should be left to medically-trained personnel. Information provided to patient can be located elsewhere in the medical record under "Patient Instructions". Document created using STT-dictation technology, any transcriptional errors that may result from process are unintentional.    Contact & Pharmacy Preferred: [phone] Home: [phone] (home) Mobile: [phone] (mobile) E-mail: [email or web address] .[place], [place] [address] [place] [address] Phone: [phone] Fax: [phone]   Pre-screening  Ms. Carrie Mew offered "in-person" vs "virtual" encounter. She indicated preferring virtual for this encounter.   Reason COVID-19*  Social distancing based on CDC and AMA recommendations.   I contacted [name] on 02/20/2023 via telephone.      I clearly identified myself as Gillis Santa, MD. I verified that I was speaking with the correct person using two identifiers (Name: SHOLA GOCHEZ, and date of birth: 1955/11/22).  Consent I sought verbal advanced consent from [name] for virtual visit interactions. I informed Ms. Ridgell of possible security and privacy concerns, risks, and limitations associated with providing "not-in-person" medical evaluation and management services. I also informed Ms. Timmermans of the availability of "in-person" appointments.  Finally, I informed her that there would be a charge for the virtual visit and that she could be  personally, fully or partially, financially responsible for it. Ms. Hull expressed understanding and agreed to proceed.   Historic Elements   Ms. KAMAREE SCHURZ is a 68 y.o. year old, female patient evaluated today after our last contact on 02/06/2023. Ms. Shumaker  has no past medical history on file. She also  has no past surgical history on file. Ms. Kitsmiller has a current medication list which includes the following prescription(s): acetaminophen, cyanocobalamin, cyclosporine, diclofenac, epinephrine, ergocalciferol, escitalopram, fluticasone, folic acid, gabapentin, hydrocodone-acetaminophen, [START ON 03/16/2023] hydrocodone-acetaminophen, ketoconazole, levothyroxine, lidocaine, methocarbamol, metronidazole, minocycline, prednisone, propylene glycol, trazodone, triamcinolone cream, triamterene-hydrochlorothiazide, ventolin hfa, vitamin d (ergocalciferol), ciprofloxacin, lidocaine, and simvastatin. She  reports that she quit smoking about 21 years ago. Her smoking use included cigarettes. She has never been exposed to tobacco smoke. She has never used smokeless tobacco. No history on file for alcohol use and drug use. Ms. Cashman is allergic to cephalexin, cephalosporins, prochlorperazine, shrimp extract allergy skin test, bee venom, latex, other, and silicone.  Estimated body mass index is 31.64 kg/m as calculated from the following:   Height as of 01/09/23: 5' 7"$  (1.702 m).   Weight as of 01/09/23: 202 lb (91.6 kg).  HPI  Today, she is being contacted for a post-procedure assessment.   Post-procedure evaluation   Type: Suprascapular nerve block (SSNB) #1  Laterality:  Bilateral Level: Superior to scapular spine, lateral to supraspinatus fossa (Suprascapular notch).  Imaging: Fluoroscopic guidance         Anesthesia: Local anesthesia (1-2% Lidocaine) DOS: 01/09/2023  Performed by:  Gillis Santa, MD  Purpose: Diagnostic/Therapeutic Indications: Shoulder pain, severe enough to impact quality of life and/or function. 1. H/O rotator cuff surgery (bilateral)   2. Rotator cuff arthropathy  of both shoulders   3. Chronic pain of both shoulders (right greater than left)   4. Myofascial pain syndrome, cervical   5. Lumbar facet joint syndrome   6. Chronic pain syndrome    NAS-11 score:   Pre-procedure: 8 /10   Post-procedure: 8 /10      Effectiveness:  Initial hour after procedure: 75 %  Subsequent 4-6 hours post-procedure: 75 %  Analgesia past initial 6 hours: 75 % (ongoing)  Ongoing improvement:  Analgesic:  50% Function: Somewhat improved ROM: Somewhat improved     Laboratory Chemistry Profile   Renal No results found for: "BUN", "CREATININE", "LABCREA", "BCR", "GFR", "GFRAA", "GFRNONAA", "LABVMA", "EPIRU", "EPINEPH24HUR", "NOREPRU", "NOREPI24HUR", "DOPARU", "DOPAM24HRUR"  Hepatic No results found for: "AST", "ALT", "ALBUMIN", "ALKPHOS", "HCVAB", "AMYLASE", "LIPASE", "AMMONIA"  Electrolytes No results found for: "NA", "K", "CL", "CALCIUM", "MG", "PHOS"  Bone No results found for: "VD25OH", "VD125OH2TOT", "[id number]", "UK:[id number]", "25OHVITD1", "25OHVITD2", "25OHVITD3", "TESTOFREE", "TESTOSTERONE"  Inflammation (CRP: Acute Phase) (ESR: Chronic Phase) No results found for: "CRP", "ESRSEDRATE", "LATICACIDVEN"       Note: Above Lab results reviewed.  Imaging   05/03/2021 12:40 PM   HISTORY/REASON FOR EXAM:  Chronic low back pain and right leg pain.    TECHNIQUE/EXAM DESCRIPTION:  MRI of the lumbar spine without contrast.   The study was performed on a G.E. Signa 1.5 Tesla MRI scanner.  T1 sagittal, T2 fast spin-echo sagittal, and T2 axial images were obtained of the lumbar spine.   COMPARISON:  None.   FINDINGS:  The lumbar spine maintains normal height and alignment. There is no fracture or dislocation. There is no pathologic marrow infiltration.    At the level of L5-S1,there is minimal disc bulge without significant spinal or neural foraminal stenosis. There is mild bilateral facet joint arthropathy.   At the level of L4-5,there is disc degeneration. There is no spinal or neural foraminal stenosis.   At the level of L3-4,there is minimal disc bulge. Bilateral facet joint arthropathy is seen. There is no spinal or neural foraminal stenosis.   At the level of the L2-3,there is no spinal or neural foraminal stenosis.   At the level of L1-2,there is no spinal or neural foraminal stenosis.   The conus terminates at the level of L1. The visualized lower thoracic spinal cord is unremarkable. There is no lumbar intradural lesion.   The visualized pre-and paraspinal soft tissues appear normal.   Assessment  The primary encounter diagnosis was H/O rotator cuff surgery (bilateral). Diagnoses of Rotator cuff arthropathy of both shoulders, Lumbar facet joint syndrome, Lumbar facet arthropathy, Primary osteoarthritis of right knee, Chronic pain of right knee, Chronic pain syndrome, and Lumbar degenerative disc disease were also pertinent to this visit.  Plan of Care  1. H/O rotator cuff surgery (bilateral) -Positive response after bilateral suprascapular nerve blocks.  Endorses ongoing pain relief that is rated at 50%.  For the first 4 weeks after her procedure, she rated her pain relief at approximately 75 to 80%.  We discussed neck steps which could include repeating suprascapular nerve block, radiofrequency ablation of the suprascapular nerve, and even suprascapular nerve stimulation.  She states that she will continue to monitor her symptoms and will reach out for potentially suprascapular nerve RFA if her pain returns.  2. Rotator cuff arthropathy of both shoulders -As above  3. Lumbar facet joint syndrome -Patient continues to endorse right low back pain that is severe and also new onset axial low back pain.  This is worse with  lumbar  extension.  She has had a right lumbar radiofrequency ablation at L3, L4, L5 which was her second 1 08/29/2022.  She states that she is having pain that is impacting her quality of life and she has difficulty standing and walking.  She states that she is ambulating with a walker.  Her previous MRI as above which shows disc degeneration and bilateral facet joint arthropathy however given the extent of her pain there could have been disease progression and also potentially development of foraminal stenosis or canal stenosis that is contributing to her difficulty walking.  Recommend further diagnostic workup via lumbar MRI without contrast. - MR LUMBAR SPINE WO CONTRAST; Future  4. Lumbar facet arthropathy - MR LUMBAR SPINE WO CONTRAST; Future  5. Primary osteoarthritis of right knee -Endorses severe right knee pain.  She is status post right knee intra-articular steroid injection on 07/18/2022 that did provide her with some pain relief.  She states that she has pain with weightbearing and that the pain is severe.  Recommend right knee MRI to evaluate for any ligamentous damage and or tearing as well as meniscus pathology. - MR KNEE RIGHT WO CONTRAST; Future  6. Chronic pain of right knee - MR KNEE RIGHT WO CONTRAST; Future  7. Chronic pain syndrome -Continue with multimodal analgesics as prescribed - MR LUMBAR SPINE WO CONTRAST; Future - MR KNEE RIGHT WO CONTRAST; Future  Patient will follow-up with me face-to-face after she has completed her lumbar MRI and right knee MRI.   Orders:  Orders Placed This Encounter  Procedures   MR LUMBAR SPINE WO CONTRAST    Patient presents with axial pain with possible radicular component. Please assist [place] in identifying specific level(s) and laterality of any additional findings such as: 1. Facet (Zygapophyseal) joint DJD (Hypertrophy, space narrowing, subchondral sclerosis, and/or osteophyte formation) 2. DDD and/or IVDD (Loss of disc height, desiccation,  gas patterns, osteophytes, endplate sclerosis, or "Black disc disease") 3. Pars defects 4. Spondylolisthesis, spondylosis, and/or spondyloarthropathies (include Degree/Grade of displacement in mm) (stability) 5. Vertebral body Fractures (acute/chronic) (state percentage of collapse) 6. Demineralization (osteopenia/osteoporotic) 7. Bone pathology 8. Foraminal narrowing  9. Surgical changes 10. Central, Lateral Recess, and/or Foraminal Stenosis (include AP diameter of stenosis in mm) 11. Surgical changes (hardware type, status, and presence of fibrosis) 12. Modic Type Changes (MRI only) 13. IVDD (Disc bulge, protrusion, herniation, extrusion) (Level, laterality, extent)    Standing Status:   Future    Standing Expiration Date:   03/21/2023    Scheduling Instructions:     Please make sure that the patient understands that this needs to be done as soon as possible. Never have the patient do the imaging "just before the next appointment". Inform patient that having the imaging done within the [facility] will expedite the availability of the results and will provide      imaging availability to the requesting physician. In addition inform the patient that the imaging order has an expiration date and will not be renewed if not done within the active period.    Order Specific Question:   What is the patient's sedation requirement?    Answer:   No Sedation    Order Specific Question:   Does the patient have a pacemaker or implanted devices?    Answer:   No    Order Specific Question:   Preferred imaging location?    Answer:   [facility] (table limit-350lbs)    Order Specific Question:   Call Results-  Best Contact Number?    Answer:   [phone] ([facility])    Order Specific Question:   Radiology Contrast Protocol - do NOT remove file path    Answer:   \\[email or web address]   MR KNEE RIGHT WO CONTRAST    Standing Status:   Future    Standing Expiration  Date:   03/21/2023    Scheduling Instructions:     Please make sure that the patient understands that this needs to be done as soon as possible. Never have the patient do the imaging "just before the next appointment". Inform patient that having the imaging done within the [facility] will expedite the availability of the results and will provide      imaging availability to the requesting physician. In addition inform the patient that the imaging order has an expiration date and will not be renewed if not done within the active period.    Order Specific Question:   What is the patient's sedation requirement?    Answer:   No Sedation    Order Specific Question:   Does the patient have a pacemaker or implanted devices?    Answer:   No    Order Specific Question:   Preferred imaging location?    Answer:   [facility] (table limit-350lbs)    Order Specific Question:   Call Results- Best Contact Number?    Answer:   [phone] ([facility])    Order Specific Question:   Radiology Contrast Protocol - do NOT remove file path    Answer:   \\[email or web address].PDF   Follow-up plan:   Return for  , patient will call to schedule F2F appt after completing her MRI.      R L3,4,5 RFA 02/26/22; 08/29/22 repeat as needed.  Bilateral suprascapular nerve block 01/09/2023        Recent Visits Date Type Provider Dept  01/09/23 Procedure visit Gillis Santa, MD [facility]  12/27/22 Office Visit Gillis Santa, MD [facility]  11/26/22 Procedure visit Gillis Santa, MD [facility]  Showing recent visits within past 90 days and meeting all other requirements Today's Visits Date Type Provider Dept  02/20/23 Office Visit Gillis Santa, MD [facility]  Showing today's visits and meeting all other requirements Future Appointments Date Type Provider Dept  04/02/23 Appointment Gillis Santa, MD [facility]  Showing future  appointments within next 90 days and meeting all other requirements  I discussed the assessment and treatment plan with the patient. The patient was provided an opportunity to ask questions and all were answered. The patient agreed with the plan and demonstrated an understanding of the instructions.  Patient advised to call back or seek an in-person evaluation if the symptoms or condition worsens.  Duration of encounter: 28mnutes.  Note by: BGillis Santa MD Date: 02/20/2023; Time: 3:18 PM

## 2023-02-21 ENCOUNTER — Telehealth: Payer: Self-pay

## 2023-02-21 DIAGNOSIS — F411 Generalized anxiety disorder: Secondary | ICD-10-CM

## 2023-02-21 MED ORDER — DIAZEPAM 5 MG PO TABS: 5.0000 mg | ORAL_TABLET | ORAL | 0 refills | Status: DC

## 2023-02-21 NOTE — Telephone Encounter (Signed)
I scheduled her MRI's and she will need a valium called out to her pharmacy. Scheduled for 03/05/23

## 2023-02-21 NOTE — Telephone Encounter (Signed)
Will you do?

## 2023-02-21 NOTE — Telephone Encounter (Signed)
Patient informed. 

## 2023-03-03 ENCOUNTER — Ambulatory Visit
Admission: EM | Admit: 2023-03-03 | Discharge: 2023-03-03 | Disposition: A | Discharge status: Home / Self Care | Payer: Medicare Other | Attending: Urgent Care | Admitting: Urgent Care

## 2023-03-03 ENCOUNTER — Encounter: Payer: Self-pay | Admitting: Emergency Medicine

## 2023-03-03 DIAGNOSIS — B9689 Other specified bacterial agents as the cause of diseases classified elsewhere: Secondary | ICD-10-CM | POA: Diagnosis not present

## 2023-03-03 DIAGNOSIS — J019 Acute sinusitis, unspecified: Secondary | ICD-10-CM | POA: Diagnosis not present

## 2023-03-03 DIAGNOSIS — R051 Acute cough: Secondary | ICD-10-CM

## 2023-03-03 HISTORY — DX: Disorder of thyroid, unspecified: E07.9

## 2023-03-03 HISTORY — DX: Unspecified osteoarthritis, unspecified site: M19.90

## 2023-03-03 HISTORY — DX: Depression, unspecified: F32.A

## 2023-03-03 HISTORY — DX: Rosacea, unspecified: L71.9

## 2023-03-03 HISTORY — DX: Essential (primary) hypertension: I10

## 2023-03-03 HISTORY — DX: Anxiety disorder, unspecified: F41.9

## 2023-03-03 MED ORDER — AMOXICILLIN-POT CLAVULANATE 875-125 MG PO TABS: 1.0000 | ORAL_TABLET | Freq: Two times a day (BID) | ORAL | 0 refills | Status: DC

## 2023-03-03 MED ORDER — BENZONATATE 100 MG PO CAPS: ORAL_CAPSULE | ORAL | 0 refills | Status: DC

## 2023-03-03 NOTE — ED Provider Notes (Addendum)
Roderic Palau    CSN: [id number] Arrival date & time: 03/03/23  0847      History   Chief Complaint Chief Complaint  Patient presents with   Cough   Wheezing    HPI Sandy Byrd is a 68 y.o. female.    Cough Associated symptoms: wheezing   Wheezing Associated symptoms: cough     Patient presents with symptoms of cough, wheezing, sinus pressure.  She states sinus pressure x 3 weeks, primarily on the right side of her forehead and below her eye.  She says it is painful to touch.  Cough with wheezing times about 1 week.  She states she has an inhaler for history of wheezing but denies asthma or COPD.  No fever, chills, myalgias.  No NVD.  Past Medical History:  Diagnosis Date   Anxiety    Arthritis    Depression    Hypertension    Rosacea    Thyroid disease     Patient Active Problem List   Diagnosis Date Noted   Primary osteoarthritis of right knee 02/20/2023   Chronic pain of both shoulders (right greater than left) 12/27/2022   Rotator cuff arthropathy of both shoulders 12/27/2022   Myofascial pain syndrome, cervical 12/27/2022   Chronic pain of right knee 06/26/2022   Lumbar facet joint syndrome 02/08/2022   Lumbar degenerative disc disease 02/08/2022   Chronic pain syndrome 02/08/2022   H/O rotator cuff surgery (bilateral) 02/08/2022    Past Surgical History:  Procedure Laterality Date   SHOULDER SURGERY Right 2016   SHOULDER SURGERY Right 2017    OB History   No obstetric history on file.      Home Medications    Prior to Admission medications   Medication Sig Start Date End Date Taking? Authorizing Provider  diazepam (VALIUM) 5 MG tablet Take 1 tablet (5 mg total) by mouth 60 (sixty) minutes before procedure for 1 dose. 1 tab PO 60 min pre-MRI. If still anxious, take 2nd tab 15 min just before MRI. Max: 2 tbs (10 mg). Avoid taking opioid pain medications within 4 hours of taking valium. Must have a driver. Do not drive or operate  machinery x 24 hours after taking this medication. 02/21/23   Gillis Santa, MD  simvastatin (ZOCOR) 40 MG tablet Take 1 tablet by mouth at bedtime. 11/14/21 03/03/23 Yes [provider]  acetaminophen (TYLENOL) 500 MG tablet Take by mouth.    [provider]  ciprofloxacin (CIPRO) 500 MG tablet Take 500 mg by mouth 2 (two) times daily. Patient not taking: Reported on 02/19/2023    [provider]  cyanocobalamin 1000 MCG tablet Take by mouth.    [provider]  cycloSPORINE (RESTASIS) 0.05 % ophthalmic emulsion Place 1 drop into both eyes 2 (two) times daily.    [provider]  diclofenac (VOLTAREN) 75 MG EC tablet Take 1 tablet (75 mg total) by mouth 2 (two) times daily as needed for mild pain. 01/09/23   Gillis Santa, MD  EPINEPHrine 0.3 mg/0.3 mL IJ SOAJ injection SMARTSIG:1 Pre-Filled Pen Syringe IM PRN 11/15/21   [provider]  ergocalciferol (VITAMIN D2) 1.25 MG (50000 UT) capsule Take by mouth. 11/16/21   [provider]  escitalopram (LEXAPRO) 20 MG tablet Take by mouth. 05/09/21   [provider]  fluticasone (FLONASE) 50 MCG/ACT nasal spray Place 2 sprays into both nostrils 2 (two) times daily.    [provider]  folic acid (FOLVITE) 1 MG tablet  Take by mouth.    [provider]  gabapentin (NEURONTIN) 800 MG tablet Take 1 tablet by mouth 3 (three) times daily. 08/18/21   [provider]  HYDROcodone-acetaminophen (NORCO) 7.5-325 MG tablet Take 1 tablet by mouth every 12 (twelve) hours. Must last 30 days. 02/14/23 03/16/23  Gillis Santa, MD  HYDROcodone-acetaminophen (NORCO) 7.5-325 MG tablet Take 1 tablet by mouth every 12 (twelve) hours. Must last 30 days. 03/16/23 04/15/23  Gillis Santa, MD  ketoconazole (NIZORAL) 2 % shampoo Apply 1 Application topically as needed. 09/17/22   [provider]  levothyroxine (SYNTHROID) 100 MCG tablet Take by mouth. 11/14/21   [provider]   lidocaine (LIDODERM) 5 % Place 1 patch onto the skin as needed. 4%. Patient not taking: Reported on 02/19/2023    [provider]  lidocaine (LIDODERM) 5 % Place 1 patch onto the skin every 12 (twelve) hours. Remove & Discard patch within 12 hours or as directed by MD 11/08/22 11/08/23  Gillis Santa, MD  methocarbamol (ROBAXIN) 500 MG tablet TAKE 1 TABLET BY MOUTH TWICE DAILY AS NEEDED FOR SPASMS 07/24/21   [provider]  metroNIDAZOLE (METROGEL) 1 % gel Apply topically daily. 01/24/22   [provider]  minocycline (MINOCIN) 100 MG capsule Take by mouth. 08/19/21   [provider]  predniSONE (DELTASONE) 10 MG tablet Take 10 mg by mouth taper from 4 doses each day to 1 dose and stop.    [provider]  Propylene Glycol (SYSTANE BALANCE OP) Apply to eye.    [provider]  traZODone (DESYREL) 100 MG tablet Take 100 mg by mouth at bedtime. 06/14/22   [provider]  triamcinolone cream (KENALOG) 0.1 % Apply topically 2 (two) times daily as needed. 01/24/22   [provider]  triamterene-hydrochlorothiazide (DYAZIDE) 37.5-25 MG capsule Take 1 capsule by mouth daily. 11/14/21   [provider]  VENTOLIN HFA 108 (90 Base) MCG/ACT inhaler SMARTSIG:2 Puff(s) By Mouth Every 4 Hours PRN 11/15/21   [provider]  Vitamin D, Ergocalciferol, (DRISDOL) 1.25 MG (50000 UNIT) CAPS capsule Take 1 capsule by mouth once a week. 11/26/22   [provider]    Family History No family history on file.  Social History Social History   Tobacco Use   Smoking status: Former    Types: Cigarettes    Quit date: 2003    Years since quitting: 21.1    Passive exposure: Never   Smokeless tobacco: Never  Vaping Use   Vaping Use: Never used  Substance Use Topics   Alcohol use: Yes    Comment: social   Drug use: Never     Allergies   Cephalexin, Cephalosporins, Prochlorperazine, Shrimp extract allergy skin test,  Bee venom, Latex, Other, and Silicone   Review of Systems Review of Systems  Respiratory:  Positive for cough and wheezing.      Physical Exam Triage Vital Signs ED Triage Vitals  Enc Vitals Group     BP 03/03/23 0855 (!) 161/75     Pulse Rate 03/03/23 0855 61     Resp 03/03/23 0855 18     Temp 03/03/23 0855 97.8 F (36.6 C)     Temp Source 03/03/23 0855 Oral     SpO2 03/03/23 0855 95 %     Weight --      Height --      Head Circumference --      Peak Flow --      Pain Score  03/03/23 0858 7     Pain Loc --      Pain Edu? --      Excl. in [place]? --    No data found.  Updated Vital Signs BP (!) 161/75 (BP Location: Left Arm)   Pulse 61   Temp 97.8 F (36.6 C) (Oral)   Resp 18   SpO2 95%   Visual Acuity Right Eye Distance:   Left Eye Distance:   Bilateral Distance:    Right Eye Near:   Left Eye Near:    Bilateral Near:     Physical Exam Vitals reviewed.  Constitutional:      Appearance: Normal appearance.  HENT:     Nose:     Right Sinus: Maxillary sinus tenderness and frontal sinus tenderness present.  Cardiovascular:     Rate and Rhythm: Normal rate and regular rhythm.     Pulses: Normal pulses.     Heart sounds: Normal heart sounds.  Pulmonary:     Effort: Pulmonary effort is normal.     Breath sounds: Normal breath sounds.  Skin:    General: Skin is warm and dry.  Neurological:     General: No focal deficit present.     Mental Status: She is alert and oriented to person, place, and time.  Psychiatric:        Mood and Affect: Mood normal.        Behavior: Behavior normal.      UC Treatments / Results  Labs (all labs ordered are listed, but only abnormal results are displayed) Labs Reviewed - No data to display  EKG   Radiology No results found.  Procedures Procedures (including critical care time)  Medications Ordered in UC Medications - No data to display  Initial Impression / Assessment and Plan / UC Course  I have reviewed  the triage vital signs and the nursing notes.  Pertinent labs & imaging results that were available during my care of the patient were reviewed by me and considered in my medical decision making (see chart for details).   Patient is afebrile here without recent antipyretics. Satting well on room air. Overall is ill appearing, well hydrated, without respiratory distress. Pulmonary exam is unremarkable.  Lungs CTAB without wheezing, rhonchi, rales.  Frontal and maxillary sinus tenderness is present on the right side.  Patient's symptoms are consistent with an acute bacterial sinusitis, likely secondary to viral URI.  Will prescribe Augmentin and asked patient to continue to use her inhaler for symptoms of wheezing.  Also prescribing benzonatate to help with cough.  Asked her to continue to use guaifenesin as mucolytic.  Endorses intolerance of cephalosporins but not to penicillins.  Final Clinical Impressions(s) / UC Diagnoses   Final diagnoses:  None   Discharge Instructions   None    ED Prescriptions   None    PDMP not reviewed this encounter.   Rose Phi, [place] 03/03/23 [place], [place], [place] 03/03/23 [phone]

## 2023-03-03 NOTE — Discharge Instructions (Signed)
Follow up here or with your primary care provider if your symptoms are worsening or not improving with treatment.          

## 2023-03-03 NOTE — ED Triage Notes (Addendum)
Pt c/o cough, wheezing and sinus pressure x 1 week.

## 2023-03-05 ENCOUNTER — Ambulatory Visit
Admission: RE | Admit: 2023-03-05 | Discharge: 2023-03-05 | Disposition: A | Discharge status: Home / Self Care | Payer: Medicare Other | Source: Ambulatory Visit | Attending: Student in an Organized Health Care Education/Training Program | Admitting: Student in an Organized Health Care Education/Training Program

## 2023-03-05 DIAGNOSIS — M47816 Spondylosis without myelopathy or radiculopathy, lumbar region: Secondary | ICD-10-CM | POA: Diagnosis present

## 2023-03-05 DIAGNOSIS — G8929 Other chronic pain: Secondary | ICD-10-CM | POA: Diagnosis present

## 2023-03-05 DIAGNOSIS — M5136 Other intervertebral disc degeneration, lumbar region: Secondary | ICD-10-CM | POA: Diagnosis present

## 2023-03-05 DIAGNOSIS — M25561 Pain in right knee: Secondary | ICD-10-CM | POA: Diagnosis present

## 2023-03-05 DIAGNOSIS — M1711 Unilateral primary osteoarthritis, right knee: Secondary | ICD-10-CM | POA: Insufficient documentation

## 2023-03-05 DIAGNOSIS — G894 Chronic pain syndrome: Secondary | ICD-10-CM

## 2023-03-07 ENCOUNTER — Encounter: Payer: Self-pay | Admitting: Student in an Organized Health Care Education/Training Program

## 2023-03-07 ENCOUNTER — Ambulatory Visit
Payer: Medicare Other | Attending: Student in an Organized Health Care Education/Training Program | Admitting: Student in an Organized Health Care Education/Training Program

## 2023-03-07 VITALS — BP 143/88 | HR 62 | Temp 97.0°F | Resp 17 | Ht 67.0 in | Wt 202.0 lb

## 2023-03-07 DIAGNOSIS — S83411A Sprain of medial collateral ligament of right knee, initial encounter: Secondary | ICD-10-CM | POA: Insufficient documentation

## 2023-03-07 DIAGNOSIS — M1711 Unilateral primary osteoarthritis, right knee: Secondary | ICD-10-CM | POA: Diagnosis not present

## 2023-03-07 DIAGNOSIS — M48061 Spinal stenosis, lumbar region without neurogenic claudication: Secondary | ICD-10-CM | POA: Diagnosis not present

## 2023-03-07 DIAGNOSIS — S83203S Other tear of unspecified meniscus, current injury, right knee, sequela: Secondary | ICD-10-CM | POA: Diagnosis present

## 2023-03-07 DIAGNOSIS — M5416 Radiculopathy, lumbar region: Secondary | ICD-10-CM | POA: Diagnosis present

## 2023-03-07 DIAGNOSIS — M25561 Pain in right knee: Secondary | ICD-10-CM | POA: Diagnosis present

## 2023-03-07 DIAGNOSIS — G8929 Other chronic pain: Secondary | ICD-10-CM | POA: Diagnosis present

## 2023-03-07 DIAGNOSIS — S83203A Other tear of unspecified meniscus, current injury, right knee, initial encounter: Secondary | ICD-10-CM | POA: Insufficient documentation

## 2023-03-07 NOTE — Progress Notes (Signed)
PROVIDER NOTE: Information contained herein reflects review and annotations entered in association with encounter. Interpretation of such information and data should be left to medically-trained personnel. Information provided to patient can be located elsewhere in the medical record under "Patient Instructions". Document created using STT-dictation technology, any transcriptional errors that may result from process are unintentional.    Patient: Sandy Byrd  Service Category: E/M  Provider: Gillis Santa, MD  DOB: 1955/03/03  DOS: 03/07/2023  Referring Provider: Gladstone Lighter, MD  MRN: [id number]  Specialty: Interventional Pain Management  PCP: Gladstone Lighter, MD  Type: Established Patient  Setting: Ambulatory outpatient    Location: Office  Delivery: Face-to-face     HPI  Sandy Byrd, a 68 y.o. year old female, is here today because of her Chronic radicular lumbar pain [M54.16, G89.29]. Sandy Byrd's primary complain today is Back Pain (lower) and Knee Pain (Right)  Pertinent problems: Sandy Byrd has Lumbar facet joint syndrome; Lumbar degenerative disc disease; Chronic pain syndrome; and H/O rotator cuff surgery (bilateral) on their pertinent problem list. Pain Assessment: Severity of Chronic pain is reported as a 8 /10. Location: Back Lower/through right hip down side and back of right leg to calf. Onset: More than a month ago. Quality: Constant, Sharp, Aching, Burning, Shooting. Timing: Constant. Modifying factor(s): meds, heat. Vitals:  height is '5\' 7"'$  (1.702 m) and weight is 202 lb (91.6 kg). Her temporal temperature is 97 F (36.1 C) (abnormal). Her blood pressure is 143/88 (abnormal) and her pulse is 62. Her respiration is 17 and oxygen saturation is 99%.  BMI: Estimated body mass index is 31.64 kg/m as calculated from the following:   Height as of this encounter: '5\' 7"'$  (1.702 m).   Weight as of this encounter: 202 lb (91.6 kg). Last encounter: 02/20/2023. Last  procedure: 01/09/2023.  Reason for encounter: follow-up evaluation.   Patient presents today to review her lumbar MRI and her right knee MRI.  She endorses right lower back pain with radiation down her right buttock to her lateral thigh, down her posterior lateral foot. She also endorses right knee pain and instability. She is currently ambulating with a cane She is also experiencing a sinus infection  Pharmacotherapy Assessment  Analgesic: Norco 5 mg daily prn #30 usually lasts 10-12 weeks   Monitoring: [place] PMP: PDMP not reviewed this encounter.       Pharmacotherapy: No side-effects or adverse reactions reported. Compliance: No problems identified. Effectiveness: Clinically acceptable.  Rise Patience, RN  03/07/2023  8:16 AM  Sign when Signing Visit Safety precautions to be maintained throughout the outpatient stay will include: orient to surroundings, keep bed in low position, maintain call bell within reach at all times, provide assistance with transfer out of bed and ambulation.     No results found for: "CBDTHCR" No results found for: "D8THCCBX" No results found for: "D9THCCBX"  UDS:  Summary  Date Value Ref Range Status  09/18/2022 Note  Final    Comment:    ==================================================================== [facility] (MW) ==================================================================== Test                             Result       Flag       Units  Drug Present and Declared for Prescription Verification   Norhydrocodone                 119  EXPECTED   ng/mg creat    Norhydrocodone is an expected metabolite of hydrocodone.  Drug Present not Declared for Prescription Verification   Oxazepam                       27           UNEXPECTED ng/mg creat    Oxazepam may be administered as a scheduled prescription medication;    it is also an expected metabolite of other benzodiazepine drugs,    including diazepam, chlordiazepoxide,  prazepam, clorazepate,    halazepam, and temazepam.  Drug Absent but Declared for Prescription Verification   Hydrocodone                    Not Detected UNEXPECTED ng/mg creat    Hydrocodone is almost always present in patients taking this drug    consistently. Absence of hydrocodone could be due to lapse of time    since the last dose or unusual pharmacokinetics (rapid metabolism).  ==================================================================== Test                      Result    Flag   Units      Ref Range   Creatinine              73               mg/dL      >=20 ==================================================================== Declared Medications:  The flagging and interpretation on this report are based on the  following declared medications.  Unexpected results may arise from  inaccuracies in the declared medications.   **Note: The testing scope of this panel includes these medications:   Hydrocodone (Norco)   **Note: The testing scope of this panel does not include the  following reported medications:   Acetaminophen (Tylenol)  Acetaminophen (Norco)  Albuterol (Ventolin HFA)  Aspirin  Celecoxib (Celebrex)  Cyanocobalamin  Diclofenac (Voltaren)  Epinephrine (EpiPen)  Escitalopram (Lexapro)  Eye Drop  Fluticasone (Flonase)  Folic Acid  Gabapentin (Neurontin)  Hydrochlorothiazide (Dyazide)  Ketoconazole (Nizoral)  Levothyroxine (Synthroid)  Methocarbamol (Robaxin)  Metronidazole (MetroGel)  Minocycline (Minocin)  Polyethylene Glycol  Simvastatin (Zocor)  Topical Lidocaine (Lidoderm)  Trazodone (Desyrel)  Triamcinolone (Kenalog)  Triamterene (Dyazide)  Vitamin D2 ==================================================================== For clinical consultation, please call [phone]. ====================================================================       ROS  Constitutional:  Sinus congestion Gastrointestinal: No reported hemesis,  hematochezia, vomiting, or acute GI distress Musculoskeletal:  Right low back pain with radiation into right leg, right knee pain Neurological: No reported episodes of acute onset apraxia, aphasia, dysarthria, agnosia, amnesia, paralysis, loss of coordination, or loss of consciousness  Medication Review  EPINEPHrine, HYDROcodone-acetaminophen, Propylene Glycol, Vitamin D (Ergocalciferol), acetaminophen, albuterol, amoxicillin-clavulanate, benzonatate, ciprofloxacin, cyanocobalamin, cycloSPORINE, diazepam, diclofenac, ergocalciferol, escitalopram, fluticasone, folic acid, gabapentin, ketoconazole, levothyroxine, lidocaine, methocarbamol, metroNIDAZOLE, minocycline, predniSONE, simvastatin, traZODone, triamcinolone cream, and triamterene-hydrochlorothiazide  History Review  Allergy: Sandy Byrd is allergic to cephalexin, cephalosporins, prochlorperazine, shrimp extract, bee venom, latex, other, and silicone. Drug: Sandy Byrd  reports no history of drug use. Alcohol:  reports current alcohol use. Tobacco:  reports that she quit smoking about 21 years ago. Her smoking use included cigarettes. She has never been exposed to tobacco smoke. She has never used smokeless tobacco. Social: Sandy Byrd  reports that she quit smoking about 21 years ago. Her smoking use included cigarettes. She has never been exposed to tobacco smoke. She has never used smokeless tobacco.  She reports current alcohol use. She reports that she does not use drugs. Medical:  has a past medical history of Anxiety, Arthritis, Depression, Hypertension, Rosacea, and Thyroid disease. Surgical: Sandy Byrd  has a past surgical history that includes Shoulder surgery (Right, 2016) and Shoulder surgery (Right, 2017). Family: family history is not on file.  Laboratory Chemistry Profile   Renal No results found for: "BUN", "CREATININE", "LABCREA", "BCR", "GFR", "GFRAA", "GFRNONAA", "LABVMA", "EPIRU", "EPINEPH24HUR", "NOREPRU",  "NOREPI24HUR", "DOPARU", "DOPAM24HRUR"  Hepatic No results found for: "AST", "ALT", "ALBUMIN", "ALKPHOS", "HCVAB", "AMYLASE", "LIPASE", "AMMONIA"  Electrolytes No results found for: "NA", "K", "CL", "CALCIUM", "MG", "PHOS"  Bone No results found for: "VD25OH", "VD125OH2TOT", "[id number]", "[id number]", "25OHVITD1", "25OHVITD2", "25OHVITD3", "TESTOFREE", "TESTOSTERONE"  Inflammation (CRP: Acute Phase) (ESR: Chronic Phase) No results found for: "CRP", "ESRSEDRATE", "LATICACIDVEN"       Note: Above Lab results reviewed.  Recent Imaging Review  MR LUMBAR SPINE WO CONTRAST CLINICAL DATA:  Low back pain  EXAM: MRI LUMBAR SPINE WITHOUT CONTRAST  TECHNIQUE: Multiplanar, multisequence MR imaging of the lumbar spine was performed. No intravenous contrast was administered.  COMPARISON:  None Available.  FINDINGS: Segmentation:  Standard.  Alignment: Straightening of the normal lumbar lordosis. There is grade 1 anterolisthesis of L3 on L4.  Vertebrae:  No fracture, evidence of discitis, or bone lesion.  Conus medullaris and cauda equina: Conus extends to the L1-L2 disc space level. Conus and cauda equina appear normal.  Paraspinal and other soft tissues: Negative.  Disc levels:  T12-L1: No significant disc bulge. No facet degenerative change. No spinal canal stenosis. No neural foraminal stenosis.  L1-L2: No significant disc bulge. Mild bilateral facet degenerative change. No significant spinal canal stenosis. No neural foraminal stenosis.  L2-L3: No significant disc bulge. Mild bilateral facet degenerative change. Ligamentum flavum hypertrophy. Mild spinal canal narrowing. No neural foraminal narrowing.  L3-L4: Circumferential disc bulge. Mild spinal canal narrowing. Moderate bilateral facet degenerative change with an intraspinal synovial cyst on the left. The synovial cyst measures up to 4 x 2 mm and abuts the descending nerve roots along the posterior margin. There is no  evidence of neural foraminal stenosis. Mild narrowing of the left lateral recess.  L4-L5: Eccentric right disc bulge. Mild bilateral facet degenerative change. Mild spinal canal narrowing. Mild bilateral neural foraminal narrowing. There is moderate narrowing of the right lateral recess.  L5-S1: Moderate bilateral facet degenerative change. Circumferential disc bulge. No significant spinal canal narrowing. Mild bilateral neural foraminal narrowing.  IMPRESSION: 1. No evidence of high-grade spinal canal or neural foraminal stenosis. 2. Moderate narrowing of the right lateral recess at L4-L5, which could impinge the traversing right L5 nerve root. 3. Small intraspinal synovial cyst on the left at L3-L4 which abuts the descending nerve roots along the posterior margin.  Electronically Signed   By: Marin Roberts M.D.   On: 03/05/2023 15:21 MR KNEE RIGHT WO CONTRAST CLINICAL DATA:  Knee pain, chronic, erosive osteoarthritis suspected, xray done Knee pain, chronic, degenerative disease on xray (Age >= 5y)  EXAM: MRI OF THE RIGHT KNEE WITHOUT CONTRAST  TECHNIQUE: Multiplanar, multisequence MR imaging of the knee was performed. No intravenous contrast was administered.  COMPARISON:  Right knee radiograph 06/26/2022  FINDINGS: MENISCI  Medial: There is degenerative tearing of the medial meniscal body with probable extension into the anterior posterior horns.  Lateral: Free edge fraying of the lateral meniscal body.  LIGAMENTS  Cruciates: ACL and PCL are intact.  Collaterals: Mild Periligamentous edema along the medial collateral ligament,  which is otherwise intact. Lateral collateral ligament complex is intact.  CARTILAGE  Patellofemoral: Low-grade chondrosis of the lateral patellar facet and inferior medial trochlea.  Medial: Areas of intermediate high-grade cartilage loss along the weight-bearing surfaces.  Lateral:  Low to intermediate grade chondrosis.  JOINT:  Small joint effusion.  POPLITEAL FOSSA: Small Baker's cyst.  EXTENSOR MECHANISM: Intact quadriceps tendon. Intact patellar tendon.  BONES: Degenerative changes at the proximal tibiofibular joint. No evidence of acute fracture. No aggressive osseous lesion.  Other: No focal fluid collection.  IMPRESSION: Degenerative tearing of the medial meniscal body with probable extension into the anterior and posterior horns.  Free edge fraying of the lateral meniscal body.  Tricompartment osteoarthritis, worst in the medial compartment with areas of intermediate to high-grade cartilage loss along the weight-bearing surfaces.  Mild periligamentous edema along the MCL, suggesting grade 1 MCL sprain.  Small joint effusion. Small Baker's cyst.  Electronically Signed   By: Maurine Simmering M.D.   On: 03/05/2023 15:30 Note: Reviewed        Physical Exam  General appearance: Well nourished, well developed, and well hydrated. In no apparent acute distress Mental status: Alert, oriented x 3 (person, place, & time)       Respiratory: No evidence of acute respiratory distress Eyes: PERLA Vitals: BP (!) 143/88   Pulse 62   Temp (!) 97 F (36.1 C) (Temporal)   Resp 17   Ht '5\' 7"'$  (1.702 m)   Wt 202 lb (91.6 kg)   SpO2 99%   BMI 31.64 kg/m  BMI: Estimated body mass index is 31.64 kg/m as calculated from the following:   Height as of this encounter: '5\' 7"'$  (1.702 m).   Weight as of this encounter: 202 lb (91.6 kg). Ideal: Ideal body weight: 61.6 kg (135 lb 12.9 oz) Adjusted ideal body weight: 73.6 kg (162 lb 4.5 oz)  Lumbar Spine Area Exam  Skin & Axial Inspection: No masses, redness, or swelling Alignment: Symmetrical Functional ROM: Pain restricted ROM affecting primarily the right Stability: No instability detected Muscle Tone/Strength: Functionally intact. No obvious neuro-muscular anomalies detected. Sensory (Neurological): Dermatomal pain pattern right L5 Palpation: No palpable  anomalies       Provocative Tests:  Lumbar quadrant test (Kemp's test): (+) on the right for foraminal stenosis Lateral bending test: (+) ipsilateral radicular pain, on the right. Positive for right-sided foraminal stenosis.  Gait & Posture Assessment  Ambulation: Patient ambulates using a cane Gait: Relatively normal for age and body habitus Posture: Difficulty standing up straight, due to pain   Right knee pain, arthropathic pain pattern, right knee instability  Assessment   Diagnosis   1. Chronic radicular lumbar pain   2. Lumbar radiculopathy   3. Neuroforaminal stenosis of lumbar spine   4. Chronic pain of right knee   5. Primary osteoarthritis of right knee   6. Complex tear of meniscus of right knee, unspecified meniscus, unspecified whether old or current tear, sequela   7. Sprain of medial collateral ligament of right knee, initial encounter       Updated Problems: Problem  Lumbar Radiculopathy  Neuroforaminal Stenosis of Lumbar Spine  Complex Tear of Meniscus of Right Knee  Sprain of Medial Collateral Ligament of Right Knee    Plan of Care  Discussed lumbar MRI with patient in great detail.  She has tried physical therapy, is currently on gabapentin and hydrocodone with limited response.  She is having difficulty ambulating.  She has an L4-L5 disc herniation that  is compressing the right L5 nerve.  We discussed a transforaminal epidural steroid injection.  She has previously had a intra-articular knee steroid injection with me done July 2023 that provided her pain relief.  We discussed repeating.  Future considerations would include genicular nerve block, possible RFA and referral to orthopedics.  Orders:  Orders Placed This Encounter  Procedures   Lumbar Transforaminal Epidural    Standing Status:   Future    Standing Expiration Date:   06/07/2023    Scheduling Instructions:     Laterality: Right L4 and L5       Sedation: PO Valium     Timeframe: ASAP    Order  Specific Question:   Where will this procedure be performed?    Answer:   [facility]   KNEE INJECTION    Local Anesthetic & Steroid injection.    Standing Status:   Future    Standing Expiration Date:   06/07/2023    Scheduling Instructions:     Side: RIGHT knee steroid     Timeframe: As soon as schedule allows    Order Specific Question:   Where will this procedure be performed?    Answer:   [facility]   Follow-up plan:   Return in about 1 week (around 03/14/2023) for Right L5 transforaminal ESI, right intra-articular knee steroid injection with p.o. Valium.      Recent Visits Date Type Provider Dept  02/20/23 Office Visit Gillis Santa, MD [facility]  01/09/23 Procedure visit Gillis Santa, MD [facility]  12/27/22 Office Visit Gillis Santa, MD [facility]  Showing recent visits within past 90 days and meeting all other requirements Today's Visits Date Type Provider Dept  03/07/23 Office Visit Gillis Santa, MD [facility]  Showing today's visits and meeting all other requirements Future Appointments Date Type Provider Dept  04/03/23 Appointment Gillis Santa, MD [facility]  Showing future appointments within next 90 days and meeting all other requirements  I discussed the assessment and treatment plan with the patient. The patient was provided an opportunity to ask questions and all were answered. The patient agreed with the plan and demonstrated an understanding of the instructions.  Patient advised to call back or seek an in-person evaluation if the symptoms or condition worsens.  Duration of encounter: 3mnutes.  Total time on encounter, as per AMA guidelines included both the face-to-face and non-face-to-face time personally spent by the physician and/or other qualified health care professional(s) on the day of the encounter (includes time in activities that require the physician or other qualified  health care professional and does not include time in activities normally performed by clinical staff). Physician's time may include the following activities when performed: Preparing to see the patient (e.g., pre-charting review of records, searching for previously ordered imaging, lab work, and nerve conduction tests) Review of prior analgesic pharmacotherapies. Reviewing PMP Interpreting ordered tests (e.g., lab work, imaging, nerve conduction tests) Performing post-procedure evaluations, including interpretation of diagnostic procedures Obtaining and/or reviewing separately obtained history Performing a medically appropriate examination and/or evaluation Counseling and educating the patient/family/caregiver Ordering medications, tests, or procedures Referring and communicating with other health care professionals (when not separately reported) Documenting clinical information in the electronic or other health record Independently interpreting results (not separately reported) and communicating results to the patient/ family/caregiver Care coordination (not separately reported)  Note by: BGillis Santa MD Date: 03/07/2023; Time: 9:01 AM

## 2023-03-07 NOTE — Progress Notes (Signed)
Safety precautions to be maintained throughout the outpatient stay will include: orient to surroundings, keep bed in low position, maintain call bell within reach at all times, provide assistance with transfer out of bed and ambulation.  

## 2023-04-02 ENCOUNTER — Encounter: Payer: Medicare Other | Admitting: Student in an Organized Health Care Education/Training Program

## 2023-04-03 ENCOUNTER — Ambulatory Visit
Payer: Medicare Other | Attending: Student in an Organized Health Care Education/Training Program | Admitting: Student in an Organized Health Care Education/Training Program

## 2023-04-03 ENCOUNTER — Ambulatory Visit
Admission: RE | Admit: 2023-04-03 | Discharge: 2023-04-03 | Disposition: A | Discharge status: Home / Self Care | Payer: Medicare Other | Source: Ambulatory Visit | Attending: Student in an Organized Health Care Education/Training Program | Admitting: Student in an Organized Health Care Education/Training Program

## 2023-04-03 ENCOUNTER — Encounter: Payer: Self-pay | Admitting: Student in an Organized Health Care Education/Training Program

## 2023-04-03 DIAGNOSIS — G8929 Other chronic pain: Secondary | ICD-10-CM | POA: Diagnosis present

## 2023-04-03 DIAGNOSIS — M5416 Radiculopathy, lumbar region: Secondary | ICD-10-CM | POA: Diagnosis not present

## 2023-04-03 DIAGNOSIS — M48061 Spinal stenosis, lumbar region without neurogenic claudication: Secondary | ICD-10-CM | POA: Insufficient documentation

## 2023-04-03 MED ORDER — DIAZEPAM 5 MG PO TABS
5.0000 mg | ORAL_TABLET | ORAL | Status: AC
  Administered 2023-04-03: 5 mg via ORAL

## 2023-04-03 MED ORDER — SODIUM CHLORIDE 0.9% FLUSH
2.0000 mL | Freq: Once | INTRAVENOUS | Status: AC
  Administered 2023-04-03: 2 mL

## 2023-04-03 MED ORDER — LIDOCAINE HCL 2 % IJ SOLN
20.0000 mL | Freq: Once | INTRAMUSCULAR | Status: AC
  Administered 2023-04-03: 400 mg
  Filled 2023-04-03: qty 20

## 2023-04-03 MED ORDER — DIAZEPAM 5 MG PO TABS
ORAL_TABLET | ORAL | Status: AC
  Filled 2023-04-03: qty 1

## 2023-04-03 MED ORDER — HYDROCODONE-ACETAMINOPHEN 7.5-325 MG PO TABS: 1.0000 | ORAL_TABLET | Freq: Two times a day (BID) | ORAL | 0 refills | Status: DC

## 2023-04-03 MED ORDER — DEXAMETHASONE SODIUM PHOSPHATE 10 MG/ML IJ SOLN
20.0000 mg | Freq: Once | INTRAMUSCULAR | Status: AC
  Administered 2023-04-03: 20 mg
  Filled 2023-04-03: qty 2

## 2023-04-03 MED ORDER — ROPIVACAINE HCL 2 MG/ML IJ SOLN
2.0000 mL | Freq: Once | INTRAMUSCULAR | Status: AC
  Administered 2023-04-03: 2 mL via EPIDURAL
  Filled 2023-04-03: qty 20

## 2023-04-03 MED ORDER — IOHEXOL 180 MG/ML  SOLN
10.0000 mL | Freq: Once | INTRAMUSCULAR | Status: AC
  Administered 2023-04-03: 10 mL via EPIDURAL
  Filled 2023-04-03: qty 20

## 2023-04-03 NOTE — Progress Notes (Signed)
Nursing Pain Medication Assessment:  Safety precautions to be maintained throughout the outpatient stay will include: orient to surroundings, keep bed in low position, maintain call bell within reach at all times, provide assistance with transfer out of bed and ambulation.  Medication Inspection Compliance: Pill count conducted under aseptic conditions, in front of the patient. Neither the pills nor the bottle was removed from the patient's sight at any time. Once count was completed pills were immediately returned to the patient in their original bottle.  Medication: Oxycodone/APAP Pill/Patch Count:  18 of 60 pills remain Pill/Patch Appearance: Markings consistent with prescribed medication Bottle Appearance: Standard pharmacy container. Clearly labeled. Filled Date: 03 / 19 / 2024 Last Medication intake:  TodaySafety precautions to be maintained throughout the outpatient stay will include: orient to surroundings, keep bed in low position, maintain call bell within reach at all times, provide assistance with transfer out of bed and ambulation.

## 2023-04-03 NOTE — Progress Notes (Signed)
PROVIDER NOTE: Interpretation of information contained herein should be left to medically-trained personnel. Specific patient instructions are provided elsewhere under "Patient Instructions" section of medical record. This document was created in part using STT-dictation technology, any transcriptional errors that may result from this process are unintentional.  Patient: Sandy Byrd Type: Established DOB: 12-09-1955 MRN: [id number] PCP: Gladstone Lighter, MD  Service: Procedure DOS: 04/03/2023 Setting: Ambulatory Location: Ambulatory outpatient facility Delivery: Face-to-face Provider: Gillis Santa, MD Specialty: Interventional Pain Management Specialty designation: 09 Location: Outpatient facility Ref. Prov.: Gillis Santa, MD       Interventional Therapy   Procedure: Lumbar trans-foraminal epidural steroid injection (L-TFESI) #1  Laterality: Right (-RT)  Level: L4 & L5  nerve root(s) Imaging: Fluoroscopy-guided         Anesthesia: Local anesthesia (1-2% Lidocaine) Anxiolysis:PO Valium 5 mg DOS: 04/03/2023  Performed by: Gillis Santa, MD  Purpose: Diagnostic/Therapeutic Indications: Lumbar radicular pain severe enough to impact quality of life or function. 1. Chronic radicular lumbar pain   2. Lumbar radiculopathy   3. Neuroforaminal stenosis of lumbar spine    NAS-11 Pain score:   Pre-procedure: 9 /10   Post-procedure: 7 /10     Position / Prep / Materials:  Position: Prone  Prep solution: DuraPrep (Iodine Povacrylex [0.7% available iodine] and Isopropyl Alcohol, 74% w/w) Prep Area: Entire Posterior Lumbosacral Area.  From the lower tip of the scapula down to the tailbone and from flank to flank. Materials:  Tray: Block Needle(s):  Type: Spinal  Gauge (G): 22  Length: 5-in  Qty: 2      Pre-op H&P Assessment:  Sandy Byrd is a 68 y.o. (year old), female patient, seen today for interventional treatment. She  has a past surgical history that includes Shoulder  surgery (Right, 2016) and Shoulder surgery (Right, 2017). Sandy Byrd has a current medication list which includes the following prescription(s): acetaminophen, celecoxib, cyclosporine, epinephrine, ergocalciferol, escitalopram, fluticasone, folic acid, gabapentin, ketoconazole, levothyroxine, lidocaine, lidocaine, methocarbamol, metronidazole, minocycline, prednisone, propylene glycol, simvastatin, trazodone, triamcinolone cream, triamterene-hydrochlorothiazide, ventolin hfa, vitamin d (ergocalciferol), amoxicillin-clavulanate, benzonatate, ciprofloxacin, cyanocobalamin, diazepam, diclofenac, [START ON 05/09/2023] hydrocodone-acetaminophen, and [START ON 06/08/2023] hydrocodone-acetaminophen. Her primarily concern today is the Back Pain (Lower worse on right)  Initial Vital Signs:  Pulse/HCG Rate: (!) 59  Temp: 98.2 F (36.8 C) Resp: 16 BP: (!) 140/70 SpO2: 97 %  BMI: Estimated body mass index is 32.89 kg/m as calculated from the following:   Height as of this encounter: 5\' 7"  (1.702 m).   Weight as of this encounter: 210 lb (95.3 kg).  Risk Assessment: Allergies: Reviewed. She is allergic to cephalexin, cephalosporins, prochlorperazine, shrimp extract, bee venom, latex, other, and silicone.  Allergy Precautions: None required Coagulopathies: Reviewed. None identified.  Blood-thinner therapy: None at this time Active Infection(s): Reviewed. None identified. Sandy Byrd is afebrile  Site Confirmation: Sandy Byrd was asked to confirm the procedure and laterality before marking the site Procedure checklist: Completed Consent: Before the procedure and under the influence of no sedative(s), amnesic(s), or anxiolytics, the patient was informed of the treatment options, risks and possible complications. To fulfill our ethical and legal obligations, as recommended by the American Medical Association's Code of Ethics, I have informed the patient of my clinical impression; the nature and purpose  of the treatment or procedure; the risks, benefits, and possible complications of the intervention; the alternatives, including doing nothing; the risk(s) and benefit(s) of the alternative treatment(s) or procedure(s); and the risk(s) and benefit(s) of doing nothing. The patient was  provided information about the general risks and possible complications associated with the procedure. These may include, but are not limited to: failure to achieve desired goals, infection, bleeding, organ or nerve damage, allergic reactions, paralysis, and death. In addition, the patient was informed of those risks and complications associated to Spine-related procedures, such as failure to decrease pain; infection (i.e.: Meningitis, epidural or intraspinal abscess); bleeding (i.e.: epidural hematoma, subarachnoid hemorrhage, or any other type of intraspinal or peri-dural bleeding); organ or nerve damage (i.e.: Any type of peripheral nerve, nerve root, or spinal cord injury) with subsequent damage to sensory, motor, and/or autonomic systems, resulting in permanent pain, numbness, and/or weakness of one or several areas of the body; allergic reactions; (i.e.: anaphylactic reaction); and/or death. Furthermore, the patient was informed of those risks and complications associated with the medications. These include, but are not limited to: allergic reactions (i.e.: anaphylactic or anaphylactoid reaction(s)); adrenal axis suppression; blood sugar elevation that in diabetics may result in ketoacidosis or comma; water retention that in patients with history of congestive heart failure may result in shortness of breath, pulmonary edema, and decompensation with resultant heart failure; weight gain; swelling or edema; medication-induced neural toxicity; particulate matter embolism and blood vessel occlusion with resultant organ, and/or nervous system infarction; and/or aseptic necrosis of one or more joints. Finally, the patient was informed  that Medicine is not an exact science; therefore, there is also the possibility of unforeseen or unpredictable risks and/or possible complications that may result in a catastrophic outcome. The patient indicated having understood very clearly. We have given the patient no guarantees and we have made no promises. Enough time was given to the patient to ask questions, all of which were answered to the patient's satisfaction. Ms. Trivette has indicated that she wanted to continue with the procedure. Attestation: I, the ordering provider, attest that I have discussed with the patient the benefits, risks, side-effects, alternatives, likelihood of achieving goals, and potential problems during recovery for the procedure that I have provided informed consent. Date  Time: 04/03/2023 11:05 AM   Pre-Procedure Preparation:  Monitoring: As per clinic protocol. Respiration, ETCO2, SpO2, BP, heart rate and rhythm monitor placed and checked for adequate function Safety Precautions: Patient was assessed for positional comfort and pressure points before starting the procedure. Time-out: I initiated and conducted the "Time-out" before starting the procedure, as per protocol. The patient was asked to participate by confirming the accuracy of the "Time Out" information. Verification of the correct person, site, and procedure were performed and confirmed by me, the nursing staff, and the patient. "Time-out" conducted as per Joint Commission's Universal Protocol (UP.01.01.01). Time: [id number] Start Time: 1138 hrs.  Description/Narrative of Procedure:          Target: The 6 o'clock position under the pedicle, on the affected side. Region: Posterolateral Lumbosacral Approach: Posterior Percutaneous Paravertebral approach.  Rationale (medical necessity): procedure needed and proper for the diagnosis and/or treatment of the patient's medical symptoms and needs. Procedural Technique Safety Precautions: Aspiration looking for  blood return was conducted prior to all injections. At no point did we inject any substances, as a needle was being advanced. No attempts were made at seeking any paresthesias. Safe injection practices and needle disposal techniques used. Medications properly checked for expiration dates. SDV (single dose vial) medications used. Description of the Procedure: Protocol guidelines were followed. The patient was placed in position over the procedure table. The target area was identified and the area prepped in the usual manner. Skin &  deeper tissues infiltrated with local anesthetic. Appropriate amount of time allowed to pass for local anesthetics to take effect. The procedure needles were then advanced to the target area. Proper needle placement secured. Negative aspiration confirmed. Solution injected in intermittent fashion, asking for systemic symptoms every 0.5cc of injectate. The needles were then removed and the area cleansed, making sure to leave some of the prepping solution back to take advantage of its long term bactericidal properties.  Vitals:   04/03/23 1135 04/03/23 1140 04/03/23 1144 04/03/23 1147  BP: 133/79 127/81 137/80 (!) 141/81  Pulse: (!) 57 (!) 56 (!) 57 (!) 59  Resp: 16 15 16 16   Temp:      TempSrc:      SpO2: 99% 99% 100% 99%  Weight:      Height:        Start Time: 1138 hrs. End Time: 1145 hrs.  Imaging Guidance (Spinal):          Type of Imaging Technique: Fluoroscopy Guidance (Spinal) Indication(s): Assistance in needle guidance and placement for procedures requiring needle placement in or near specific anatomical locations not easily accessible without such assistance. Exposure Time: Please see nurses notes. Contrast: Before injecting any contrast, we confirmed that the patient did not have an allergy to iodine, shellfish, or radiological contrast. Once satisfactory needle placement was completed at the desired level, radiological contrast was injected. Contrast  injected under live fluoroscopy. No contrast complications. See chart for type and volume of contrast used. Fluoroscopic Guidance: I was personally present during the use of fluoroscopy. "Tunnel Vision Technique" used to obtain the best possible view of the target area. Parallax error corrected before commencing the procedure. "Direction-depth-direction" technique used to introduce the needle under continuous pulsed fluoroscopy. Once target was reached, antero-posterior, oblique, and lateral fluoroscopic projection used confirm needle placement in all planes. Images permanently stored in EMR. Interpretation: I personally interpreted the imaging intraoperatively. Adequate needle placement confirmed in multiple planes. Appropriate spread of contrast into desired area was observed. No evidence of afferent or efferent intravascular uptake. No intrathecal or subarachnoid spread observed. Permanent images saved into the patient's record.  Post-operative Assessment:  Post-procedure Vital Signs:  Pulse/HCG Rate: (!) 59  Temp: 98.2 F (36.8 C) Resp: 16 BP: (!) 141/81 SpO2: 99 %  EBL: None  Complications: No immediate post-treatment complications observed by team, or reported by patient.  Note: The patient tolerated the entire procedure well. A repeat set of vitals were taken after the procedure and the patient was kept under observation following institutional policy, for this type of procedure. Post-procedural neurological assessment was performed, showing return to baseline, prior to discharge. The patient was provided with post-procedure discharge instructions, including a section on how to identify potential problems. Should any problems arise concerning this procedure, the patient was given instructions to immediately contact us, at any time, without hesitation. In any case, we plan to contact the patient by telephone for a follow-up status report regarding this interventional procedure.  Comments:   No additional relevant information.  Plan of Care (POC)  Orders:  Orders Placed This Encounter  Procedures   DG PAIN [facility] C-ARM 1-60 MIN NO REPORT    Intraoperative interpretation by procedural physician at [place].    Standing Status:   Standing    Number of Occurrences:   1    Order Specific Question:   Reason for exam:    Answer:   Assistance in needle guidance and placement for procedures requiring needle placement  in or near specific anatomical locations not easily accessible without such assistance.   Chronic Opioid Analgesic:  Norco 5 mg daily prn #30 usually lasts 10-12 weeks    Pharmacotherapy Assessment  Analgesic: Norco 7.5 mg BID prn #60   Monitoring: [place] PMP: PDMP reviewed during this encounter.       Pharmacotherapy: No side-effects or adverse reactions reported. Compliance: No problems identified. Effectiveness: Clinically acceptable.  UDS:  Summary  Date Value Ref Range Status  09/18/2022 Note  Final    Comment:    ==================================================================== [employer] Select 13 (MW) ==================================================================== Test                             Result       Flag       Units  Drug Present and Declared for Prescription Verification   Norhydrocodone                 119          EXPECTED   ng/mg creat    Norhydrocodone is an expected metabolite of hydrocodone.  Drug Present not Declared for Prescription Verification   Oxazepam                       27           UNEXPECTED ng/mg creat    Oxazepam may be administered as a scheduled prescription medication;    it is also an expected metabolite of other benzodiazepine drugs,    including diazepam, chlordiazepoxide, prazepam, clorazepate,    halazepam, and temazepam.  Drug Absent but Declared for Prescription Verification   Hydrocodone                    Not Detected UNEXPECTED ng/mg creat    Hydrocodone is almost always present in  patients taking this drug    consistently. Absence of hydrocodone could be due to lapse of time    since the last dose or unusual pharmacokinetics (rapid metabolism).  ==================================================================== Test                      Result    Flag   Units      Ref Range   Creatinine              73               mg/dL      >=20 ==================================================================== Declared Medications:  The flagging and interpretation on this report are based on the  following declared medications.  Unexpected results may arise from  inaccuracies in the declared medications.   **Note: The testing scope of this panel includes these medications:   Hydrocodone (Norco)   **Note: The testing scope of this panel does not include the  following reported medications:   Acetaminophen (Tylenol)  Acetaminophen (Norco)  Albuterol (Ventolin HFA)  Aspirin  Celecoxib (Celebrex)  Cyanocobalamin  Diclofenac (Voltaren)  Epinephrine (EpiPen)  Escitalopram (Lexapro)  Eye Drop  Fluticasone (Flonase)  Folic Acid  Gabapentin (Neurontin)  Hydrochlorothiazide (Dyazide)  Ketoconazole (Nizoral)  Levothyroxine (Synthroid)  Methocarbamol (Robaxin)  Metronidazole (MetroGel)  Minocycline (Minocin)  Polyethylene Glycol  Simvastatin (Zocor)  Topical Lidocaine (Lidoderm)  Trazodone (Desyrel)  Triamcinolone (Kenalog)  Triamterene (Dyazide)  Vitamin D2 ==================================================================== For clinical consultation, please call [phone]. ====================================================================       Medications ordered for procedure: Meds ordered this encounter  Medications   iohexol (OMNIPAQUE) 180 MG/ML injection 10 mL    Must be Myelogram-compatible. If not available, you may substitute with a water-soluble, non-ionic, hypoallergenic, myelogram-compatible radiological contrast medium.   lidocaine  (XYLOCAINE) 2 % (with pres) injection 400 mg   diazepam (VALIUM) tablet 5 mg    Make sure Flumazenil is available in the pyxis when using this medication. If oversedation occurs, administer 0.2 mg IV over 15 sec. If after 45 sec no response, administer 0.2 mg again over 1 min; may repeat at 1 min intervals; not to exceed 4 doses (1 mg)   sodium chloride flush (NS) 0.9 % injection 2 mL    This is for a two (2) level block. Use two (2) syringes and divide content in half.   ropivacaine (PF) 2 mg/mL (0.2%) (NAROPIN) injection 2 mL    This is for a two (2) level block. Use two (2) syringes and divide content in half.   dexamethasone (DECADRON) injection 20 mg    This is for a two (2) level block. Use two (2) syringes and divide content in half.   HYDROcodone-acetaminophen (NORCO) 7.5-325 MG tablet    Sig: Take 1 tablet by mouth every 12 (twelve) hours. Must last 30 days.    Dispense:  60 tablet    Refill:  0    Chronic Pain: STOP Act (Not applicable) Fill 1 day early if closed on refill date. Avoid benzodiazepines within 8 hours of opioids   HYDROcodone-acetaminophen (NORCO) 7.5-325 MG tablet    Sig: Take 1 tablet by mouth every 12 (twelve) hours. Must last 30 days.    Dispense:  60 tablet    Refill:  0    Chronic Pain: STOP Act (Not applicable) Fill 1 day early if closed on refill date. Avoid benzodiazepines within 8 hours of opioids   Medications administered: We administered iohexol, lidocaine, diazepam, sodium chloride flush, ropivacaine (PF) 2 mg/mL (0.2%), and dexamethasone.  See the medical record for exact dosing, route, and time of administration.  Follow-up plan:   Return in about 3 months (around 07/09/2023) for Medication Management, in person.      Recent Visits Date Type Provider Dept  03/07/23 Office Visit Gillis Santa, MD [facility]  02/20/23 Office Visit Gillis Santa, MD [facility]  01/09/23 Procedure visit Gillis Santa, MD [facility]   Showing recent visits within past 90 days and meeting all other requirements Today's Visits Date Type Provider Dept  04/03/23 Procedure visit Gillis Santa, MD [facility]  Showing today's visits and meeting all other requirements Future Appointments Date Type Provider Dept  05/01/23 Appointment Gillis Santa, MD [facility]  07/02/23 Appointment Gillis Santa, MD [facility]  Showing future appointments within next 90 days and meeting all other requirements  Disposition: Discharge home  Discharge (Date  Time): 04/03/2023; 1149 hrs.   Primary Care Physician: Gladstone Lighter, MD Location: [facility] Note by: Gillis Santa, MD (TTS technology used. I apologize for any typographical errors that were not detected and corrected.) Date: 04/03/2023; Time: 12:15 PM  Disclaimer:  Medicine is not an Chief Strategy Officer. The only guarantee in medicine is that nothing is guaranteed. It is important to note that the decision to proceed with this intervention was based on the information collected from the patient. The Data and conclusions were drawn from the patient's questionnaire, the interview, and the physical examination. Because the information was provided in large part by the patient, it  cannot be guaranteed that it has not been purposely or unconsciously manipulated. Every effort has been made to obtain as much relevant data as possible for this evaluation. It is important to note that the conclusions that lead to this procedure are derived in large part from the available data. Always take into account that the treatment will also be dependent on availability of resources and existing treatment guidelines, considered by other Pain Management Practitioners as being common knowledge and practice, at the time of the intervention. For Medico-Legal purposes, it is also important to point out that variation in procedural techniques and pharmacological  choices are the acceptable norm. The indications, contraindications, technique, and results of the above procedure should only be interpreted and judged by a Board-Certified Interventional Pain Specialist with extensive familiarity and expertise in the same exact procedure and technique.

## 2023-04-04 ENCOUNTER — Encounter: Payer: Medicare Other | Admitting: Student in an Organized Health Care Education/Training Program

## 2023-04-04 ENCOUNTER — Telehealth: Payer: Self-pay

## 2023-04-04 NOTE — Telephone Encounter (Signed)
Post procedure follow up.  LM 

## 2023-04-09 ENCOUNTER — Telehealth: Payer: Self-pay | Admitting: Student in an Organized Health Care Education/Training Program

## 2023-04-09 MED ORDER — HYDROCODONE-ACETAMINOPHEN 7.5-325 MG PO TABS: 1.0000 | ORAL_TABLET | Freq: Two times a day (BID) | ORAL | 0 refills | Status: AC

## 2023-04-09 NOTE — Addendum Note (Signed)
Addended by: Edward Jolly on: 04/09/2023 04:04 PM   Modules accepted: Orders

## 2023-04-09 NOTE — Telephone Encounter (Signed)
PT stated that [facility] stated they didn't have her prescription for hydrocodone for April , PT stated that they have prescription for may. Please give patient a call. TY

## 2023-04-10 ENCOUNTER — Telehealth: Payer: Self-pay | Admitting: Student in an Organized Health Care Education/Training Program

## 2023-04-10 NOTE — Telephone Encounter (Signed)
Called and talked with patient. She explained that she has been cutting a 5mg  pill in half to make 49my. MD aware. Called pharm to confirm to fill prescription.

## 2023-04-10 NOTE — Telephone Encounter (Signed)
Called pharmacy. No answer. Left voicemail for them to return my call.

## 2023-04-10 NOTE — Telephone Encounter (Signed)
PT stated that she is suppose to be getting prescription filled on today and was told by pharmacy that she doesn't have a prescription to be pick up.Please give patient a call. TY

## 2023-04-10 NOTE — Telephone Encounter (Signed)
Pharmacy has a question about patient prescription for hydrocodone. Pharmacy stated that patient was getting 5/325mg . Pharmacy wants to know what dosage does patient suppose to be getting. PT stated that she suppose to be getting hydrocodone 7.5/325 and also pharmacy wants to know if the prescription can be fill early. Please give pharmacy a call. TY

## 2023-05-01 ENCOUNTER — Ambulatory Visit
Payer: Medicare Other | Attending: Student in an Organized Health Care Education/Training Program | Admitting: Student in an Organized Health Care Education/Training Program

## 2023-05-01 VITALS — BP 127/76 | HR 70 | Temp 98.1°F | Resp 15 | Ht 67.0 in | Wt 213.0 lb

## 2023-05-01 DIAGNOSIS — M1711 Unilateral primary osteoarthritis, right knee: Secondary | ICD-10-CM | POA: Diagnosis present

## 2023-05-01 MED ORDER — ROPIVACAINE HCL 2 MG/ML IJ SOLN
4.0000 mL | Freq: Once | INTRAMUSCULAR | Status: AC
  Administered 2023-05-01: 4 mL via PERINEURAL

## 2023-05-01 MED ORDER — ROPIVACAINE HCL 2 MG/ML IJ SOLN
INTRAMUSCULAR | Status: AC
  Filled 2023-05-01: qty 20

## 2023-05-01 MED ORDER — METHYLPREDNISOLONE ACETATE 40 MG/ML IJ SUSP
INTRAMUSCULAR | Status: AC
  Filled 2023-05-01: qty 1

## 2023-05-01 MED ORDER — METHYLPREDNISOLONE ACETATE 40 MG/ML IJ SUSP
40.0000 mg | Freq: Once | INTRAMUSCULAR | Status: AC
  Administered 2023-05-01: 40 mg via INTRA_ARTICULAR

## 2023-05-01 MED ORDER — LIDOCAINE HCL (PF) 2 % IJ SOLN
5.0000 mL | Freq: Once | INTRAMUSCULAR | Status: AC
  Administered 2023-05-01: 5 mL

## 2023-05-01 MED ORDER — LIDOCAINE HCL (PF) 2 % IJ SOLN
INTRAMUSCULAR | Status: AC
  Filled 2023-05-01: qty 5

## 2023-05-01 NOTE — Progress Notes (Signed)
PROVIDER NOTE: Interpretation of information contained herein should be left to medically-trained personnel. Specific patient instructions are provided elsewhere under "Patient Instructions" section of medical record. This document was created in part using STT-dictation technology, any transcriptional errors that may result from this process are unintentional.  Patient: Sandy Byrd Type: Established DOB: 1955/04/23 MRN: [id number] PCP: Enid Baas, MD  Service: Procedure DOS: 05/01/2023 Setting: Ambulatory Location: Ambulatory outpatient facility Delivery: Face-to-face Provider: Edward Jolly, MD Specialty: Interventional Pain Management Specialty designation: 09 Location: Outpatient facility Ref. Prov.: Enid Baas, MD       Interventional Therapy   Procedure:           Type: Steroid Intra-articular Knee Injection #1  Laterality: Right (-RT) Level/approach: Medial Imaging guidance: None required ([id number]) Anesthesia: Local anesthesia (1-2% Lidocaine) DOS: 05/01/2023  Performed by: Edward Jolly, MD  Purpose: Diagnostic/Therapeutic Indications: Knee arthralgia associated to osteoarthritis of the knee 1. Primary osteoarthritis of right knee    NAS-11 score:   Pre-procedure: 7 /10   Post-procedure: 7 /10     Pre-Procedure Preparation  Monitoring: As per clinic protocol.  Risk Assessment: Vitals:  [id number] body mass index is 33.36 kg/m as calculated from the following:   Height as of this encounter: 5\' 7"  (1.702 m).   Weight as of this encounter: 213 lb (96.6 kg)., Rate:70 , BP:127/76, Resp:15, Temp:98.1 F (36.7 C), SpO2:99 %  Allergies: She is allergic to cephalexin, cephalosporins, prochlorperazine, shrimp extract, bee venom, latex, other, and silicone.  Precautions: No additional precautions required  Blood-thinner(s): None at this time  Coagulopathies: Reviewed. None identified.   Active Infection(s): Reviewed. None identified. Sandy Byrd is  afebrile   Location setting: Exam room Position: Sitting w/ knee bent 90 degrees Safety Precautions: Patient was assessed for positional comfort and pressure points before starting the procedure. Prepping solution: DuraPrep (Iodine Povacrylex [0.7% available iodine] and Isopropyl Alcohol, 74% w/w) Prep Area: Entire knee region Approach: percutaneous, just above the tibial plateau, lateral to the infrapatellar tendon. Intended target: Intra-articular knee space Materials: Tray: Block Needle(s): Regular Qty: 1/side Length: 1.5-inch Gauge: 25G (x1) + 22G (x1)  Meds ordered this encounter  Medications   methylPREDNISolone acetate (DEPO-MEDROL) injection 40 mg   lidocaine HCl (PF) (XYLOCAINE) 2 % injection 5 mL   ropivacaine (PF) 2 mg/mL (0.2%) (NAROPIN) injection 4 mL    No orders of the defined types were placed in this encounter.    Time-out: 1347 I initiated and conducted the "Time-out" before starting the procedure, as per protocol. The patient was asked to participate by confirming the accuracy of the "Time Out" information. Verification of the correct person, site, and procedure were performed and confirmed by me, the nursing staff, and the patient. "Time-out" conducted as per Joint Commission's Universal Protocol (UP.01.01.01). Procedure checklist: Completed  H&P (Pre-op  Assessment)  Sandy Byrd is a 68 y.o. (year old), female patient, seen today for interventional treatment. She  has a past surgical history that includes Shoulder surgery (Right, 2016) and Shoulder surgery (Right, 2017). Sandy Byrd has a current medication list which includes the following prescription(s): acetaminophen, amoxicillin-clavulanate, benzonatate, celecoxib, ciprofloxacin, cyanocobalamin, cyclosporine, diazepam, epinephrine, ergocalciferol, escitalopram, fluticasone, folic acid, gabapentin, hydrocodone-acetaminophen, ketoconazole, levothyroxine, lidocaine, lidocaine, methocarbamol, metronidazole,  minocycline, prednisone, propylene glycol, simvastatin, trazodone, triamcinolone cream, triamterene-hydrochlorothiazide, ventolin hfa, vitamin d (ergocalciferol), and diclofenac. Her primarily concern today is the Knee Pain (right)  She is allergic to cephalexin, cephalosporins, prochlorperazine, shrimp extract, bee venom, latex, other, and silicone.   Last encounter: My last encounter  with her was on 04/10/2023. Pertinent problems: Sandy Byrd has Lumbar facet joint syndrome; Lumbar degenerative disc disease; Chronic pain syndrome; and H/O rotator cuff surgery (bilateral) on their pertinent problem list. Pain Assessment: Severity of Chronic pain is reported as a 7 /10. Location: Knee Right/down right leg and to hip. Onset: More than a month ago. Quality: Burning, Constant, Sharp, Radiating. Timing: Constant. Modifying factor(s): stretching, heat, ice. Vitals:  height is 5\' 7"  (1.702 m) and weight is 213 lb (96.6 kg). Her temporal temperature is 98.1 F (36.7 C). Her blood pressure is 127/76 and her pulse is 70. Her respiration is 15 and oxygen saturation is 99%.   Reason for encounter: "interventional pain management therapy due pain of at least four (4) weeks in duration, with failure to respond and/or inability to tolerate more conservative care.  Site Confirmation: Sandy Byrd was asked to confirm the procedure and laterality before marking the site.  Consent: Before the procedure and under the influence of no sedative(s), amnesic(s), or anxiolytics, the patient was informed of the treatment options, risks and possible complications. To fulfill our ethical and legal obligations, as recommended by the American Medical Association's Code of Ethics, I have informed the patient of my clinical impression; the nature and purpose of the treatment or procedure; the risks, benefits, and possible complications of the intervention; the alternatives, including doing nothing; the risk(s) and benefit(s) of  the alternative treatment(s) or procedure(s); and the risk(s) and benefit(s) of doing nothing. The patient was provided information about the general risks and possible complications associated with the procedure. These may include, but are not limited to: failure to achieve desired goals, infection, bleeding, organ or nerve damage, allergic reactions, paralysis, and death. In addition, the patient was informed of those risks and complications associated to Spine-related procedures, such as failure to decrease pain; infection (i.e.: Meningitis, epidural or intraspinal abscess); bleeding (i.e.: epidural hematoma, subarachnoid hemorrhage, or any other type of intraspinal or peri-dural bleeding); organ or nerve damage (i.e.: Any type of peripheral nerve, nerve root, or spinal cord injury) with subsequent damage to sensory, motor, and/or autonomic systems, resulting in permanent pain, numbness, and/or weakness of one or several areas of the body; allergic reactions; (i.e.: anaphylactic reaction); and/or death. Furthermore, the patient was informed of those risks and complications associated with the medications. These include, but are not limited to: allergic reactions (i.e.: anaphylactic or anaphylactoid reaction(s)); adrenal axis suppression; blood sugar elevation that in diabetics may result in ketoacidosis or comma; water retention that in patients with history of congestive heart failure may result in shortness of breath, pulmonary edema, and decompensation with resultant heart failure; weight gain; swelling or edema; medication-induced neural toxicity; particulate matter embolism and blood vessel occlusion with resultant organ, and/or nervous system infarction; and/or aseptic necrosis of one or more joints. Finally, the patient was informed that Medicine is not an exact science; therefore, there is also the possibility of unforeseen or unpredictable risks and/or possible complications that may result in a  catastrophic outcome. The patient indicated having understood very clearly. We have given the patient no guarantees and we have made no promises. Enough time was given to the patient to ask questions, all of which were answered to the patient's satisfaction. Ms. Cifuentes has indicated that she wanted to continue with the procedure. Attestation: I, the ordering provider, attest that I have discussed with the patient the benefits, risks, side-effects, alternatives, likelihood of achieving goals, and potential problems during recovery for the procedure that I have  provided informed consent.  Date  Time: 05/01/2023  1:13 PM  Description of procedure  Start Time: 1347 hrs  Local Anesthesia: Once the patient was positioned, prepped, and time-out was completed. The target area was identified located. The skin was marked with an approved surgical skin marker. Once marked, the skin (epidermis, dermis, and hypodermis), and deeper tissues (fat, connective tissue and muscle) were infiltrated with a small amount of a short-acting local anesthetic, loaded on a 10cc syringe with a 25G, 1.5-in  Needle. An appropriate amount of time was allowed for local anesthetics to take effect before proceeding to the next step. Local Anesthetic: Lidocaine 1-2% The unused portion of the local anesthetic was discarded in the proper designated containers. Safety Precautions: Aspiration looking for blood return was conducted prior to all injections. At no point did I inject any substances, as a needle was being advanced. Before injecting, the patient was told to immediately notify me if she was experiencing any new onset of "ringing in the ears, or metallic taste in the mouth". No attempts were made at seeking any paresthesias. Safe injection practices and needle disposal techniques used. Medications properly checked for expiration dates. SDV (single dose vial) medications used. After the completion of the procedure, all disposable  equipment used was discarded in the proper designated medical waste containers.  Technical description: Protocol guidelines were followed. After positioning, the target area was identified and prepped in the usual manner. Skin & deeper tissues infiltrated with local anesthetic. Appropriate amount of time allowed to pass for local anesthetics to take effect. Proper needle placement secured. Once satisfactory needle placement was confirmed, I proceeded to inject the desired solution in slow, incremental fashion, intermittently assessing for discomfort or any signs of abnormal or undesired spread of substance. Once completed, the needle was removed and disposed of, as per hospital protocols. The area was cleaned, making sure to leave some of the prepping solution back to take advantage of its long term bactericidal properties.  Aspiration:  Negative        Vitals:   05/01/23 1321  BP: 127/76  Pulse: 70  Resp: 15  Temp: 98.1 F (36.7 C)  TempSrc: Temporal  SpO2: 99%  Weight: 213 lb (96.6 kg)  Height: 5\' 7"  (1.702 m)    End Time: 1349 hrs  Imaging guidance  Imaging-assisted Technique: None required. Indication(s): N/A Exposure Time: N/A Contrast: None Fluoroscopic Guidance: N/A Ultrasound Guidance: N/A Interpretation: N/A  Post-op assessment  Post-procedure Vital Signs:  Pulse/HCG Rate: 70  Temp: 98.1 F (36.7 C) Resp: 15 BP: 127/76 SpO2: 99 %  EBL: None  Complications: No immediate post-treatment complications observed by team, or reported by patient.  Note: The patient tolerated the entire procedure well. A repeat set of vitals were taken after the procedure and the patient was kept under observation following institutional policy, for this type of procedure. Post-procedural neurological assessment was performed, showing return to baseline, prior to discharge. The patient was provided with post-procedure discharge instructions, including a section on how to identify  potential problems. Should any problems arise concerning this procedure, the patient was given instructions to immediately contact us, at any time, without hesitation. In any case, we plan to contact the patient by telephone for a follow-up status report regarding this interventional procedure.  Comments:  No additional relevant information.  Plan of care  Chronic Opioid Analgesic:  Norco 7.5 mg BID  prn #60 per month   Medications administered: We administered methylPREDNISolone acetate, lidocaine HCl (PF), and  ropivacaine (PF) 2 mg/mL (0.2%).  Follow-up plan:   Return for Keep sch. appt.      R L3,4,5 RFA 02/26/22; 08/29/22 repeat as needed.  Bilateral suprascapular nerve block 01/09/2023, right knee steroid 05/01/23        Recent Visits Date Type Provider Dept  04/03/23 Procedure visit Edward Jolly, MD [facility]  03/07/23 Office Visit Edward Jolly, MD [facility]  02/20/23 Office Visit Edward Jolly, MD [facility]  Showing recent visits within past 90 days and meeting all other requirements Today's Visits Date Type Provider Dept  05/01/23 Procedure visit Edward Jolly, MD [facility]  Showing today's visits and meeting all other requirements Future Appointments Date Type Provider Dept  07/02/23 Appointment Edward Jolly, MD [facility]  Showing future appointments within next 90 days and meeting all other requirements   Disposition: Discharge home  Discharge (Date  Time): 05/01/2023; 1400 hrs.   Primary Care Physician: Enid Baas, MD Location: [facility] Note by: Edward Jolly, MD Date: 05/01/2023; Time: 2:03 PM  DISCLAIMER: Medicine is not an exact science. It has no guarantees or warranties. The decision to proceed with this intervention was based on the information collected from the patient. Conclusions were drawn from the patient's questionnaire, interview, and examination. Because  information was provided in large part by the patient, it cannot be guaranteed that it has not been purposely or unconsciously manipulated or altered. Every effort has been made to obtain as much accurate, relevant, available data as possible. Always take into account that the treatment will also be dependent on availability of resources and existing treatment guidelines, considered by other Pain Management Specialists as being common knowledge and practice, at the time of the intervention. It is also important to point out that variation in procedural techniques and pharmacological choices are the acceptable norm. For Medico-Legal review purposes, the indications, contraindications, technique, and results of the these procedures should only be evaluated, judged and interpreted by a Board-Certified Interventional Pain Specialist with extensive familiarity and expertise in the same exact procedure and technique.

## 2023-05-02 ENCOUNTER — Telehealth: Payer: Self-pay

## 2023-05-02 NOTE — Telephone Encounter (Signed)
Post procedure follow up.  LM 

## 2023-05-20 ENCOUNTER — Other Ambulatory Visit: Payer: Self-pay | Admitting: Student

## 2023-05-20 DIAGNOSIS — R296 Repeated falls: Secondary | ICD-10-CM

## 2023-05-20 DIAGNOSIS — R2689 Other abnormalities of gait and mobility: Secondary | ICD-10-CM

## 2023-05-22 ENCOUNTER — Encounter: Payer: Self-pay | Admitting: Student

## 2023-05-28 ENCOUNTER — Ambulatory Visit
Admission: RE | Admit: 2023-05-28 | Discharge: 2023-05-28 | Disposition: A | Discharge status: Home / Self Care | Payer: Medicare Other | Source: Ambulatory Visit | Attending: Student | Admitting: Student

## 2023-05-28 DIAGNOSIS — R296 Repeated falls: Secondary | ICD-10-CM

## 2023-05-28 DIAGNOSIS — R2689 Other abnormalities of gait and mobility: Secondary | ICD-10-CM

## 2023-05-30 ENCOUNTER — Ambulatory Visit
Admission: RE | Admit: 2023-05-30 | Discharge: 2023-05-30 | Disposition: A | Discharge status: Home / Self Care | Payer: Medicare Other | Source: Ambulatory Visit | Attending: Student | Admitting: Student

## 2023-05-30 DIAGNOSIS — R296 Repeated falls: Secondary | ICD-10-CM

## 2023-05-30 DIAGNOSIS — R2689 Other abnormalities of gait and mobility: Secondary | ICD-10-CM

## 2023-07-02 ENCOUNTER — Encounter: Payer: Self-pay | Admitting: Student in an Organized Health Care Education/Training Program

## 2023-07-02 ENCOUNTER — Ambulatory Visit
Payer: Medicare Other | Attending: Student in an Organized Health Care Education/Training Program | Admitting: Student in an Organized Health Care Education/Training Program

## 2023-07-02 VITALS — BP 150/67 | HR 57 | Temp 97.9°F | Resp 18 | Ht 67.0 in | Wt 208.0 lb

## 2023-07-02 DIAGNOSIS — M5136 Other intervertebral disc degeneration, lumbar region: Secondary | ICD-10-CM

## 2023-07-02 DIAGNOSIS — G894 Chronic pain syndrome: Secondary | ICD-10-CM

## 2023-07-02 DIAGNOSIS — M47816 Spondylosis without myelopathy or radiculopathy, lumbar region: Secondary | ICD-10-CM | POA: Diagnosis not present

## 2023-07-02 MED ORDER — HYDROCODONE-ACETAMINOPHEN 7.5-325 MG PO TABS
1.0000 | ORAL_TABLET | Freq: Two times a day (BID) | ORAL | 0 refills | Status: AC | PRN
Start: 2023-08-09 — End: 2023-09-08

## 2023-07-02 MED ORDER — HYDROCODONE-ACETAMINOPHEN 7.5-325 MG PO TABS
1.0000 | ORAL_TABLET | Freq: Two times a day (BID) | ORAL | 0 refills | Status: AC | PRN
Start: 2023-07-10 — End: 2023-08-09

## 2023-07-02 MED ORDER — HYDROCODONE-ACETAMINOPHEN 7.5-325 MG PO TABS
1.0000 | ORAL_TABLET | Freq: Two times a day (BID) | ORAL | 0 refills | Status: AC | PRN
Start: 2023-09-08 — End: 2023-10-08

## 2023-07-02 NOTE — Patient Instructions (Signed)
GENERAL RISKS AND COMPLICATIONS ° °What are the risk, side effects and possible complications? °Generally speaking, most procedures are safe.  However, with any procedure there are risks, side effects, and the possibility of complications.  The risks and complications are dependent upon the sites that are lesioned, or the type of nerve block to be performed.  The closer the procedure is to the spine, the more serious the risks are.  Great care is taken when placing the radio frequency needles, block needles or lesioning probes, but sometimes complications can occur. °Infection: Any time there is an injection through the skin, there is a risk of infection.  This is why sterile conditions are used for these blocks.  There are four possible types of infection. °Localized skin infection. °Central Nervous System Infection-This can be in the form of Meningitis, which can be deadly. °Epidural Infections-This can be in the form of an epidural abscess, which can cause pressure inside of the spine, causing compression of the spinal cord with subsequent paralysis. This would require an emergency surgery to decompress, and there are no guarantees that the patient would recover from the paralysis. °Discitis-This is an infection of the intervertebral discs.  It occurs in about 1% of discography procedures.  It is difficult to treat and it may lead to surgery. ° °      2. Pain: the needles have to go through skin and soft tissues, will cause soreness. °      3. Damage to internal structures:  The nerves to be lesioned may be near blood vessels or   ° other nerves which can be potentially damaged. °      4. Bleeding: Bleeding is more common if the patient is taking blood thinners such as  aspirin, Coumadin, Ticiid, Plavix, etc., or if he/she have some genetic predisposition  such as hemophilia. Bleeding into the spinal canal can cause compression of the spinal  cord with subsequent paralysis.  This would require an emergency  surgery to  decompress and there are no guarantees that the patient would recover from the  paralysis. °      5. Pneumothorax:  Puncturing of a lung is a possibility, every time a needle is introduced in  the area of the chest or upper back.  Pneumothorax refers to free air around the  collapsed lung(s), inside of the thoracic cavity (chest cavity).  Another two possible  complications related to a similar event would include: Hemothorax and Chylothorax.   These are variations of the Pneumothorax, where instead of air around the collapsed  lung(s), you may have blood or chyle, respectively. °      6. Spinal headaches: They may occur with any procedures in the area of the spine. °      7. Persistent CSF (Cerebro-Spinal Fluid) leakage: This is a rare problem, but may occur  with prolonged intrathecal or epidural catheters either due to the formation of a fistulous  track or a dural tear. °      8. Nerve damage: By working so close to the spinal cord, there is always a possibility of  nerve damage, which could be as serious as a permanent spinal cord injury with  paralysis. °      9. Death:  Although rare, severe deadly allergic reactions known as "Anaphylactic  reaction" can occur to any of the medications used. °     10. Worsening of the symptoms:  We can always make thing worse. ° °What are the chances   of something like this happening? Chances of any of this occuring are extremely low.  By statistics, you have more of a chance of getting killed in a motor vehicle accident: while driving to the hospital than any of the above occurring .  Nevertheless, you should be aware that they are possibilities.  In general, it is similar to taking a shower.  Everybody knows that you can slip, hit your head and get killed.  Does that mean that you should not shower again?  Nevertheless always keep in mind that statistics do not mean anything if you happen to be on the wrong side of them.  Even if a procedure has a 1 (one) in a  1,000,000 (million) chance of going wrong, it you happen to be that one..Also, keep in mind that by statistics, you have more of a chance of having something go wrong when taking medications.  Who should not have this procedure? If you are on a blood thinning medication (e.g. Coumadin, Plavix, see list of "Blood Thinners"), or if you have an active infection going on, you should not have the procedure.  If you are taking any blood thinners, please inform your physician.  How should I prepare for this procedure? Do not eat or drink anything at least six hours prior to the procedure. Bring a driver with you .  It cannot be a taxi. Come accompanied by an adult that can drive you back, and that is strong enough to help you if your legs get weak or numb from the local anesthetic. Take all of your medicines the morning of the procedure with just enough water to swallow them. If you have diabetes, make sure that you are scheduled to have your procedure done first thing in the morning, whenever possible. If you have diabetes, take only half of your insulin dose and notify our nurse that you have done so as soon as you arrive at the clinic. If you are diabetic, but only take blood sugar pills (oral hypoglycemic), then do not take them on the morning of your procedure.  You may take them after you have had the procedure. Do not take aspirin or any aspirin-containing medications, at least eleven (11) days prior to the procedure.  They may prolong bleeding. Wear loose fitting clothing that may be easy to take off and that you would not mind if it got stained with Betadine or blood. Do not wear any jewelry or perfume Remove any nail coloring.  It will interfere with some of our monitoring equipment.  NOTE: Remember that this is not meant to be interpreted as a complete list of all possible complications.  Unforeseen problems may occur.  BLOOD THINNERS The following drugs contain aspirin or other products,  which can cause increased bleeding during surgery and should not be taken for 2 weeks prior to and 1 week after surgery.  If you should need take something for relief of minor pain, you may take acetaminophen which is found in Tylenol,m Datril, Anacin-3 and Panadol. It is not blood thinner. The products listed below are.  Do not take any of the products listed below in addition to any listed on your instruction sheet.  A.P.C or A.P.C with Codeine Codeine Phosphate Capsules #3 Ibuprofen Ridaura  ABC compound Congesprin Imuran rimadil  Advil Cope Indocin Robaxisal  Alka-Seltzer Effervescent Pain Reliever and Antacid Coricidin or Coricidin-D  Indomethacin Rufen  Alka-Seltzer plus Cold Medicine Cosprin Ketoprofen S-A-C Tablets  Anacin Analgesic Tablets or Capsules Coumadin   Korlgesic Salflex  Anacin Extra Strength Analgesic tablets or capsules CP-2 Tablets Lanoril Salicylate  Anaprox Cuprimine Capsules Levenox Salocol  Anexsia-D Dalteparin Magan Salsalate  Anodynos Darvon compound Magnesium Salicylate Sine-off  Ansaid Dasin Capsules Magsal Sodium Salicylate  Anturane Depen Capsules Marnal Soma  APF Arthritis pain formula Dewitt's Pills Measurin Stanback  Argesic Dia-Gesic Meclofenamic Sulfinpyrazone  Arthritis Bayer Timed Release Aspirin Diclofenac Meclomen Sulindac  Arthritis pain formula Anacin Dicumarol Medipren Supac  Analgesic (Safety coated) Arthralgen Diffunasal Mefanamic Suprofen  Arthritis Strength Bufferin Dihydrocodeine Mepro Compound Suprol  Arthropan liquid Dopirydamole Methcarbomol with Aspirin Synalgos  ASA tablets/Enseals Disalcid Micrainin Tagament  Ascriptin Doan's Midol Talwin  Ascriptin A/D Dolene Mobidin Tanderil  Ascriptin Extra Strength Dolobid Moblgesic Ticlid  Ascriptin with Codeine Doloprin or Doloprin with Codeine Momentum Tolectin  Asperbuf Duoprin Mono-gesic Trendar  Aspergum Duradyne Motrin or Motrin IB Triminicin  Aspirin plain, buffered or enteric coated  Durasal Myochrisine Trigesic  Aspirin Suppositories Easprin Nalfon Trillsate  Aspirin with Codeine Ecotrin Regular or Extra Strength Naprosyn Uracel  Atromid-S Efficin Naproxen Ursinus  Auranofin Capsules Elmiron Neocylate Vanquish  Axotal Emagrin Norgesic Verin  Azathioprine Empirin or Empirin with Codeine Normiflo Vitamin E  Azolid Emprazil Nuprin Voltaren  Bayer Aspirin plain, buffered or children's or timed BC Tablets or powders Encaprin Orgaran Warfarin Sodium  Buff-a-Comp Enoxaparin Orudis Zorpin  Buff-a-Comp with Codeine Equegesic Os-Cal-Gesic   Buffaprin Excedrin plain, buffered or Extra Strength Oxalid   Bufferin Arthritis Strength Feldene Oxphenbutazone   Bufferin plain or Extra Strength Feldene Capsules Oxycodone with Aspirin   Bufferin with Codeine Fenoprofen Fenoprofen Pabalate or Pabalate-SF   Buffets II Flogesic Panagesic   Buffinol plain or Extra Strength Florinal or Florinal with Codeine Panwarfarin   Buf-Tabs Flurbiprofen Penicillamine   Butalbital Compound Four-way cold tablets Penicillin   Butazolidin Fragmin Pepto-Bismol   Carbenicillin Geminisyn Percodan   Carna Arthritis Reliever Geopen Persantine   Carprofen Gold's salt Persistin   Chloramphenicol Goody's Phenylbutazone   Chloromycetin Haltrain Piroxlcam   Clmetidine heparin Plaquenil   Cllnoril Hyco-pap Ponstel   Clofibrate Hydroxy chloroquine Propoxyphen         Before stopping any of these medications, be sure to consult the physician who ordered them.  Some, such as Coumadin (Warfarin) are ordered to prevent or treat serious conditions such as "deep thrombosis", "pumonary embolisms", and other heart problems.  The amount of time that you may need off of the medication may also vary with the medication and the reason for which you were taking it.  If you are taking any of these medications, please make sure you notify your pain physician before you undergo any procedures.         Moderate Conscious  Sedation, Adult Sedation is the use of medicines to help you relax and not feel pain. Moderate conscious sedation is a type of sedation that makes you less alert than normal. You are still able to respond to instructions, touch, or both. This type of sedation is used during short medical and dental procedures. It is milder than deep sedation, which is a type of sedation you cannot be easily woken up from. It is also milder than general anesthesia, which is the use of medicines to make you fall asleep. Moderate conscious sedation lets you return to your normal activities sooner. Tell a health care provider about: Any allergies you have. All medicines you are taking, including vitamins, herbs, steroids, eye drops, creams, and over-the-counter medicines. Any problems you or family members have had with anesthesia.   Any bleeding problems you have. Any surgeries you have had. Any medical conditions you have. Whether you are pregnant or may be pregnant. Any recent alcohol, tobacco, or drug use. What are the risks? Your health care provider will talk with you about risks. These may include: Oversedation. This is when you get too much medicine. Nausea or vomiting. Allergic reaction to medicines. Trouble breathing. If this happens, a breathing tube may be used. It will be removed when you can breathe better on your own. Heart trouble. Lung trouble. Emergence delirium. This is when you feel confused while the sedation wears off. This gets better with time. What happens before the procedure? When to stop eating and drinking Follow instructions from your health care provider about what you may eat and drink. These may include: 8 hours before your procedure Stop eating most foods. Do not eat meat, fried foods, or fatty foods. Eat only light foods, such as toast or crackers. All liquids are okay except energy drinks and alcohol. 6 hours before your procedure Stop eating. Drink only clear liquids, such  as water, clear fruit juice, black coffee, plain tea, and sports drinks. Do not drink energy drinks or alcohol. 2 hours before your procedure Stop drinking all liquids. You may be allowed to take medicines with small sips of water. If you do not follow your health care provider's instructions, your procedure may be delayed or canceled. Medicines Ask your health care provider about: Changing or stopping your regular medicines. These include any diabetes medicines or blood thinners you take. Taking medicines such as aspirin and ibuprofen. These medicines can thin your blood. Do not take them unless your health care provider tells you to. Taking over-the-counter medicines, vitamins, herbs, and supplements. Tests and exams You may have an exam or testing. You may have a blood or urine sample taken. General instructions Do not use any products that contain nicotine or tobacco for at least 4 weeks before the procedure. These products include cigarettes, chewing tobacco, and vaping devices, such as e-cigarettes. If you need help quitting, ask your health care provider. If you will be going home right after the procedure, plan to have a responsible adult: Take you home from the hospital or clinic. You will not be allowed to drive. Care for you for the time you are told. What happens during the procedure?  You will be given the sedative. It may be given: As a pill you can take by mouth. It can also be put into the rectum. As a spray through the nose. As an injection into muscle. As an injection into a vein through an IV. You may be given oxygen as needed. Your blood pressure, heart rate, breathing rate, and blood oxygen level will be monitored during the procedure. The medical or dental procedure will be done. The procedure may vary among health care providers and hospitals. What happens after the procedure? Your blood pressure, heart rate, breathing rate, and blood oxygen level will be  monitored until you leave the hospital or clinic. You will get fluids through an IV as needed. Do not drive or operate machinery until your health care provider says that it is safe. This information is not intended to replace advice given to you by your health care provider. Make sure you discuss any questions you have with your health care provider. Document Revised: 07/02/2022 Document Reviewed: 07/02/2022 Elsevier Patient Education  2024 [employer] Radiofrequency Ablation Radiofrequency ablation is a procedure that is performed to relieve pain. The  procedure is often used for back, neck, or arm pain. Radiofrequency ablation involves the use of a machine that creates radio waves to make heat. During the procedure, the heat is applied to the nerve that carries the pain signal. The heat damages the nerve and interferes with the pain signal. Pain relief usually starts about 2 weeks after the procedure and lasts for 6 months to 1 year. Tell a health care provider about: Any allergies you have. All medicines you are taking, including vitamins, herbs, eye drops, creams, and over-the-counter medicines. Any problems you or family members have had with anesthetic medicines. Any bleeding problems you have. Any surgeries you have had. Any medical conditions you have. Whether you are pregnant or may be pregnant. What are the risks? Generally, this is a safe procedure. However, problems may occur, including: Pain or soreness at the injection site. Allergic reaction to medicines given during the procedure. Bleeding. Infection at the injection site. Damage to nerves or blood vessels. What happens before the procedure? When to stop eating and drinking Follow instructions from your health care provider about what you may eat and drink before your procedure. These may include: 8 hours before the procedure Stop eating most foods. Do not eat meat, fried foods, or fatty foods. Eat only light foods,  such as toast or crackers. All liquids are okay except energy drinks and alcohol. 6 hours before the procedure Stop eating. Drink only clear liquids, such as water, clear fruit juice, black coffee, plain tea, and sports drinks. Do not drink energy drinks or alcohol. 2 hours before the procedure Stop drinking all liquids. You may be allowed to take medicine with small sips of water. If you do not follow your health care provider's instructions, your procedure may be delayed or canceled. Medicines Ask your health care provider about: Changing or stopping your regular medicines. This is especially important if you are taking diabetes medicines or blood thinners. Taking medicines such as aspirin and ibuprofen. These medicines can thin your blood. Do not take these medicines unless your health care provider tells you to take them. Taking over-the-counter medicines, vitamins, herbs, and supplements. General instructions Ask your health care provider what steps will be taken to help prevent infection. These steps may include: Removing hair at the procedure site. Washing skin with a germ-killing soap. Taking antibiotic medicine. If you will be going home right after the procedure, plan to have a responsible adult: Take you home from the hospital or clinic. You will not be allowed to drive. Care for you for the time you are told. What happens during the procedure?  You will be awake during the procedure. You will need to be able to talk with the health care provider during the procedure. An IV will be inserted into one of your veins. You will be given one or more of the following: A medicine to help you relax (sedative). A medicine to numb the area (local anesthetic). Your health care provider will insert a radiofrequency needle into the area to be treated. This is done with the help of fluoroscopy. A wire that carries the radio waves (electrode) will be put through the radiofrequency  needle. An electrical pulse will be sent through the electrode to verify the correct nerve that is causing your pain. You will feel a tingling sensation, and you may have muscle twitching. The tissue around the needle tip will be heated by an electric current that comes from the radiofrequency machine. This will numb the nerves.  The needle will be removed. A bandage (dressing) will be put on the insertion area. The procedure may vary among health care providers and hospitals. What happens after the procedure? Your blood pressure, heart rate, breathing rate, and blood oxygen level will be monitored until you leave the hospital or clinic. Return to your normal activities as told by your health care provider. Ask your health care provider what activities are safe for you. If you were given a sedative during the procedure, it can affect you for several hours. Do not drive or operate machinery until your health care provider says that it is safe. Summary Radiofrequency ablation is a procedure that is performed to relieve pain. The procedure is often used for back, neck, or arm pain. Radiofrequency ablation involves the use of a machine that creates radio waves to make heat. Plan to have a responsible adult take you home from the hospital or clinic. Do not drive or operate machinery until your health care provider says that it is safe. Return to your normal activities as told by your health care provider. Ask your health care provider what activities are safe for you. This information is not intended to replace advice given to you by your health care provider. Make sure you discuss any questions you have with your health care provider. Document Revised: 06/06/2021 Document Reviewed: 06/06/2021 Elsevier Patient Education  2024 [employer].

## 2023-07-02 NOTE — Progress Notes (Signed)
Nursing Pain Medication Assessment:  Safety precautions to be maintained throughout the outpatient stay will include: orient to surroundings, keep bed in low position, maintain call bell within reach at all times, provide assistance with transfer out of bed and ambulation.  Medication Inspection Compliance: Pill count conducted under aseptic conditions, in front of the patient. Neither the pills nor the bottle was removed from the patient's sight at any time. Once count was completed pills were immediately returned to the patient in their original bottle.  Medication: Hydrocodone/APAP Pill/Patch Count:  14 of 60 pills remain Pill/Patch Appearance: Markings consistent with prescribed medication Bottle Appearance: Standard pharmacy container. Clearly labeled. Filled Date: 06 / 10 / 2024 Last Medication intake:  TodaySafety precautions to be maintained throughout the outpatient stay will include: orient to surroundings, keep bed in low position, maintain call bell within reach at all times, provide assistance with transfer out of bed and ambulation.

## 2023-07-02 NOTE — Progress Notes (Signed)
PROVIDER NOTE: Information contained herein reflects review and annotations entered in association with encounter. Interpretation of such information and data should be left to medically-trained personnel. Information provided to patient can be located elsewhere in the medical record under "Patient Instructions". Document created using STT-dictation technology, any transcriptional errors that may result from process are unintentional.    Patient: Sandy Byrd  Service Category: E/M  Provider: Edward Jolly, MD  DOB: 1955/08/16  DOS: 07/02/2023  Referring Provider: Enid Baas, MD  MRN: [id number]  Specialty: Interventional Pain Management  PCP: Enid Baas, MD  Type: Established Patient  Setting: Ambulatory outpatient    Location: Office  Delivery: Face-to-face     HPI  Sandy Byrd, a 68 y.o. year old female, is here today because of her Lumbar facet joint syndrome [M47.816]. Ms. Terris's primary complain today is Back Pain (Right, lower)  Pertinent problems: Ms. Langwell has Lumbar spondylosis; Lumbar degenerative disc disease; Chronic pain syndrome; and H/O rotator cuff surgery (bilateral) on their pertinent problem list. Pain Assessment: Severity of Chronic pain is reported as a 7 /10. Location: Back Right, Lower/right upper leg. Onset: More than a month ago. Quality: Sharp, Burning, Stabbing. Timing: Constant. Modifying factor(s): medications, stretching, walking. Vitals:  height is 5\' 7"  (1.702 m) and weight is 208 lb (94.3 kg). Her temporal temperature is 97.9 F (36.6 C). Her blood pressure is 150/67 (abnormal) and her pulse is 57 (abnormal). Her respiration is 18 and oxygen saturation is 99%.  BMI: Estimated body mass index is 32.58 kg/m as calculated from the following:   Height as of this encounter: 5\' 7"  (1.702 m).   Weight as of this encounter: 208 lb (94.3 kg). Last encounter: 03/07/2023. Last procedure: 05/01/2023.  Reason for encounter: both, medication  management and post-procedure evaluation and assessment   Patient presents today for medication management.  She is also endorsing increased low back pain on the right side secondary to lumbar facet arthropathy lumbar spondylosis.  She is status post right L3, L4, L5 lumbar radiofrequency ablation on 08/29/2022 that provided her with approximately 80% pain relief on her right side for approximately 10months.  Given increased pain, she is requesting a repeat right lumbar radiofrequency ablation.  She is endorsing pain relief after her right knee steroid injection done 05/01/2023.  Pharmacotherapy Assessment  Analgesic: Norco 7.5 mg BID  prn #60 per month   Monitoring: [place] PMP: PDMP reviewed during this encounter.       Pharmacotherapy: No side-effects or adverse reactions reported. Compliance: No problems identified. Effectiveness: Clinically acceptable.  Concepcion Elk, RN  07/02/2023 11:12 AM  Sign when Signing Visit Nursing Pain Medication Assessment:  Safety precautions to be maintained throughout the outpatient stay will include: orient to surroundings, keep bed in low position, maintain call bell within reach at all times, provide assistance with transfer out of bed and ambulation.  Medication Inspection Compliance: Pill count conducted under aseptic conditions, in front of the patient. Neither the pills nor the bottle was removed from the patient's sight at any time. Once count was completed pills were immediately returned to the patient in their original bottle.  Medication: Hydrocodone/APAP Pill/Patch Count:  14 of 60 pills remain Pill/Patch Appearance: Markings consistent with prescribed medication Bottle Appearance: Standard pharmacy container. Clearly labeled. Filled Date: 06 / 10 / 2024 Last Medication intake:  TodaySafety precautions to be maintained throughout the outpatient stay will include: orient to surroundings, keep bed in low position, maintain call bell within reach at all  times, provide assistance with transfer out of bed and ambulation.     No results found for: "CBDTHCR" No results found for: "D8THCCBX" No results found for: "D9THCCBX"  UDS:  Summary  Date Value Ref Range Status  09/18/2022 Note  Final    Comment:    ==================================================================== [employer] 13 (MW) ==================================================================== Test                             Result       Flag       Units  Drug Present and Declared for Prescription Verification   Norhydrocodone                 119          EXPECTED   ng/mg creat    Norhydrocodone is an expected metabolite of hydrocodone.  Drug Present not Declared for Prescription Verification   Oxazepam                       27           UNEXPECTED ng/mg creat    Oxazepam may be administered as a scheduled prescription medication;    it is also an expected metabolite of other benzodiazepine drugs,    including diazepam, chlordiazepoxide, prazepam, clorazepate,    halazepam, and temazepam.  Drug Absent but Declared for Prescription Verification   Hydrocodone                    Not Detected UNEXPECTED ng/mg creat    Hydrocodone is almost always present in patients taking this drug    consistently. Absence of hydrocodone could be due to lapse of time    since the last dose or unusual pharmacokinetics (rapid metabolism).  ==================================================================== Test                      Result    Flag   Units      Ref Range   Creatinine              73               mg/dL      >=16 ==================================================================== Declared Medications:  The flagging and interpretation on this report are based on the  following declared medications.  Unexpected results may arise from  inaccuracies in the declared medications.   **Note: The testing scope of this panel includes these medications:   Hydrocodone  (Norco)   **Note: The testing scope of this panel does not include the  following reported medications:   Acetaminophen (Tylenol)  Acetaminophen (Norco)  Albuterol (Ventolin HFA)  Aspirin  Celecoxib (Celebrex)  Cyanocobalamin  Diclofenac (Voltaren)  Epinephrine (EpiPen)  Escitalopram (Lexapro)  Eye Drop  Fluticasone (Flonase)  Folic Acid  Gabapentin (Neurontin)  Hydrochlorothiazide (Dyazide)  Ketoconazole (Nizoral)  Levothyroxine (Synthroid)  Methocarbamol (Robaxin)  Metronidazole (MetroGel)  Minocycline (Minocin)  Polyethylene Glycol  Simvastatin (Zocor)  Topical Lidocaine (Lidoderm)  Trazodone (Desyrel)  Triamcinolone (Kenalog)  Triamterene (Dyazide)  Vitamin D2 ==================================================================== For clinical consultation, please call [phone]. ====================================================================       ROS  Constitutional: Denies any fever or chills Gastrointestinal: No reported hemesis, hematochezia, vomiting, or acute GI distress Musculoskeletal:  Right low back pain Neurological: No reported episodes of acute onset apraxia, aphasia, dysarthria, agnosia, amnesia, paralysis, loss of coordination, or loss of consciousness  Medication Review  EPINEPHrine, HYDROcodone-acetaminophen, Propylene  Glycol, Vitamin D (Ergocalciferol), acetaminophen, albuterol, benzonatate, celecoxib, cyanocobalamin, cycloSPORINE, ergocalciferol, escitalopram, fluticasone, folic acid, gabapentin, ketoconazole, levothyroxine, lidocaine, methocarbamol, metroNIDAZOLE, simvastatin, traZODone, triamcinolone cream, and triamterene-hydrochlorothiazide  History Review  Allergy: Ms. Bolas is allergic to cephalexin, cephalosporins, prochlorperazine, shrimp extract, bee venom, latex, other, and silicone. Drug: Ms. Landgrebe  reports no history of drug use. Alcohol:  reports current alcohol use. Tobacco:  reports that she quit smoking about  21 years ago. Her smoking use included cigarettes. She has never been exposed to tobacco smoke. She has never used smokeless tobacco. Social: Ms. Pressler  reports that she quit smoking about 21 years ago. Her smoking use included cigarettes. She has never been exposed to tobacco smoke. She has never used smokeless tobacco. She reports current alcohol use. She reports that she does not use drugs. Medical:  has a past medical history of Anxiety, Arthritis, Depression, Hypertension, Rosacea, and Thyroid disease. Surgical: Ms. Santell  has a past surgical history that includes Shoulder surgery (Right, 2016) and Shoulder surgery (Right, 2017). Family: family history is not on file.  Laboratory Chemistry Profile   Renal No results found for: "BUN", "CREATININE", "LABCREA", "BCR", "GFR", "GFRAA", "GFRNONAA", "LABVMA", "EPIRU", "EPINEPH24HUR", "NOREPRU", "NOREPI24HUR", "DOPARU", "DOPAM24HRUR"  Hepatic No results found for: "AST", "ALT", "ALBUMIN", "ALKPHOS", "HCVAB", "AMYLASE", "LIPASE", "AMMONIA"  Electrolytes No results found for: "NA", "K", "CL", "CALCIUM", "MG", "PHOS"  Bone No results found for: "VD25OH", "VD125OH2TOT", "ZO1096EA5", "WU9811BJ4", "25OHVITD1", "25OHVITD2", "25OHVITD3", "TESTOFREE", "TESTOSTERONE"  Inflammation (CRP: Acute Phase) (ESR: Chronic Phase) No results found for: "CRP", "ESRSEDRATE", "LATICACIDVEN"       Note: Above Lab results reviewed.  Recent Imaging Review  MR BRAIN WO CONTRAST CLINICAL DATA:  68 year old female with disequilibrium. Pain, depression, walks with a cane.  EXAM: MRI HEAD WITHOUT CONTRAST  TECHNIQUE: Multiplanar, multiecho pulse sequences of the brain and surrounding structures were obtained without intravenous contrast.  COMPARISON:  Cervical spine MRI 05/28/2023.  FINDINGS: Brain: Cerebral volume is within normal limits for age. No restricted diffusion to suggest acute infarction. No midline shift, mass effect, evidence of mass lesion,  ventriculomegaly, extra-axial collection or acute intracranial hemorrhage. Cervicomedullary junction and pituitary are within normal limits.  Scattered mild or at most moderate for age small cerebral white matter T2 and FLAIR hyperintensity in a nonspecific configuration. Frontal lobes are most affected, more so the left. No cortical encephalomalacia or chronic cerebral blood products. Deep gray nuclei are within normal limits. However, there is mild patchy T2 and FLAIR heterogeneity throughout the pons. And there are several small chronic lacunar type infarcts in the bilateral cerebellum (series 11, images 8 and 9).  Vascular: Major intracranial vascular flow voids are preserved. The distal right vertebral artery appears somewhat dominant.  Skull and upper cervical spine: Negative for age visible cervical spine. Visualized bone marrow signal is within normal limits.  Sinuses/Orbits: Negative.  Other: Mild right mastoid air cell effusion, probably postinflammatory and significance doubtful. Left mastoids are clear. Other Visible internal auditory structures appear normal. Normal stylomastoid foramina. Negative visible scalp and face.  IMPRESSION: No acute intracranial abnormality. But overall moderate for age signal changes in the brain most compatible with chronic small vessel disease, including pontine involvement and several small chronic cerebellar infarcts.  Electronically Signed   By: Odessa Fleming M.D.   On: 05/30/2023 08:17 Note: Reviewed        Physical Exam  General appearance: Well nourished, well developed, and well hydrated. In no apparent acute distress Mental status: Alert, oriented x 3 (person, place, &  time)       Respiratory: No evidence of acute respiratory distress Eyes: PERLA Vitals: BP (!) 150/67   Pulse (!) 57   Temp 97.9 F (36.6 C) (Temporal)   Resp 18   Ht 5\' 7"  (1.702 m)   Wt 208 lb (94.3 kg)   SpO2 99%   BMI 32.58 kg/m  BMI: Estimated body  mass index is 32.58 kg/m as calculated from the following:   Height as of this encounter: 5\' 7"  (1.702 m).   Weight as of this encounter: 208 lb (94.3 kg). Ideal: Ideal body weight: 61.6 kg (135 lb 12.9 oz) Adjusted ideal body weight: 74.7 kg (164 lb 10.9 oz)  Right low back pain, pain worse with lumbar extension and facet loading  Assessment   Diagnosis Status  1. Lumbar facet joint syndrome   2. Lumbar facet arthropathy   3. Lumbar degenerative disc disease   4. Lumbar spondylosis   5. Chronic pain syndrome    Having a Flare-up Having a Flare-up Persistent   Updated Problems: Problem  Lumbar Spondylosis    Plan of Care   Ms. CHANITA HUBBY has a current medication list which includes the following long-term medication(s): escitalopram, fluticasone, gabapentin, levothyroxine, trazodone, triamterene-hydrochlorothiazide, ventolin hfa, and simvastatin.  Pharmacotherapy (Medications Ordered): Meds ordered this encounter  Medications   HYDROcodone-acetaminophen (NORCO) 7.5-325 MG tablet    Sig: Take 1 tablet by mouth 2 (two) times daily as needed.    Dispense:  60 tablet    Refill:  0   HYDROcodone-acetaminophen (NORCO) 7.5-325 MG tablet    Sig: Take 1 tablet by mouth 2 (two) times daily as needed.    Dispense:  60 tablet    Refill:  0   HYDROcodone-acetaminophen (NORCO) 7.5-325 MG tablet    Sig: Take 1 tablet by mouth 2 (two) times daily as needed.    Dispense:  60 tablet    Refill:  0   Orders:  Orders Placed This Encounter  Procedures   Radiofrequency,Lumbar    Standing Status:   Future    Standing Expiration Date:   10/02/2023    Scheduling Instructions:     Side(s): RIGHT #2     Level(s): , L4, L5,  Medial Branch Nerve(s)     Sedation: Po valium     Scheduling Timeframe: As soon as Designer, industrial/product Question:   Where will this procedure be performed?    Answer:   [facility]   Follow-up plan:   Return in about 22 days (around  07/24/2023) for Right L3, 4, 5 RFA , in clinic (PO Valium).      R L3,4,5 RFA 02/26/22; 08/29/22 repeat as needed.  Bilateral suprascapular nerve block 01/09/2023, right knee steroid 05/01/23      Recent Visits Date Type Provider Dept  05/01/23 Procedure visit Edward Jolly, MD [facility]  04/03/23 Procedure visit Edward Jolly, MD [facility]  Showing recent visits within past 90 days and meeting all other requirements Today's Visits Date Type Provider Dept  07/02/23 Office Visit Edward Jolly, MD [facility]  Showing today's visits and meeting all other requirements Future Appointments No visits were found meeting these conditions. Showing future appointments within next 90 days and meeting all other requirements  I discussed the assessment and treatment plan with the patient. The patient was provided an opportunity to ask questions and all were answered. The patient agreed with the plan and demonstrated an understanding of the  instructions.  Patient advised to call back or seek an in-person evaluation if the symptoms or condition worsens.  Duration of encounter: .  Total time on encounter, as per AMA guidelines included both the face-to-face and non-face-to-face time personally spent by the physician and/or other qualified health care professional(s) on the day of the encounter (includes time in activities that require the physician or other qualified health care professional and does not include time in activities normally performed by clinical staff). Physician's time may include the following activities when performed: Preparing to see the patient (e.g., pre-charting review of records, searching for previously ordered imaging, lab work, and nerve conduction tests) Review of prior analgesic pharmacotherapies. Reviewing PMP Interpreting ordered tests (e.g., lab work, imaging, nerve conduction tests) Performing post-procedure evaluations, including  interpretation of diagnostic procedures Obtaining and/or reviewing separately obtained history Performing a medically appropriate examination and/or evaluation Counseling and educating the patient/family/caregiver Ordering medications, tests, or procedures Referring and communicating with other health care professionals (when not separately reported) Documenting clinical information in the electronic or other health record Independently interpreting results (not separately reported) and communicating results to the patient/ family/caregiver Care coordination (not separately reported)  Note by: Edward Jolly, MD Date: 07/02/2023; Time: 11:41 AM

## 2023-07-31 ENCOUNTER — Ambulatory Visit
Payer: Medicare Other | Attending: Student in an Organized Health Care Education/Training Program | Admitting: Student in an Organized Health Care Education/Training Program

## 2023-07-31 ENCOUNTER — Ambulatory Visit
Admission: RE | Admit: 2023-07-31 | Discharge: 2023-07-31 | Disposition: A | Discharge status: Home / Self Care | Payer: Medicare Other | Source: Ambulatory Visit | Attending: Student in an Organized Health Care Education/Training Program | Admitting: Student in an Organized Health Care Education/Training Program

## 2023-07-31 ENCOUNTER — Encounter: Payer: Self-pay | Admitting: Student in an Organized Health Care Education/Training Program

## 2023-07-31 DIAGNOSIS — G894 Chronic pain syndrome: Secondary | ICD-10-CM | POA: Diagnosis present

## 2023-07-31 DIAGNOSIS — M47816 Spondylosis without myelopathy or radiculopathy, lumbar region: Secondary | ICD-10-CM | POA: Diagnosis present

## 2023-07-31 MED ORDER — DIAZEPAM 5 MG PO TABS
5.0000 mg | ORAL_TABLET | ORAL | Status: AC
  Administered 2023-07-31: 5 mg via ORAL

## 2023-07-31 MED ORDER — ROPIVACAINE HCL 2 MG/ML IJ SOLN
INTRAMUSCULAR | Status: AC
  Filled 2023-07-31: qty 20

## 2023-07-31 MED ORDER — ROPIVACAINE HCL 2 MG/ML IJ SOLN
9.0000 mL | Freq: Once | INTRAMUSCULAR | Status: AC
  Administered 2023-07-31: 9 mL via PERINEURAL

## 2023-07-31 MED ORDER — DEXAMETHASONE SODIUM PHOSPHATE 10 MG/ML IJ SOLN
INTRAMUSCULAR | Status: AC
  Filled 2023-07-31: qty 1

## 2023-07-31 MED ORDER — PREGABALIN 100 MG PO CAPS: 100.0000 mg | ORAL_CAPSULE | Freq: Three times a day (TID) | ORAL | 2 refills | Status: DC

## 2023-07-31 MED ORDER — LIDOCAINE HCL 2 % IJ SOLN
20.0000 mL | Freq: Once | INTRAMUSCULAR | Status: AC
  Administered 2023-07-31: 400 mg

## 2023-07-31 MED ORDER — DIAZEPAM 5 MG PO TABS
ORAL_TABLET | ORAL | Status: AC
  Filled 2023-07-31: qty 1

## 2023-07-31 MED ORDER — DEXAMETHASONE SODIUM PHOSPHATE 10 MG/ML IJ SOLN
10.0000 mg | Freq: Once | INTRAMUSCULAR | Status: AC
  Administered 2023-07-31: 10 mg

## 2023-07-31 MED ORDER — LIDOCAINE HCL 2 % IJ SOLN
INTRAMUSCULAR | Status: AC
  Filled 2023-07-31: qty 20

## 2023-07-31 NOTE — Progress Notes (Signed)
PROVIDER NOTE: Interpretation of information contained herein should be left to medically-trained personnel. Specific patient instructions are provided elsewhere under "Patient Instructions" section of medical record. This document was created in part using STT-dictation technology, any transcriptional errors that may result from this process are unintentional.  Patient: Sandy Byrd Type: Established DOB: May 19, 1955 MRN: [id number] PCP: Enid Baas, MD  Service: Procedure DOS: 07/31/2023 Setting: Ambulatory Location: Ambulatory outpatient facility Delivery: Face-to-face Provider: Edward Jolly, MD Specialty: Interventional Pain Management Specialty designation: 09 Location: Outpatient facility Ref. Prov.: Edward Jolly, MD    Primary Reason for Visit: Interventional Pain Management Treatment. CC: Back Pain (Right, lower)  Procedure #1:   Type: Lumbar Facet, Medial Branch Radiofrequency Ablation (RFA) #3  Laterality: Right  Level: L3, L4, L5, Medial Branch Level(s). These levels will denervate the L3-4 and L5-S1 lumbar facet joints.  Imaging: Fluoroscopic guidance Anesthesia: Local anesthesia (1-2% Lidocaine) Anxiolysis: Oral Valium 5 mg PO Sedation:  Minimal . DOS: 07/31/2023  Performed by: Edward Jolly, MD  Purpose: Therapeutic/Palliative Indications: Low back pain severe enough to impact quality of life or function. Indications: 1. Lumbar facet joint syndrome   2. Chronic pain syndrome    Sandy Byrd has been dealing with the above chronic pain for longer than three months and has either failed to respond, was unable to tolerate, or simply did not get enough benefit from other more conservative therapies including, but not limited to: 1. Over-the-counter medications 2. Anti-inflammatory medications 3. Muscle relaxants 4. Membrane stabilizers 5. Opioids 6. Physical therapy and/or chiropractic manipulation 7. Modalities (Heat, ice, etc.) 8. Invasive techniques  such as nerve blocks. Sandy Byrd has attained more than 50% relief of the pain from a series of diagnostic injections conducted in separate occasions.  Pain Score: Pre-procedure: 8 /10 Post-procedure: 8 /10     Position / Prep / Materials:  Position: Prone  Prep solution: DuraPrep (Iodine Povacrylex [0.7% available iodine] and Isopropyl Alcohol, 74% w/w) Prep Area: Entire Lumbosacral Region (Lower back from mid-thoracic region to end of tailbone and from flank to flank.) Materials:  Tray: RFA (Radiofrequency) tray Needle(s):  Type: RFA (Teflon-coated radiofrequency ablation needles) Gauge (G): 22  Length: Regular (10cm) Qty: 3  Pre-op H&P Assessment:  Sandy Byrd is a 68 y.o. (year old), female patient, seen today for interventional treatment. She  has a past surgical history that includes Shoulder surgery (Right, 2016) and Shoulder surgery (Right, 2017). Sandy Byrd has a current medication list which includes the following prescription(s): acetaminophen, ascorbic acid, baclofen, benzonatate, celecoxib, cyclosporine, epinephrine, ergocalciferol, escitalopram, fluticasone, folic acid, hydrocodone-acetaminophen, [START ON 08/09/2023] hydrocodone-acetaminophen, [START ON 09/08/2023] hydrocodone-acetaminophen, ketoconazole, levothyroxine, lidocaine, lidocaine, magnesium, metronidazole, pregabalin, propylene glycol, trazodone, triamcinolone cream, triamterene-hydrochlorothiazide, turmeric, ventolin hfa, vitamin d (ergocalciferol), cyanocobalamin, and simvastatin. Her primarily concern today is the Back Pain (Right, lower)  Initial Vital Signs:  Pulse/HCG Rate: 64ECG Heart Rate: 63 Temp:  (!) 97.3 F (36.3 C) Resp: 18 BP: (!) 128/52 SpO2: 96 %  BMI: Estimated body mass index is 32.58 kg/m as calculated from the following:   Height as of this encounter: 5\' 7"  (1.702 m).   Weight as of this encounter: 208 lb (94.3 kg).  Risk Assessment: Allergies: Reviewed. She is allergic to  cephalexin, cephalosporins, prochlorperazine, shrimp extract, bee venom, latex, other, and silicone.  Allergy Precautions: None required Coagulopathies: Reviewed. None identified.  Blood-thinner therapy: None at this time Active Infection(s): Reviewed. None identified. Sandy Byrd is afebrile  Site Confirmation: Sandy Byrd was asked to confirm the procedure and  laterality before marking the site Procedure checklist: Completed Consent: Before the procedure and under the influence of no sedative(s), amnesic(s), or anxiolytics, the patient was informed of the treatment options, risks and possible complications. To fulfill our ethical and legal obligations, as recommended by the American Medical Association's Code of Ethics, I have informed the patient of my clinical impression; the nature and purpose of the treatment or procedure; the risks, benefits, and possible complications of the intervention; the alternatives, including doing nothing; the risk(s) and benefit(s) of the alternative treatment(s) or procedure(s); and the risk(s) and benefit(s) of doing nothing. The patient was provided information about the general risks and possible complications associated with the procedure. These may include, but are not limited to: failure to achieve desired goals, infection, bleeding, organ or nerve damage, allergic reactions, paralysis, and death. In addition, the patient was informed of those risks and complications associated to Spine-related procedures, such as failure to decrease pain; infection (i.e.: Meningitis, epidural or intraspinal abscess); bleeding (i.e.: epidural hematoma, subarachnoid hemorrhage, or any other type of intraspinal or peri-dural bleeding); organ or nerve damage (i.e.: Any type of peripheral nerve, nerve root, or spinal cord injury) with subsequent damage to sensory, motor, and/or autonomic systems, resulting in permanent pain, numbness, and/or weakness of one or several areas of the  body; allergic reactions; (i.e.: anaphylactic reaction); and/or death. Furthermore, the patient was informed of those risks and complications associated with the medications. These include, but are not limited to: allergic reactions (i.e.: anaphylactic or anaphylactoid reaction(s)); adrenal axis suppression; blood sugar elevation that in diabetics may result in ketoacidosis or comma; water retention that in patients with history of congestive heart failure may result in shortness of breath, pulmonary edema, and decompensation with resultant heart failure; weight gain; swelling or edema; medication-induced neural toxicity; particulate matter embolism and blood vessel occlusion with resultant organ, and/or nervous system infarction; and/or aseptic necrosis of one or more joints. Finally, the patient was informed that Medicine is not an exact science; therefore, there is also the possibility of unforeseen or unpredictable risks and/or possible complications that may result in a catastrophic outcome. The patient indicated having understood very clearly. We have given the patient no guarantees and we have made no promises. Enough time was given to the patient to ask questions, all of which were answered to the patient's satisfaction. Ms. Zeeman has indicated that she wanted to continue with the procedure. Attestation: I, the ordering provider, attest that I have discussed with the patient the benefits, risks, side-effects, alternatives, likelihood of achieving goals, and potential problems during recovery for the procedure that I have provided informed consent. Date  Time: 07/31/2023 12:48 PM  Pre-Procedure Preparation:  Monitoring: As per clinic protocol. Respiration, ETCO2, SpO2, BP, heart rate and rhythm monitor placed and checked for adequate function Safety Precautions: Patient was assessed for positional comfort and pressure points before starting the procedure. Time-out: I initiated and conducted the  "Time-out" before starting the procedure, as per protocol. The patient was asked to participate by confirming the accuracy of the "Time Out" information. Verification of the correct person, site, and procedure were performed and confirmed by me, the nursing staff, and the patient. "Time-out" conducted as per Joint Commission's Universal Protocol (UP.01.01.01). Time: 1324  Description of Procedure:          Laterality: Right Levels:  L3, L4, L5,Medial Branch Level(s), at the L3-4 and L5-S1 lumbar facet joints. Safety Precautions: Aspiration looking for blood return was conducted prior to all injections. At  no point did we inject any substances, as a needle was being advanced. Before injecting, the patient was told to immediately notify me if she was experiencing any new onset of "ringing in the ears, or metallic taste in the mouth". No attempts were made at seeking any paresthesias. Safe injection practices and needle disposal techniques used. Medications properly checked for expiration dates. SDV (single dose vial) medications used. After the completion of the procedure, all disposable equipment used was discarded in the proper designated medical waste containers. Local Anesthesia: Protocol guidelines were followed. The patient was positioned over the fluoroscopy table. The area was prepped in the usual manner. The time-out was completed. The target area was identified using fluoroscopy. A 12-in long, straight, sterile hemostat was used with fluoroscopic guidance to locate the targets for each level blocked. Once located, the skin was marked with an approved surgical skin marker. Once all sites were marked, the skin (epidermis, dermis, and hypodermis), as well as deeper tissues (fat, connective tissue and muscle) were infiltrated with a small amount of a short-acting local anesthetic, loaded on a 10cc syringe with a 25G, 1.5-in  Needle. An appropriate amount of time was allowed for local anesthetics to take  effect before proceeding to the next step. Technical description of process:  Radiofrequency Ablation (RFA) L3 Medial Branch Nerve RFA: The target area for the L3 medial branch is at the junction of the postero-lateral aspect of the superior articular process and the superior, posterior, and medial edge of the transverse process of L4. Under fluoroscopic guidance, a Radiofrequency needle was inserted until contact was made with os over the superior postero-lateral aspect of the pedicular shadow (target area). Sensory and motor testing was conducted to properly adjust the position of the needle. Once satisfactory placement of the needle was achieved, the numbing solution was slowly injected after negative aspiration for blood. 2.0 mL of the nerve block solution was injected without difficulty or complication. After waiting for at least 3 minutes, the ablation was performed. Once completed, the needle was removed intact. L4 Medial Branch Nerve RFA: The target area for the L4 medial branch is at the junction of the postero-lateral aspect of the superior articular process and the superior, posterior, and medial edge of the transverse process of L5. Under fluoroscopic guidance, a Radiofrequency needle was inserted until contact was made with os over the superior postero-lateral aspect of the pedicular shadow (target area). Sensory and motor testing was conducted to properly adjust the position of the needle. Once satisfactory placement of the needle was achieved, the numbing solution was slowly injected after negative aspiration for blood. 2.0 mL of the nerve block solution was injected without difficulty or complication. After waiting for at least 3 minutes, the ablation was performed. Once completed, the needle was removed intact. L5 Medial Branch Nerve RFA: The target area for the L5 medial branch is at the junction of the postero-lateral aspect of the superior articular process of S1 and the superior, posterior,  and medial edge of the sacral ala. Under fluoroscopic guidance, a Radiofrequency needle was inserted until contact was made with os over the superior postero-lateral aspect of the pedicular shadow (target area). Sensory and motor testing was conducted to properly adjust the position of the needle. Once satisfactory placement of the needle was achieved, the numbing solution was slowly injected after negative aspiration for blood. 2.0 mL of the nerve block solution was injected without difficulty or complication. After waiting for at least 3 minutes, the ablation was  performed. Once completed, the needle was removed intact. Radiofrequency lesioning (ablation):  Radiofrequency Generator: Medtronic AccurianTM AG 1000 RF Generator Sensory Stimulation Parameters: 50 Hz was used to locate & identify the nerve, making sure that the needle was positioned such that there was no sensory stimulation below 0.3 V or above 0.7 V. Motor Stimulation Parameters: 2 Hz was used to evaluate the motor component. Care was taken not to lesion any nerves that demonstrated motor stimulation of the lower extremities at an output of less than 2.5 times that of the sensory threshold, or a maximum of 2.0 V. Lesioning Technique Parameters: Standard Radiofrequency settings. (Not bipolar or pulsed.) Temperature Settings: 80 degrees C Lesioning time: 60 seconds Intra-operative Compliance: Compliant  6 cc solution made of 5 cc of 0.2% ropivacaine, 1 cc of Decadron 10 mg/cc.  2 cc injected at each level above on the right after sensorimotor testing, prior to lesioning.  Once the entire procedure was completed, the treated area was cleaned, making sure to leave some of the prepping solution back to take advantage of its long term bactericidal properties.    Illustration of the posterior view of the lumbar spine and the posterior neural structures. Laminae of L2 through S1 are labeled. DPRL5, dorsal primary ramus of L5; DPRS1, dorsal  primary ramus of S1; DPR3, dorsal primary ramus of L3; FJ, facet (zygapophyseal) joint L3-L4; I, inferior articular process of L4; LB1, lateral branch of dorsal primary ramus of L1; IAB, inferior articular branches from L3 medial branch (supplies L4-L5 facet joint); IBP, intermediate branch plexus; MB3, medial branch of dorsal primary ramus of L3; NR3, third lumbar nerve root; S, superior articular process of L5; SAB, superior articular branches from L4 (supplies L4-5 facet joint also); TP3, transverse process of L3.  Vitals:   07/31/23 1321 07/31/23 1326 07/31/23 1331 07/31/23 1336  BP: 134/64 (!) 140/69 135/67 (!) 142/70  Pulse:      Resp: 18 17 18 17   Temp:      TempSrc:      SpO2: 98% 97% 96% 98%  Weight:      Height:       Start Time: 1324 hrs. End Time: 1336 hrs.  Imaging Guidance (Spinal):          Type of Imaging Technique: Fluoroscopy Guidance (Spinal) Indication(s): Assistance in needle guidance and placement for procedures requiring needle placement in or near specific anatomical locations not easily accessible without such assistance. Exposure Time: Please see nurses notes. Contrast: None used. Fluoroscopic Guidance: I was personally present during the use of fluoroscopy. "Tunnel Vision Technique" used to obtain the best possible view of the target area. Parallax error corrected before commencing the procedure. "Direction-depth-direction" technique used to introduce the needle under continuous pulsed fluoroscopy. Once target was reached, antero-posterior, oblique, and lateral fluoroscopic projection used confirm needle placement in all planes. Images permanently stored in EMR. Interpretation: No contrast injected. I personally interpreted the imaging intraoperatively. Adequate needle placement confirmed in multiple planes. Permanent images saved into the patient's record.  Post-operative Assessment:  Post-procedure Vital Signs:  Pulse/HCG Rate: 64(!) 59 Temp:  (!) 97.3 F  (36.3 C) Resp: 17 BP: (!) 142/70 SpO2: 98 %  EBL: None  Complications: No immediate post-treatment complications observed by team, or reported by patient.  Note: The patient tolerated the entire procedure well. A repeat set of vitals were taken after the procedure and the patient was kept under observation following institutional policy, for this type of procedure. Post-procedural neurological assessment was performed,  showing return to baseline, prior to discharge. The patient was provided with post-procedure discharge instructions, including a section on how to identify potential problems. Should any problems arise concerning this procedure, the patient was given instructions to immediately contact us, at any time, without hesitation. In any case, we plan to contact the patient by telephone for a follow-up status report regarding this interventional procedure.  Comments:  No additional relevant information.  5 out of 5 strength bilateral lower extremity: Plantar flexion, dorsiflexion, knee flexion, knee extension.   Plan of Care  Orders:  Orders Placed This Encounter  Procedures   DG [facility] C-ARM 1-60 MIN NO REPORT    Intraoperative interpretation by procedural physician at [facility].    Standing Status:   Standing    Number of Occurrences:   1    Order Specific Question:   Reason for exam:    Answer:   Assistance in needle guidance and placement for procedures requiring needle placement in or near specific anatomical locations not easily accessible without such assistance.   Medications ordered for procedure: Meds ordered this encounter  Medications   pregabalin (LYRICA) 100 MG capsule    Sig: Take 1 capsule (100 mg total) by mouth 3 (three) times daily.    Dispense:  90 capsule    Refill:  2    Fill one day early if pharmacy is closed on scheduled refill date. May substitute for generic if available.   lidocaine (XYLOCAINE) 2 % (with pres) injection 400 mg    diazepam (VALIUM) tablet 5 mg    Make sure Flumazenil is available in the pyxis when using this medication. If oversedation occurs, administer 0.2 mg IV over 15 sec. If after 45 sec no response, administer 0.2 mg again over 1 min; may repeat at 1 min intervals; not to exceed 4 doses (1 mg)   dexamethasone (DECADRON) injection 10 mg   ropivacaine (PF) 2 mg/mL (0.2%) (NAROPIN) injection 9 mL  D/C Gabapentin  Medications administered: We administered lidocaine, diazepam, dexamethasone, and ropivacaine (PF) 2 mg/mL (0.2%).  See the medical record for exact dosing, route, and time of administration.  Follow-up plan:   Return for Keep sch. appt.       R L3,4,5 RFA 02/26/22, 08/29/22, 07/31/23  Recent Visits Date Type Provider Dept  07/02/23 Office Visit Edward Jolly, MD [facility]  Showing recent visits within past 90 days and meeting all other requirements Today's Visits Date Type Provider Dept  07/31/23 Procedure visit Edward Jolly, MD [facility]  Showing today's visits and meeting all other requirements Future Appointments Date Type Provider Dept  10/01/23 Appointment Edward Jolly, MD [facility]  Showing future appointments within next 90 days and meeting all other requirements  Disposition: Discharge home  Discharge (Date  Time): 07/31/2023; 1346 hrs.   Primary Care Physician: Enid Baas, MD Location: [facility] Note by: Edward Jolly, MD Date: 07/31/2023; Time: 1:47 PM  Disclaimer:  Medicine is not an exact science. The only guarantee in medicine is that nothing is guaranteed. It is important to note that the decision to proceed with this intervention was based on the information collected from the patient. The Data and conclusions were drawn from the patient's questionnaire, the interview, and the physical examination. Because the information was provided in large part by the patient, it cannot be guaranteed that  it has not been purposely or unconsciously manipulated. Every effort has been made to obtain as much  relevant data as possible for this evaluation. It is important to note that the conclusions that lead to this procedure are derived in large part from the available data. Always take into account that the treatment will also be dependent on availability of resources and existing treatment guidelines, considered by other Pain Management Practitioners as being common knowledge and practice, at the time of the intervention. For Medico-Legal purposes, it is also important to point out that variation in procedural techniques and pharmacological choices are the acceptable norm. The indications, contraindications, technique, and results of the above procedure should only be interpreted and judged by a Board-Certified Interventional Pain Specialist with extensive familiarity and expertise in the same exact procedure and technique.

## 2023-07-31 NOTE — Patient Instructions (Signed)

## 2023-08-01 ENCOUNTER — Telehealth: Payer: Self-pay | Admitting: *Deleted

## 2023-08-01 NOTE — Telephone Encounter (Signed)
Post procedure call:   no  questions or concerns.  

## 2023-08-27 ENCOUNTER — Other Ambulatory Visit: Payer: Self-pay | Admitting: Internal Medicine

## 2023-08-27 DIAGNOSIS — Z1231 Encounter for screening mammogram for malignant neoplasm of breast: Secondary | ICD-10-CM

## 2023-08-28 ENCOUNTER — Ambulatory Visit
Admission: RE | Admit: 2023-08-28 | Discharge: 2023-08-28 | Disposition: A | Discharge status: Home / Self Care | Payer: Medicare Other | Source: Ambulatory Visit | Attending: Internal Medicine | Admitting: Internal Medicine

## 2023-08-28 DIAGNOSIS — Z1231 Encounter for screening mammogram for malignant neoplasm of breast: Secondary | ICD-10-CM | POA: Diagnosis present

## 2023-09-06 ENCOUNTER — Other Ambulatory Visit: Payer: Self-pay | Admitting: Internal Medicine

## 2023-09-06 DIAGNOSIS — M7989 Other specified soft tissue disorders: Secondary | ICD-10-CM

## 2023-09-09 ENCOUNTER — Ambulatory Visit
Admission: RE | Admit: 2023-09-09 | Discharge: 2023-09-09 | Disposition: A | Discharge status: Home / Self Care | Payer: Medicare Other | Source: Ambulatory Visit | Attending: Internal Medicine | Admitting: Internal Medicine

## 2023-09-09 DIAGNOSIS — M7989 Other specified soft tissue disorders: Secondary | ICD-10-CM | POA: Insufficient documentation

## 2023-10-01 ENCOUNTER — Ambulatory Visit
Payer: Medicare Other | Attending: Student in an Organized Health Care Education/Training Program | Admitting: Student in an Organized Health Care Education/Training Program

## 2023-10-01 ENCOUNTER — Encounter: Payer: Self-pay | Admitting: Student in an Organized Health Care Education/Training Program

## 2023-10-01 VITALS — BP 150/78 | HR 63 | Temp 97.0°F | Resp 16 | Ht 67.0 in | Wt 208.0 lb

## 2023-10-01 DIAGNOSIS — M1711 Unilateral primary osteoarthritis, right knee: Secondary | ICD-10-CM | POA: Diagnosis present

## 2023-10-01 DIAGNOSIS — M25561 Pain in right knee: Secondary | ICD-10-CM | POA: Diagnosis present

## 2023-10-01 DIAGNOSIS — G5701 Lesion of sciatic nerve, right lower limb: Secondary | ICD-10-CM | POA: Insufficient documentation

## 2023-10-01 DIAGNOSIS — S83203S Other tear of unspecified meniscus, current injury, right knee, sequela: Secondary | ICD-10-CM | POA: Diagnosis present

## 2023-10-01 DIAGNOSIS — M47816 Spondylosis without myelopathy or radiculopathy, lumbar region: Secondary | ICD-10-CM | POA: Insufficient documentation

## 2023-10-01 DIAGNOSIS — G8929 Other chronic pain: Secondary | ICD-10-CM | POA: Insufficient documentation

## 2023-10-01 DIAGNOSIS — M533 Sacrococcygeal disorders, not elsewhere classified: Secondary | ICD-10-CM | POA: Insufficient documentation

## 2023-10-01 DIAGNOSIS — M461 Sacroiliitis, not elsewhere classified: Secondary | ICD-10-CM | POA: Insufficient documentation

## 2023-10-01 DIAGNOSIS — G894 Chronic pain syndrome: Secondary | ICD-10-CM | POA: Insufficient documentation

## 2023-10-01 MED ORDER — HYDROCODONE-ACETAMINOPHEN 7.5-325 MG PO TABS
1.0000 | ORAL_TABLET | Freq: Two times a day (BID) | ORAL | 0 refills | Status: AC | PRN
Start: 2023-11-09 — End: 2023-12-09

## 2023-10-01 MED ORDER — HYDROCODONE-ACETAMINOPHEN 7.5-325 MG PO TABS
1.0000 | ORAL_TABLET | Freq: Two times a day (BID) | ORAL | 0 refills | Status: AC | PRN
Start: 2023-10-10 — End: 2023-11-09

## 2023-10-01 MED ORDER — HYDROCODONE-ACETAMINOPHEN 7.5-325 MG PO TABS
1.0000 | ORAL_TABLET | Freq: Two times a day (BID) | ORAL | 0 refills | Status: DC | PRN
Start: 2023-12-09 — End: 2023-12-18

## 2023-10-01 MED ORDER — PREGABALIN 100 MG PO CAPS
100.0000 mg | ORAL_CAPSULE | Freq: Three times a day (TID) | ORAL | 5 refills | Status: DC
Start: 2023-10-01 — End: 2024-04-07

## 2023-10-01 NOTE — Progress Notes (Signed)
Nursing Pain Medication Assessment:  Safety precautions to be maintained throughout the outpatient stay will include: orient to surroundings, keep bed in low position, maintain call bell within reach at all times, provide assistance with transfer out of bed and ambulation.   Medication Inspection Compliance: Ms. Tome did not comply with our request to bring her pills to be counted. She was reminded that bringing the medication bottles, even when empty, is a requirement.  Medication: None brought in. Pill/Patch Count: None available to be counted. Bottle Appearance: No container available. Did not bring bottle(s) to appointment. Filled Date: N/A Last Medication intake:  Yesterday

## 2023-10-01 NOTE — Progress Notes (Signed)
PROVIDER NOTE: Information contained herein reflects review and annotations entered in association with encounter. Interpretation of such information and data should be left to medically-trained personnel. Information provided to patient can be located elsewhere in the medical record under "Patient Instructions". Document created using STT-dictation technology, any transcriptional errors that may result from process are unintentional.    Patient: Sandy Byrd  Service Category: E/M  Provider: Edward Jolly, MD  DOB: April 21, 1955  DOS: 10/01/2023  Referring Provider: Enid Baas, MD  MRN: [id number]  Specialty: Interventional Pain Management  PCP: Enid Baas, MD  Type: Established Patient  Setting: Ambulatory outpatient    Location: Office  Delivery: Face-to-face     HPI  Sandy Byrd, a 68 y.o. 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 Sandy Byrd primary complain today is Back Pain  Pertinent problems: Sandy Byrd has Lumbar spondylosis; Lumbar degenerative disc disease; Chronic pain syndrome; and H/O rotator cuff surgery (bilateral) on their pertinent problem list. Pain Assessment: Severity of Chronic pain is reported as a 7 /10. Location: Back Lower, Right/Radaites from lower right back to right knee. Onset: More than a month ago. Quality: Constant, Burning, Sharp, Stabbing. Timing: Constant. Modifying factor(s): RFA, resting, ice, heat, sleeping with pillow between knees, and pain medication. Vitals:  height is 5\' 7"  (1.702 m) and weight is 208 lb (94.3 kg). Her temporal temperature is 97 F (36.1 C) (abnormal). Her blood pressure is 150/78 (abnormal) and her pulse is 63. Her respiration is 16 and oxygen saturation is 96%.  BMI: Estimated body mass index is 32.58 kg/m as calculated from the following:   Height as of this encounter: 5\' 7"  (1.702 m).   Weight as of this encounter: 208 lb (94.3 kg). Last encounter:  07/02/2023. Last procedure: 07/31/2023.  Reason for encounter: both, medication management and post-procedure evaluation and assessment.    Excellent response from right lumbar RFA as below Patient is endorsing increased right SI joint pain and buttock pain.  See physical exam findings below She is also endorsing increased right knee pain related to knee osteoarthritis.  Her previous knee steroid injection was May 01, 2023 that provided her with 75% pain relief for 4-1/2 months.  Given return of pain, she would like to repeat right knee steroid injection. Medication management as below  Post-procedure evaluation   Type: Lumbar Facet, Medial Branch Radiofrequency Ablation (RFA) #3  Laterality: Right  Level: L3, L4, L5, Medial Branch Level(s). These levels will denervate the L3-4 and L5-S1 lumbar facet joints.  Imaging: Fluoroscopic guidance Anesthesia: Local anesthesia (1-2% Lidocaine) Anxiolysis: Oral Valium 5 mg PO Sedation:  Minimal . DOS: 07/31/2023  Performed by: Edward Jolly, MD  Purpose: Therapeutic/Palliative Indications: Low back pain severe enough to impact quality of life or function. Indications: 1. Lumbar facet joint syndrome   2. Chronic pain syndrome    Sandy Byrd has been dealing with the above chronic pain for longer than three months and has either failed to respond, was unable to tolerate, or simply did not get enough benefit from other more conservative therapies including, but not limited to: 1. Over-the-counter medications 2. Anti-inflammatory medications 3. Muscle relaxants 4. Membrane stabilizers 5. Opioids 6. Physical therapy and/or chiropractic manipulation 7. Modalities (Heat, ice, etc.) 8. Invasive techniques such as nerve blocks. Sandy Byrd has attained more than 50% relief of the pain from a series of diagnostic injections conducted in separate occasions.  Pain Score: Pre-procedure: 8 /10 Post-procedure: 8 /10  Effectiveness:  Initial  hour after procedure: 100 %  Subsequent 4-6 hours post-procedure: 100 %  Analgesia past initial 6 hours: 80 %  Ongoing improvement:  Analgesic:  80% Function: Sandy Byrd reports improvement in function ROM: [name] reports improvement in ROM   Pharmacotherapy Assessment  Analgesic: Norco 7.5 mg BID  prn #60 per month   Monitoring: [place] PMP: PDMP reviewed during this encounter.       Pharmacotherapy: No side-effects or adverse reactions reported. Compliance: No problems identified. Effectiveness: Clinically acceptable.  Earlyne Iba, RN  10/01/2023  9:53 AM  Sign when Signing Visit Nursing Pain Medication Assessment:  Safety precautions to be maintained throughout the outpatient stay will include: orient to surroundings, keep bed in low position, maintain call bell within reach at all times, provide assistance with transfer out of bed and ambulation.   Medication Inspection Compliance: Sandy Byrd did not comply with our request to bring her pills to be counted. She was reminded that bringing the medication bottles, even when empty, is a requirement.  Medication: None brought in. Pill/Patch Count: None available to be counted. Bottle Appearance: No container available. Did not bring bottle(s) to appointment. Filled Date: N/A Last Medication intake:  Yesterday    No results found for: "CBDTHCR" No results found for: "D8THCCBX" No results found for: "D9THCCBX"  UDS:  Summary  Date Value Ref Range Status  09/18/2022 Note  Final    Comment:    ==================================================================== ToxAS[employer] Select 13 (MW) ==================================================================== Test                             Result       Flag       Units  Drug Present and Declared for Prescription Verification   Norhydrocodone                 119          EXPECTED   ng/mg creat    Norhydrocodone is an expected metabolite of hydrocodone.  Drug Present  not Declared for Prescription Verification   Oxazepam                       27           UNEXPECTED ng/mg creat    Oxazepam may be administered as a scheduled prescription medication;    it is also an expected metabolite of other benzodiazepine drugs,    including diazepam, chlordiazepoxide, prazepam, clorazepate,    halazepam, and temazepam.  Drug Absent but Declared for Prescription Verification   Hydrocodone                    Not Detected UNEXPECTED ng/mg creat    Hydrocodone is almost always present in patients taking this drug    consistently. Absence of hydrocodone could be due to lapse of time    since the last dose or unusual pharmacokinetics (rapid metabolism).  ==================================================================== Test                      Result    Flag   Units      Ref Range   Creatinine              73               mg/dL      >=14 ==================================================================== Declared Medications:  The flagging and interpretation on this  report are based on the  following declared medications.  Unexpected results may arise from  inaccuracies in the declared medications.   **Note: The testing scope of this panel includes these medications:   Hydrocodone (Norco)   **Note: The testing scope of this panel does not include the  following reported medications:   Acetaminophen (Tylenol)  Acetaminophen (Norco)  Albuterol (Ventolin HFA)  Aspirin  Celecoxib (Celebrex)  Cyanocobalamin  Diclofenac (Voltaren)  Epinephrine (EpiPen)  Escitalopram (Lexapro)  Eye Drop  Fluticasone (Flonase)  Folic Acid  Gabapentin (Neurontin)  Hydrochlorothiazide (Dyazide)  Ketoconazole (Nizoral)  Levothyroxine (Synthroid)  Methocarbamol (Robaxin)  Metronidazole (MetroGel)  Minocycline (Minocin)  Polyethylene Glycol  Simvastatin (Zocor)  Topical Lidocaine (Lidoderm)  Trazodone (Desyrel)  Triamcinolone (Kenalog)  Triamterene (Dyazide)  Vitamin  D2 ==================================================================== For clinical consultation, please call [phone]. ====================================================================       ROS  Constitutional: Denies any fever or chills Gastrointestinal: No reported hemesis, hematochezia, vomiting, or acute GI distress Musculoskeletal:  Right buttock pain, right SI joint pain, right knee pain Neurological: No reported episodes of acute onset apraxia, aphasia, dysarthria, agnosia, amnesia, paralysis, loss of coordination, or loss of consciousness  Medication Review  EPINEPHrine, HYDROcodone-acetaminophen, Magnesium, Turmeric, Vitamin D (Ergocalciferol), acetaminophen, albuterol, ascorbic acid, baclofen, benzonatate, celecoxib, cycloSPORINE, ergocalciferol, escitalopram, fluticasone, folic acid, furosemide, ketoconazole, levothyroxine, lidocaine, metroNIDAZOLE, potassium chloride SA, pregabalin, simvastatin, triamcinolone cream, triamterene-hydrochlorothiazide, and zolpidem  History Review  Allergy: Sandy Byrd is allergic to cephalexin, cephalosporins, prochlorperazine, shrimp extract, bee venom, latex, other, and silicone. Drug: Sandy Byrd  reports no history of drug use. Alcohol:  reports current alcohol use. Tobacco:  reports that she quit smoking about 21 years ago. Her smoking use included cigarettes. She has never been exposed to tobacco smoke. She has never used smokeless tobacco. Social: Sandy Byrd  reports that she quit smoking about 21 years ago. Her smoking use included cigarettes. She has never been exposed to tobacco smoke. She has never used smokeless tobacco. She reports current alcohol use. She reports that she does not use drugs. Medical:  has a past medical history of Anxiety, Arthritis, Depression, Hypertension, Rosacea, and Thyroid disease. Surgical: Sandy Byrd  has a past surgical history that includes Shoulder surgery (Right, 2016) and Shoulder  surgery (Right, 2017). Family: She was adopted. Family history is unknown by patient.  Laboratory Chemistry Profile   Renal No results found for: "BUN", "CREATININE", "LABCREA", "BCR", "GFR", "GFRAA", "GFRNONAA", "LABVMA", "EPIRU", "EPINEPH24HUR", "NOREPRU", "NOREPI24HUR", "DOPARU", "DOPAM24HRUR"  Hepatic No results found for: "AST", "ALT", "ALBUMIN", "ALKPHOS", "HCVAB", "AMYLASE", "LIPASE", "AMMONIA"  Electrolytes No results found for: "NA", "K", "CL", "CALCIUM", "MG", "PHOS"  Bone No results found for: "VD25OH", "VD125OH2TOT", "NG2952WU1", "LK4401UU7", "25OHVITD1", "25OHVITD2", "25OHVITD3", "TESTOFREE", "TESTOSTERONE"  Inflammation (CRP: Acute Phase) (ESR: Chronic Phase) No results found for: "CRP", "ESRSEDRATE", "LATICACIDVEN"       Note: Above Lab results reviewed.  Recent Imaging Review  US Venous Img Lower Bilateral (DVT) CLINICAL DATA:  bilateral leg swelling r/o dvt  EXAM: BILATERAL LOWER EXTREMITY VENOUS DOPPLER ULTRASOUND  TECHNIQUE: Gray-scale sonography with graded compression, as well as color Doppler and duplex ultrasound were performed to evaluate the lower extremity deep venous systems from the level of the common femoral vein and including the common femoral, femoral, profunda femoral, popliteal and calf veins including the posterior tibial, peroneal and gastrocnemius veins when visible. The superficial great saphenous vein was also interrogated. Spectral Doppler was utilized to evaluate flow at rest and with distal augmentation maneuvers in the common femoral, femoral and popliteal  veins.  COMPARISON:  Ultrasound, 12/13/2022  FINDINGS: RIGHT LOWER EXTREMITY  VENOUS  Normal compressibility of the RIGHT common femoral, superficial femoral, and popliteal veins, as well as the visualized calf veins. Visualized portions of profunda femoral vein and great saphenous vein unremarkable. No filling defects to suggest DVT on grayscale or color Doppler imaging.  Doppler waveforms show normal direction of venous flow, normal respiratory plasticity and response to augmentation.  OTHER  No evidence of superficial thrombophlebitis or abnormal fluid collection.  Limitations: none  LEFT LOWER EXTREMITY  VENOUS  Normal compressibility of the LEFT common femoral, superficial femoral, and popliteal veins, as well as the visualized calf veins. Visualized portions of profunda femoral vein and great saphenous vein unremarkable. No filling defects to suggest DVT on grayscale or color Doppler imaging. Doppler waveforms show normal direction of venous flow, normal respiratory plasticity and response to augmentation.  OTHER  No evidence of superficial thrombophlebitis or abnormal fluid collection.  Limitations: none  IMPRESSION: No evidence of femoropopliteal DVT or superficial thrombophlebitis within either lower extremity.  Roanna Banning, MD  [facility]  [facility]  Electronically Signed   By: Roanna Banning M.D.   On: 09/10/2023 08:35 Note: Reviewed        Physical Exam  General appearance: Well nourished, well developed, and well hydrated. In no apparent acute distress Mental status: Alert, oriented x 3 (person, place, & time)       Respiratory: No evidence of acute respiratory distress Eyes: PERLA Vitals: BP (!) 150/78   Pulse 63   Temp (!) 97 F (36.1 C) (Temporal)   Resp 16   Ht 5\' 7"  (1.702 m)   Wt 208 lb (94.3 kg)   SpO2 96%   BMI 32.58 kg/m  BMI: Estimated body mass index is 32.58 kg/m as calculated from the following:   Height as of this encounter: 5\' 7"  (1.702 m).   Weight as of this encounter: 208 lb (94.3 kg). Ideal: Ideal body weight: 61.6 kg (135 lb 12.9 oz) Adjusted ideal body weight: 74.7 kg (164 lb 10.9 oz)  Lumbar Spine Area Exam  Skin & Axial Inspection: No masses, redness, or swelling Alignment: Symmetrical Functional ROM: Pain restricted ROM affecting  primarily the right Stability: No instability detected Muscle Tone/Strength: Functionally intact. No obvious neuro-muscular anomalies detected. Sensory (Neurological): Improved Palpation: No palpable anomalies       Provocative Tests: Hyperextension/rotation test: Improved after treatment       Lumbar quadrant test (Kemp's test): Improved after treatment       Lateral bending test: Improved after treatment       Patrick's Maneuver: (+) for right-sided S-I arthralgia  FABER* test: (+) for right-sided S-I arthralgia  S-I anterior distraction/compression test: (+) for right-sided S-I arthralgia  S-I lateral compression test:(+) for right-sided S-I arthralgia  S-I Thigh-thrust test:(+) for right-sided S-I arthralgia  S-I Gaenslen's test: (+) for right-sided S-I arthralgia   Gait & Posture Assessment  Ambulation: Unassisted Gait: Relatively normal for age and body habitus Posture: WNL  Lower Extremity Exam    Side: Right lower extremity  Side: Left lower extremity  Stability: No instability observed          Stability: No instability observed          Skin & Extremity Inspection: Skin color, temperature, and hair growth are WNL. No peripheral edema or cyanosis. No masses, redness, swelling, asymmetry, or associated skin lesions. No contractures.  Skin & Extremity Inspection: Skin color, temperature,  and hair growth are WNL. No peripheral edema or cyanosis. No masses, redness, swelling, asymmetry, or associated skin lesions. No contractures.  Functional ROM: Pain restricted ROM for knee joint          Functional ROM: Unrestricted ROM                  Muscle Tone/Strength: Functionally intact. No obvious neuro-muscular anomalies detected.  Muscle Tone/Strength: Functionally intact. No obvious neuro-muscular anomalies detected.  Sensory (Neurological): Arthropathic arthralgia        Sensory (Neurological): Unimpaired        DTR: Patellar: deferred today Achilles: deferred today Plantar:  deferred today  DTR: Patellar: deferred today Achilles: deferred today Plantar: deferred today  Palpation: No palpable anomalies  Palpation: No palpable anomalies    Assessment   Diagnosis Status  1. SI joint arthritis (HCC)   2. Sacroiliac joint pain   3. Piriformis syndrome of right side   4. Lumbar facet joint syndrome   5. Lumbar facet arthropathy   6. Chronic pain syndrome   7. Complex tear of meniscus of right knee, unspecified meniscus, unspecified whether old or current tear, sequela   8. Chronic pain of right knee   9. Primary osteoarthritis of right knee    Having a Flare-up Having a Flare-up Having a Flare-up   Plan of Care   Sandy Byrd has a current medication list which includes the following long-term medication(s): escitalopram, fluticasone, furosemide, levothyroxine, potassium chloride sa, simvastatin, triamterene-hydrochlorothiazide, ventolin hfa, zolpidem, and pregabalin.  Pharmacotherapy (Medications Ordered): Meds ordered this encounter  Medications   HYDROcodone-acetaminophen (NORCO) 7.5-325 MG tablet    Sig: Take 1 tablet by mouth 2 (two) times daily as needed.    Dispense:  60 tablet    Refill:  0   HYDROcodone-acetaminophen (NORCO) 7.5-325 MG tablet    Sig: Take 1 tablet by mouth 2 (two) times daily as needed.    Dispense:  60 tablet    Refill:  0   HYDROcodone-acetaminophen (NORCO) 7.5-325 MG tablet    Sig: Take 1 tablet by mouth 2 (two) times daily as needed.    Dispense:  60 tablet    Refill:  0   pregabalin (LYRICA) 100 MG capsule    Sig: Take 1 capsule (100 mg total) by mouth 3 (three) times daily.    Dispense:  90 capsule    Refill:  5    Fill one day early if pharmacy is closed on scheduled refill date. May substitute for generic if available.   Orders:  Orders Placed This Encounter  Procedures   KNEE INJECTION    Local Anesthetic & Steroid injection.    Standing Status:   Future    Standing Expiration Date:   01/01/2024     Scheduling Instructions:     Side: RIGHT     Timeframe: As soon as schedule allows    Order Specific Question:   Where will this procedure be performed?    Answer:   [facility]   SACROILIAC JOINT INJECTION    Standing Status:   Future    Standing Expiration Date:   01/01/2024    Scheduling Instructions:     Side: RIGHT     Sedation: PO Valium     Timeframe: ASAP    Order Specific Question:   Where will this procedure be performed?    Answer:   [facility]   TRIGGER POINT INJECTION    Area: Buttocks region (gluteal  area) Indications: Piriformis muscle pain; Right (G57.01) piriformis-syndrome; piriformis muscle spasms (K44.010). CPT code: 27253    Scheduling Instructions:     RIGHT piriformis TPI    Order Specific Question:   Where will this procedure be performed?    Answer:   [facility]   ToxASSURE Select 13 (MW), Urine    Volume: 30 ml(s). Minimum 3 ml of urine is needed. Document temperature of fresh sample. Indications: Long term (current) use of opiate analgesic [phone])    Order Specific Question:   Release to patient    Answer:   Immediate   Follow-up plan:   Return in about 1 week (around 10/08/2023) for Right SI-J, piriformis, and right knee steroid, in clinic (PO Valium).      Recent Visits Date Type Provider Dept  07/31/23 Procedure visit Edward Jolly, MD [facility]  Showing recent visits within past 90 days and meeting all other requirements Today's Visits Date Type Provider Dept  10/01/23 Office Visit Edward Jolly, MD [facility]  Showing today's visits and meeting all other requirements Future Appointments Date Type Provider Dept  10/09/23 Appointment Edward Jolly, MD [facility]  12/19/23 Appointment Edward Jolly, MD [facility]  Showing future appointments within next 90 days and meeting all other requirements  I discussed the assessment and treatment plan with the patient. The  patient was provided an opportunity to ask questions and all were answered. The patient agreed with the plan and demonstrated an understanding of the instructions.  Patient advised to call back or seek an in-person evaluation if the symptoms or condition worsens.  Duration of encounter: .  Total time on encounter, as per AMA guidelines included both the face-to-face and non-face-to-face time personally spent by the physician and/or other qualified health care professional(s) on the day of the encounter (includes time in activities that require the physician or other qualified health care professional and does not include time in activities normally performed by clinical staff). Physician's time may include the following activities when performed: Preparing to see the patient (e.g., pre-charting review of records, searching for previously ordered imaging, lab work, and nerve conduction tests) Review of prior analgesic pharmacotherapies. Reviewing PMP Interpreting ordered tests (e.g., lab work, imaging, nerve conduction tests) Performing post-procedure evaluations, including interpretation of diagnostic procedures Obtaining and/or reviewing separately obtained history Performing a medically appropriate examination and/or evaluation Counseling and educating the patient/family/caregiver Ordering medications, tests, or procedures Referring and communicating with other health care professionals (when not separately reported) Documenting clinical information in the electronic or other health record Independently interpreting results (not separately reported) and communicating results to the patient/ family/caregiver Care coordination (not separately reported)  Note by: Edward Jolly, MD Date: 10/01/2023; Time: 1:01 PM

## 2023-10-01 NOTE — Patient Instructions (Signed)
Procedure instructions  Do not eat or drink fluids (other than water) for 6 hours before your procedure  No water for 2 hours before your procedure  Take your blood pressure medicine with a sip of water  Arrive 30 minutes before your appointment  Carefully read the "Preparing for your procedure" detailed instructions  If you have questions call us at [phone]  _____________________________________________________________________    ______________________________________________________________________  Preparing for your procedure  Appointments: If you think you may not be able to keep your appointment, call 24-48 hours in advance to cancel. We need time to make it available to others.  During your procedure appointment there will be: No Prescription Refills. No disability issues to discussed. No medication changes or discussions.  Instructions: Food intake: Avoid eating anything solid for at least 8 hours prior to your procedure. Clear liquid intake: You may take clear liquids such as water up to 2 hours prior to your procedure. (No carbonated drinks. No soda.) Transportation: Unless otherwise stated by your physician, bring a driver. Morning Medicines: Except for blood thinners, take all of your other morning medications with a sip of water. Make sure to take your heart and blood pressure medicines. If your blood pressure's lower number is above 100, the case will be rescheduled. Blood thinners: Make sure to stop your blood thinners as instructed.  If you take a blood thinner, but were not instructed to stop it, call our office [phone] and ask to talk to a nurse. Not stopping a blood thinner prior to certain procedures could lead to serious complications. Diabetics on insulin: Notify the staff so that you can be scheduled 1st case in the morning. If your diabetes requires high dose insulin, take only  of your normal insulin dose the morning of the procedure and  notify the staff that you have done so. Preventing infections: Shower with an antibacterial soap the morning of your procedure.  Build-up your immune system: Take 1000 mg of Vitamin C with every meal (3 times a day) the day prior to your procedure. Antibiotics: Inform the nursing staff if you are taking any antibiotics or if you have any conditions that may require antibiotics prior to procedures. (Example: recent joint implants)   Pregnancy: If you are pregnant make sure to notify the nursing staff. Not doing so may result in injury to the fetus, including death.  Sickness: If you have a cold, fever, or any active infections, call and cancel or reschedule your procedure. Receiving steroids while having an infection may result in complications. Arrival: You must be in the facility at least 30 minutes prior to your scheduled procedure. Tardiness: Your scheduled time is also the cutoff time. If you do not arrive at least 15 minutes prior to your procedure, you will be rescheduled.  Children: Do not bring any children with you. Make arrangements to keep them home. Dress appropriately: There is always a possibility that your clothing may get soiled. Avoid long dresses. Valuables: Do not bring any jewelry or valuables.  Reasons to call and reschedule or cancel your procedure: (Following these recommendations will minimize the risk of a serious complication.) Surgeries: Avoid having procedures within 2 weeks of any surgery. (Avoid for 2 weeks before or after any surgery). Flu Shots: Avoid having procedures within 2 weeks of a flu shots or . (Avoid for 2 weeks before or after immunizations). Barium: Avoid having a procedure within 7-10 days after having had a radiological study involving the use of radiological contrast. (  Myelograms, Barium swallow or enema study). Heart attacks: Avoid any elective procedures or surgeries for the initial 6 months after a "Myocardial Infarction" (Heart Attack). Blood  thinners: It is imperative that you stop these medications before procedures. Let us know if you if you take any blood thinner.  Infection: Avoid procedures during or within two weeks of an infection (including chest colds or gastrointestinal problems). Symptoms associated with infections include: Localized redness, fever, chills, night sweats or profuse sweating, burning sensation when voiding, cough, congestion, stuffiness, runny nose, sore throat, diarrhea, nausea, vomiting, cold or Flu symptoms, recent or current infections. It is specially important if the infection is over the area that we intend to treat. Heart and lung problems: Symptoms that may suggest an active cardiopulmonary problem include: cough, chest pain, breathing difficulties or shortness of breath, dizziness, ankle swelling, uncontrolled high or unusually low blood pressure, and/or palpitations. If you are experiencing any of these symptoms, cancel your procedure and contact your primary care physician for an evaluation.  Remember:  Regular Business hours are:  Monday to Thursday 8:00 AM to 4:00 PM  Provider's Schedule: Delano Metz, MD:  Procedure days: Tuesday and Thursday 7:30 AM to 4:00 PM  Edward Jolly, MD:  Procedure days: Monday and Wednesday 7:30 AM to 4:00 PM  Knee Injection A knee injection is a procedure to get medicine into your knee joint to relieve the pain, swelling, and stiffness of arthritis. Your health care provider uses a needle to inject medicine, which may also help to lubricate and cushion your knee joint. You may need more than one injection. Tell a health care provider about: Any allergies you have. All medicines you are taking, including vitamins, herbs, eye drops, creams, and over-the-counter medicines. Any problems you or family members have had with anesthetic medicines. Any blood disorders you have. Any surgeries you have had. Any medical conditions you have. Whether you are pregnant or  may be pregnant. What are the risks? Generally, this is a safe procedure. However, problems may occur, including: Infection. Bleeding. Symptoms that get worse. Damage to the area around your knee. Allergic reaction to any of the medicines. Skin reactions from repeated injections. What happens before the procedure? Ask your health care provider about: Changing or stopping your regular medicines. This is especially important if you are taking diabetes medicines or blood thinners. Taking medicines such as aspirin and ibuprofen. These medicines can thin your blood. Do not take these medicines unless your health care provider tells you to take them. Taking over-the-counter medicines, vitamins, herbs, and supplements. Plan to have a responsible adult take you home from the hospital or clinic. What happens during the procedure?  You will sit or lie down in a position for your knee to be treated. The skin over your kneecap will be cleaned with a germ-killing soap. You will be given a medicine that numbs the area (local anesthetic). You may feel some stinging. The medicine will be injected into your knee. The needle is carefully placed between your kneecap and your knee. The medicine is injected into the joint space. The needle will be removed at the end of the procedure. A bandage (dressing) may be placed over the injection site. The procedure may vary among health care providers and hospitals. What can I expect after the procedure? Your blood pressure, heart rate, breathing rate, and blood oxygen level will be monitored until you leave the hospital or clinic. You may have to move your knee through its full range of  motion. This helps to get all the medicine into your joint space. You will be watched to make sure that you do not have a reaction to the injected medicine. You may feel more pain, swelling, and warmth than you did before the injection. This reaction may last about 1-2 days. Follow  these instructions at home: Medicines Take over-the-counter and prescription medicines only as told by your health care provider. Ask your health care provider if the medicine prescribed to you requires you to avoid driving or using machinery. Do not take medicines such as aspirin and ibuprofen unless your health care provider tells you to take them. Injection site care Follow instructions from your health care provider about: How to take care of your puncture site. When and how you should change your dressing. When you should remove your dressing. Check your injection area every day for signs of infection. Check for: More redness, swelling, or pain after 2 days. Fluid or blood. Pus or a bad smell. Warmth. Managing pain, stiffness, and swelling  If directed, put ice on the injection area. To do this: Put ice in a plastic bag. Place a towel between your skin and the bag. Leave the ice on for 20 minutes, 2-3 times per day. Remove the ice if your skin turns bright red. This is very important. If you cannot feel pain, heat, or cold, you have a greater risk of damage to the area. Do not apply heat to your knee. Raise (elevate) the injection area above the level of your heart while you are sitting or lying down. General instructions If you were given a dressing, keep it dry until your health care provider says it can be removed. Ask your health care provider when you can start showering or bathing. Avoid strenuous activities for as long as directed by your health care provider. Ask your health care provider when you can return to your normal activities. Keep all follow-up visits. This is important. You may need more injections. Contact a health care provider if you have: A fever. Warmth in your injection area. Fluid, blood, or pus coming from your injection site. Symptoms at your injection site that last longer than 2 days after your procedure. Get help right away if: Your knee turns very  red. Your knee becomes very swollen. Your knee is in severe pain. Summary A knee injection is a procedure to get medicine into your knee joint to relieve the pain, swelling, and stiffness of arthritis. A needle is carefully placed between your kneecap and your knee to inject medicine into the joint space. Before the procedure, ask your health care provider about changing or stopping your regular medicines, especially if you are taking diabetes medicines or blood thinners. Contact your health care provider if you have any problems or questions after your procedure. This information is not intended to replace advice given to you by your health care provider. Make sure you discuss any questions you have with your health care provider. Document Revised: 06/01/2020 Document Reviewed: 06/01/2020 [employer] Patient Education  2024 [employer].

## 2023-10-03 LAB — TOXASSURE SELECT 13 (MW), URINE

## 2023-10-09 ENCOUNTER — Encounter: Payer: Self-pay | Admitting: Student in an Organized Health Care Education/Training Program

## 2023-10-09 ENCOUNTER — Ambulatory Visit
Admission: RE | Admit: 2023-10-09 | Discharge: 2023-10-09 | Disposition: A | Discharge status: Home / Self Care | Payer: Medicare Other | Source: Ambulatory Visit | Attending: Student in an Organized Health Care Education/Training Program | Admitting: Student in an Organized Health Care Education/Training Program

## 2023-10-09 ENCOUNTER — Ambulatory Visit
Payer: Medicare Other | Attending: Student in an Organized Health Care Education/Training Program | Admitting: Student in an Organized Health Care Education/Training Program

## 2023-10-09 VITALS — BP 128/80 | HR 69 | Temp 97.3°F | Resp 14 | Ht 67.0 in | Wt 208.0 lb

## 2023-10-09 DIAGNOSIS — M25561 Pain in right knee: Secondary | ICD-10-CM | POA: Insufficient documentation

## 2023-10-09 DIAGNOSIS — G5701 Lesion of sciatic nerve, right lower limb: Secondary | ICD-10-CM | POA: Insufficient documentation

## 2023-10-09 DIAGNOSIS — M1711 Unilateral primary osteoarthritis, right knee: Secondary | ICD-10-CM | POA: Insufficient documentation

## 2023-10-09 DIAGNOSIS — M533 Sacrococcygeal disorders, not elsewhere classified: Secondary | ICD-10-CM | POA: Insufficient documentation

## 2023-10-09 DIAGNOSIS — G8929 Other chronic pain: Secondary | ICD-10-CM | POA: Insufficient documentation

## 2023-10-09 DIAGNOSIS — X58XXXS Exposure to other specified factors, sequela: Secondary | ICD-10-CM | POA: Diagnosis not present

## 2023-10-09 DIAGNOSIS — S83203S Other tear of unspecified meniscus, current injury, right knee, sequela: Secondary | ICD-10-CM

## 2023-10-09 DIAGNOSIS — M461 Sacroiliitis, not elsewhere classified: Secondary | ICD-10-CM | POA: Diagnosis not present

## 2023-10-09 DIAGNOSIS — G894 Chronic pain syndrome: Secondary | ICD-10-CM

## 2023-10-09 DIAGNOSIS — S83206S Unspecified tear of unspecified meniscus, current injury, right knee, sequela: Secondary | ICD-10-CM | POA: Insufficient documentation

## 2023-10-09 MED ORDER — LIDOCAINE HCL 2 % IJ SOLN
20.0000 mL | Freq: Once | INTRAMUSCULAR | Status: AC
  Administered 2023-10-09: 400 mg
  Filled 2023-10-09: qty 20

## 2023-10-09 MED ORDER — DIAZEPAM 5 MG PO TABS
5.0000 mg | ORAL_TABLET | ORAL | Status: AC
  Administered 2023-10-09: 5 mg via ORAL
  Filled 2023-10-09: qty 1

## 2023-10-09 MED ORDER — LIDOCAINE 5 % EX PTCH: 1.0000 | MEDICATED_PATCH | Freq: Two times a day (BID) | CUTANEOUS | 11 refills | Status: DC

## 2023-10-09 MED ORDER — IOHEXOL 180 MG/ML  SOLN
INTRAMUSCULAR | Status: AC
  Filled 2023-10-09: qty 20

## 2023-10-09 MED ORDER — LIDOCAINE HCL 2 % IJ SOLN
INTRAMUSCULAR | Status: AC
  Filled 2023-10-09: qty 20

## 2023-10-09 MED ORDER — ROPIVACAINE HCL 2 MG/ML IJ SOLN
15.0000 mL | Freq: Once | INTRAMUSCULAR | Status: AC
  Administered 2023-10-09: 15 mL via PERINEURAL
  Filled 2023-10-09: qty 20

## 2023-10-09 MED ORDER — METHYLPREDNISOLONE ACETATE 40 MG/ML IJ SUSP
40.0000 mg | Freq: Once | INTRAMUSCULAR | Status: AC
  Administered 2023-10-09: 40 mg via INTRA_ARTICULAR
  Filled 2023-10-09: qty 1

## 2023-10-09 MED ORDER — METHYLPREDNISOLONE ACETATE 80 MG/ML IJ SUSP
80.0000 mg | Freq: Once | INTRAMUSCULAR | Status: AC
  Administered 2023-10-09: 80 mg via INTRA_ARTICULAR
  Filled 2023-10-09: qty 1

## 2023-10-09 NOTE — Progress Notes (Signed)
PROVIDER NOTE: Interpretation of information contained herein should be left to medically-trained personnel. Specific patient instructions are provided elsewhere under "Patient Instructions" section of medical record. This document was created in part using STT-dictation technology, any transcriptional errors that may result from this process are unintentional.  Patient: Sandy Byrd Type: Established DOB: [birth year] MRN: [id number] PCP: Enid Baas, MD  Service: Procedure DOS: 10/09/2023 Setting: Ambulatory Location: Ambulatory outpatient facility Delivery: Face-to-face Provider: Edward Jolly, MD Specialty: Interventional Pain Management Specialty designation: 09 Location: Outpatient facility Ref. 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  Risk Assessment: Vitals:  DGL:[id number] body mass index is 32.58 kg/m as calculated from the following:   Height as of this encounter: 5\' 7"  (1.702 m).   Weight as of this encounter: 208 lb (94.3 kg)., Rate:69ECG Heart Rate: 66, BP:136/69, Resp:18, Temp:(!) 97.3 F (36.3 C), SpO2:96 %  Allergies: She is allergic to cephalexin, cephalosporins, prochlorperazine, shrimp extract, bee venom, latex, other, and silicone.  Precautions: No additional precautions required  Blood-thinner(s): None at this time  Coagulopathies: Reviewed. None identified.   Active Infection(s): Reviewed. None identified. Sandy Byrd is afebrile    Location setting: Exam room Position: Sitting w/ knee bent 90 degrees Safety Precautions: Patient was assessed for positional comfort and pressure points before starting the procedure. Prepping solution: DuraPrep (Iodine Povacrylex [0.7% available iodine] and Isopropyl Alcohol, 74% w/w) Prep Area: Entire knee region Approach: percutaneous, just above the tibial plateau, lateral to the infrapatellar tendon. Intended target: Intra-articular knee space Materials: Tray: Block Needle(s): Regular Qty: 1/side Length: 1.5-inch Gauge: 25G (x1) + 22G (x1)  Meds ordered this encounter  Medications   lidocaine (XYLOCAINE) 2 % (with pres) injection 400 mg   diazepam (VALIUM) tablet 5 mg    Make sure Flumazenil is available in the pyxis when using this medication. If oversedation occurs, administer 0.2 mg IV over 15 sec. If after 45 sec no response, administer 0.2 mg again over 1 min; may repeat at 1 min intervals; not to exceed 4 doses (1 mg)   methylPREDNISolone acetate (DEPO-MEDROL) injection 40 mg   methylPREDNISolone acetate (DEPO-MEDROL) injection 80 mg   ropivacaine (PF) 2 mg/mL (0.2%) (NAROPIN) injection 15 mL   lidocaine (LIDODERM) 5 %    Sig: Place 1 patch onto the skin every 12 (twelve) hours. Remove & Discard patch within 12 hours or as directed by MD    Dispense:  60 patch    Refill:  11    Orders Placed This Encounter  Procedures   DG PAIN CLINIC C-ARM 1-60 MIN NO REPORT    Intraoperative interpretation by procedural physician at [facility].    Standing Status:   Standing    Number of Occurrences:   1    Order Specific Question:   Reason for exam:    Answer:   Assistance in needle guidance and placement for procedures requiring needle placement in or near specific anatomical locations not easily accessible without such assistance.     Time-out: 1000 I  initiated and conducted the "Time-out" before starting the procedure, as per protocol. The patient was asked to  participate by confirming the accuracy of the "Time Out" information. Verification of the correct person, site, and procedure were performed and confirmed by me, the nursing staff, and the patient. "Time-out" conducted as per Joint Commission's Universal Protocol (UP.01.01.01). Procedure checklist: Completed  H&P (Pre-op  Assessment)  Sandy Byrd is a 68 y.o. (year old), female patient, seen today for interventional treatment. She  has a past surgical history that includes Shoulder surgery (Right, 2016) and Shoulder surgery (Right, 2017). Sandy Byrd has a current medication list which includes the following prescription(s): acetaminophen, ascorbic acid, baclofen, benzonatate, celecoxib, cyclosporine, epinephrine, ergocalciferol, escitalopram, fluticasone, folic acid, furosemide, [START ON 10/10/2023] hydrocodone-acetaminophen, [START ON 11/09/2023] hydrocodone-acetaminophen, [START ON 12/09/2023] hydrocodone-acetaminophen, ketoconazole, levothyroxine, magnesium, metronidazole, potassium chloride sa, pregabalin, triamcinolone cream, triamterene-hydrochlorothiazide, turmeric, ventolin hfa, vitamin d (ergocalciferol), zolpidem, lidocaine, and simvastatin. Her primarily concern today is the Back Pain (Right ) and Knee Pain (Right )  She is allergic to cephalexin, cephalosporins, prochlorperazine, shrimp extract, bee venom, latex, other, and silicone.   Last encounter: My last encounter with her was on 04/10/2023. Pertinent problems: Sandy Byrd has Lumbar spondylosis; Lumbar degenerative disc disease; Chronic pain syndrome; and H/O rotator cuff surgery (bilateral) on their pertinent problem list. Pain Assessment: Severity of Chronic pain is reported as a 5 /10. Location: Knee Lower, Right/Radiates from lower back to right knee. Onset: More than a month ago. Quality: Constant, Burning, Sharp, Shooting, Stabbing. Timing: Constant. Modifying factor(s): RFA, resting, ice, heat, sleeping with pillow between  knees, and pain medication. Vitals:  height is 5\' 7"  (1.702 m) and weight is 208 lb (94.3 kg). Her temporal temperature is 97.3 F (36.3 C) (abnormal). Her blood pressure is 128/80 and her pulse is 69. Her respiration is 14 and oxygen saturation is 98%.   Reason for encounter: "interventional pain management therapy due pain of at least four (4) weeks in duration, with failure to respond and/or inability to tolerate more conservative care.  Site Confirmation: Ms. Cypher was asked to confirm the procedure and laterality before marking the site.  Consent: Before the procedure and under the influence of no sedative(s), amnesic(s), or anxiolytics, the patient was informed of the treatment options, risks and possible complications. To fulfill our ethical and legal obligations, as recommended by the American Medical Association's Code of Ethics, I have informed the patient of my clinical impression; the nature and purpose of the treatment or procedure; the risks, benefits, and possible complications of the intervention; the alternatives, including doing nothing; the risk(s) and benefit(s) of the alternative treatment(s) or procedure(s); and the risk(s) and benefit(s) of doing nothing. The patient was provided information about the general risks and possible complications associated with the procedure. These may include, but are not limited to: failure to achieve desired goals, infection, bleeding, organ or nerve damage, allergic reactions, paralysis, and death. In addition, the patient was informed of those risks and complications associated to Spine-related procedures, such as failure to decrease pain; infection (i.e.: Meningitis, epidural or intraspinal abscess); bleeding (i.e.: epidural hematoma, subarachnoid hemorrhage, or any other type of intraspinal or peri-dural bleeding); organ or nerve damage (i.e.: Any type of peripheral nerve, nerve root, or spinal cord injury) with subsequent damage to sensory,  motor, and/or autonomic systems, resulting in permanent pain, numbness, and/or weakness of one or several areas of the body; allergic reactions; (i.e.: anaphylactic reaction); and/or death. Furthermore, the patient was informed of those risks  and complications associated with the medications. These include, but are not limited to: allergic reactions (i.e.: anaphylactic or anaphylactoid reaction(s)); adrenal axis suppression; blood sugar elevation that in diabetics may result in ketoacidosis or comma; water retention that in patients with history of congestive heart failure may result in shortness of breath, pulmonary edema, and decompensation with resultant heart failure; weight gain; swelling or edema; medication-induced neural toxicity; particulate matter embolism and blood vessel occlusion with resultant organ, and/or nervous system infarction; and/or aseptic necrosis of one or more joints. Finally, the patient was informed that Medicine is not an exact science; therefore, there is also the possibility of unforeseen or unpredictable risks and/or possible complications that may result in a catastrophic outcome. The patient indicated having understood very clearly. We have given the patient no guarantees and we have made no promises. Enough time was given to the patient to ask questions, all of which were answered to the patient's satisfaction. Ms. Braun has indicated that she wanted to continue with the procedure. Attestation: I, the ordering provider, attest that I have discussed with the patient the benefits, risks, side-effects, alternatives, likelihood of achieving goals, and potential problems during recovery for the procedure that I have provided informed consent.  Date  Time: 10/09/2023  9:21 AM  Description of procedure  Start Time: 1000 hrs  Local Anesthesia: Once the patient was positioned, prepped, and time-out was completed. The target area was identified located. The skin was marked  with an approved surgical skin marker. Once marked, the skin (epidermis, dermis, and hypodermis), and deeper tissues (fat, connective tissue and muscle) were infiltrated with a small amount of a short-acting local anesthetic, loaded on a 10cc syringe with a 25G, 1.5-in  Needle. An appropriate amount of time was allowed for local anesthetics to take effect before proceeding to the next step. Local Anesthetic: Lidocaine 1-2% The unused portion of the local anesthetic was discarded in the proper designated containers. Safety Precautions: Aspiration looking for blood return was conducted prior to all injections. At no point did I inject any substances, as a needle was being advanced. Before injecting, the patient was told to immediately notify me if she was experiencing any new onset of "ringing in the ears, or metallic taste in the mouth". No attempts were made at seeking any paresthesias. Safe injection practices and needle disposal techniques used. Medications properly checked for expiration dates. SDV (single dose vial) medications used. After the completion of the procedure, all disposable equipment used was discarded in the proper designated medical waste containers.  Technical description: Protocol guidelines were followed. After positioning, the target area was identified and prepped in the usual manner. Skin & deeper tissues infiltrated with local anesthetic. Appropriate amount of time allowed to pass for local anesthetics to take effect. Proper needle placement secured. Once satisfactory needle placement was confirmed, I proceeded to inject the desired solution in slow, incremental fashion, intermittently assessing for discomfort or any signs of abnormal or undesired spread of substance. Once completed, the needle was removed and disposed of, as per hospital protocols. The area was cleaned, making sure to leave some of the prepping solution back to take advantage of its long term bactericidal  properties.  Aspiration:  Negative  5 cc solution made of 4 cc of 0.2% ropivacaine, 1 cc of methylprednisolone, 40 mg/cc.  Injected into the right intra-articular knee joint.        Vitals:   10/09/23 0926 10/09/23 1000 10/09/23 1004  BP: 136/69 128/76 128/80  Pulse: 69  Resp: 18 12 14   Temp: (!) 97.3 F (36.3 C)    TempSrc: Temporal    SpO2: 96% 96% 98%  Weight: 208 lb (94.3 kg)    Height: 5\' 7"  (1.702 m)      End Time: 1003 hrs  Imaging guidance  Imaging-assisted Technique: None required. Indication(s): N/A Exposure Time: N/A Contrast: None Fluoroscopic Guidance: N/A Ultrasound Guidance: N/A Interpretation: N/A  Post-op assessment  Post-procedure Vital Signs:  Pulse/HCG Rate: 6966 Temp: (!) 97.3 F (36.3 C) Resp: 14 BP: 128/80 SpO2: 98 %  EBL: None  Complications: No immediate post-treatment complications observed by team, or reported by patient.  Note: The patient tolerated the entire procedure well. A repeat set of vitals were taken after the procedure and the patient was kept under observation following institutional policy, for this type of procedure. Post-procedural neurological assessment was performed, showing return to baseline, prior to discharge. The patient was provided with post-procedure discharge instructions, including a section on how to identify potential problems. Should any problems arise concerning this procedure, the patient was given instructions to immediately contact us, at any time, without hesitation. In any case, we plan to contact the patient by telephone for a follow-up status report regarding this interventional procedure.  Comments:  No additional relevant information.  Plan of care  Chronic Opioid Analgesic:  Norco 7.5 mg BID  prn #60 per month   Medications administered: We administered lidocaine, diazepam, methylPREDNISolone acetate, methylPREDNISolone acetate, and ropivacaine (PF) 2 mg/mL (0.2%).  Follow-up plan:   No  follow-ups on file.      R L3,4,5 RFA 02/26/22; 08/29/22 repeat as needed.  Bilateral suprascapular nerve block 01/09/2023, right knee steroid 05/01/23        Recent Visits Date Type Provider Dept  10/01/23 Office Visit Edward Jolly, MD [facility]  07/31/23 Procedure visit Edward Jolly, MD [facility]  Showing recent visits within past 90 days and meeting all other requirements Today's Visits Date Type Provider Dept  10/09/23 Procedure visit Edward Jolly, MD [facility]  Showing today's visits and meeting all other requirements Future Appointments Date Type Provider Dept  12/19/23 Appointment Edward Jolly, MD [facility]  Showing future appointments within next 90 days and meeting all other requirements   Disposition: Discharge home  Discharge (Date  Time): 10/09/2023; 1015 hrs.   Primary Care Physician: Enid Baas, MD Location: [facility] Note by: Edward Jolly, MD Date: 10/09/2023; Time: 11:04 AM  DISCLAIMER: Medicine is not an exact science. It has no guarantees or warranties. The decision to proceed with this intervention was based on the information collected from the patient. Conclusions were drawn from the patient's questionnaire, interview, and examination. Because information was provided in large part by the patient, it cannot be guaranteed that it has not been purposely or unconsciously manipulated or altered. Every effort has been made to obtain as much accurate, relevant, available data as possible. Always take into account that the treatment will also be dependent on availability of resources and existing treatment guidelines, considered by other Pain Management Specialists as being common knowledge and practice, at the time of the intervention. It is also important to point out that variation in procedural techniques and pharmacological choices are the acceptable norm. For Medico-Legal review  purposes, the indications, contraindications, technique, and results of the these procedures should only be evaluated, judged and interpreted by a Board-Certified Interventional Pain Specialist with extensive familiarity and expertise in the same exact procedure and  technique.

## 2023-10-09 NOTE — Progress Notes (Signed)
PROVIDER NOTE: Interpretation of information contained herein should be left to medically-trained personnel. Specific patient instructions are provided elsewhere under "Patient Instructions" section of medical record. This document was created in part using STT-dictation technology, any transcriptional errors that may result from this process are unintentional.  Patient: Sandy Byrd Type: Established DOB: 1955/01/27 MRN: [id number] PCP: Enid Baas, MD  Service: Procedure DOS: 10/09/2023 Setting: Ambulatory Location: Ambulatory outpatient facility Delivery: Face-to-face Provider: Edward Jolly, MD Specialty: Interventional Pain Management Specialty designation: 09 Location: Outpatient facility Ref. Prov.: Enid Baas, MD       Interventional Therapy   ProcedureSacroiliac Joint Steroid Injection #1 and right piriformis TPI    Laterality: Right     Level: PIIS (Posterior inferior iliac Spine)  Imaging: Fluoroscopic guidance Anesthesia: Local anesthesia (1-2% Lidocaine) Sedation: Minimal Sedation                       DOS: 10/09/2023  Performed by: Edward Jolly, MD  Purpose: Diagnostic/Therapeutic Indications: Sacroiliac joint pain in the lower back and hip area severe enough to impact quality of life or function. Rationale (medical necessity): procedure needed and proper for the diagnosis and/or treatment of Sandy Byrd's medical symptoms and needs. 1. SI joint arthritis (HCC)   2. Sacroiliac joint pain   3. Piriformis syndrome of right side   4. Complex tear of meniscus of right knee, unspecified meniscus, unspecified whether old or current tear, sequela   5. Chronic pain of right knee   6. Primary osteoarthritis of right knee   7. Chronic pain syndrome    NAS-11 Pain score:   Pre-procedure: 5 /10   Post-procedure: 0-No pain/10     Target: Interarticular sacroiliac joint. Location: Medial to the postero-medial edge of iliac spine. Region:  Lumbosacral-sacrococcygeal. Approach: Inferior postero-medial percutaneous approach. Type of procedure: Percutaneous joint injection.  Position / Prep / Materials:  Position: Prone  Prep solution: DuraPrep (Iodine Povacrylex [0.7% available iodine] and Isopropyl Alcohol, 74% w/w) Prep Area: Entire posterior lumbosacral area  Materials:  Tray: Block Needle(s):  Type: Spinal  Gauge (G): 22  Length: 3.5-in Qty: 1  H&P (Pre-op Assessment):  Sandy Byrd is a 68 y.o. (year old), female patient, seen today for interventional treatment. She  has a past surgical history that includes Shoulder surgery (Right, 2016) and Shoulder surgery (Right, 2017). Sandy Byrd has a current medication list which includes the following prescription(s): acetaminophen, ascorbic acid, baclofen, benzonatate, celecoxib, cyclosporine, epinephrine, ergocalciferol, escitalopram, fluticasone, folic acid, furosemide, [START ON 10/10/2023] hydrocodone-acetaminophen, [START ON 11/09/2023] hydrocodone-acetaminophen, [START ON 12/09/2023] hydrocodone-acetaminophen, ketoconazole, levothyroxine, magnesium, metronidazole, potassium chloride sa, pregabalin, triamcinolone cream, triamterene-hydrochlorothiazide, turmeric, ventolin hfa, vitamin d (ergocalciferol), zolpidem, lidocaine, and simvastatin. Her primarily concern today is the Back Pain (Right ) and Knee Pain (Right )  Initial Vital Signs:  Pulse/HCG Rate: 69ECG Heart Rate: 66 Temp: (!) 97.3 F (36.3 C) Resp: 18 BP: 136/69 SpO2: 96 %  BMI: Estimated body mass index is 32.58 kg/m as calculated from the following:   Height as of this encounter: 5\' 7"  (1.702 m).   Weight as of this encounter: 208 lb (94.3 kg).  Risk Assessment: Allergies: Reviewed. She is allergic to cephalexin, cephalosporins, prochlorperazine, shrimp extract, bee venom, latex, other, and silicone.  Allergy Precautions: None required Coagulopathies: Reviewed. None identified.  Blood-thinner therapy:  None at this time Active Infection(s): Reviewed. None identified. Sandy Byrd is afebrile  Site Confirmation: Sandy Byrd was asked to confirm the procedure and laterality before marking the site  Procedure checklist: Completed Consent: Before the procedure and under the influence of no sedative(s), amnesic(s), or anxiolytics, the patient was informed of the treatment options, risks and possible complications. To fulfill our ethical and legal obligations, as recommended by the American Medical Association's Code of Ethics, I have informed the patient of my clinical impression; the nature and purpose of the treatment or procedure; the risks, benefits, and possible complications of the intervention; the alternatives, including doing nothing; the risk(s) and benefit(s) of the alternative treatment(s) or procedure(s); and the risk(s) and benefit(s) of doing nothing. The patient was provided information about the general risks and possible complications associated with the procedure. These may include, but are not limited to: failure to achieve desired goals, infection, bleeding, organ or nerve damage, allergic reactions, paralysis, and death. In addition, the patient was informed of those risks and complications associated to the procedure, such as failure to decrease pain; infection; bleeding; organ or nerve damage with subsequent damage to sensory, motor, and/or autonomic systems, resulting in permanent pain, numbness, and/or weakness of one or several areas of the body; allergic reactions; (i.e.: anaphylactic reaction); and/or death. Furthermore, the patient was informed of those risks and complications associated with the medications. These include, but are not limited to: allergic reactions (i.e.: anaphylactic or anaphylactoid reaction(s)); adrenal axis suppression; blood sugar elevation that in diabetics may result in ketoacidosis or comma; water retention that in patients with history of congestive  heart failure may result in shortness of breath, pulmonary edema, and decompensation with resultant heart failure; weight gain; swelling or edema; medication-induced neural toxicity; particulate matter embolism and blood vessel occlusion with resultant organ, and/or nervous system infarction; and/or aseptic necrosis of one or more joints. Finally, the patient was informed that Medicine is not an exact science; therefore, there is also the possibility of unforeseen or unpredictable risks and/or possible complications that may result in a catastrophic outcome. The patient indicated having understood very clearly. We have given the patient no guarantees and we have made no promises. Enough time was given to the patient to ask questions, all of which were answered to the patient's satisfaction. Ms. Dreyfuss has indicated that she wanted to continue with the procedure. Attestation: I, the ordering provider, attest that I have discussed with the patient the benefits, risks, side-effects, alternatives, likelihood of achieving goals, and potential problems during recovery for the procedure that I have provided informed consent. Date  Time: 10/09/2023  9:21 AM  Pre-Procedure Preparation:  Monitoring: As per clinic protocol. Respiration, ETCO2, SpO2, BP, heart rate and rhythm monitor placed and checked for adequate function Safety Precautions: Patient was assessed for positional comfort and pressure points before starting the procedure. Time-out: I initiated and conducted the "Time-out" before starting the procedure, as per protocol. The patient was asked to participate by confirming the accuracy of the "Time Out" information. Verification of the correct person, site, and procedure were performed and confirmed by me, the nursing staff, and the patient. "Time-out" conducted as per Joint Commission's Universal Protocol (UP.01.01.01). Time: 1000 Start Time: 1000 hrs.  Description/Narrative of Procedure:           Start Time: 1000 hrs.  Rationale (medical necessity): procedure needed and proper for the diagnosis and/or treatment of the patient's medical symptoms and needs. Procedural Technique Safety Precautions: Aspiration looking for blood return was conducted prior to all injections. At no point did we inject any substances, as a needle was being advanced. No attempts were made at seeking any paresthesias. Safe injection  practices and needle disposal techniques used. Medications properly checked for expiration dates. SDV (single dose vial) medications used. Description of the Procedure: Protocol guidelines were followed. The patient was assisted into a comfortable position. The target area was identified and the area prepped in the usual manner. Skin & deeper tissues infiltrated with local anesthetic. Appropriate amount of time allowed to pass for local anesthetics to take effect. The procedure needles were then advanced to the target area. Proper needle placement secured. Negative aspiration confirmed. Solution injected in intermittent fashion, asking for systemic symptoms every 0.5cc of injectate. The needles were then removed and the area cleansed, making sure to leave some of the prepping solution back to take advantage of its long term bactericidal properties.  Technical description of procedure:  Fluoroscopy using a posterior anterior 45 degree angle from the midline aiming at the anterolateral aspect of the patient was used to find a direct path into the sacroiliac joint, the superior medial to posterior superior iliac spine.  The skin was marked where the desired target and the skin infiltrated with local anesthetics.  The procedure needle was then advanced until the joint was entered.  Once inside of the joint, we then proceeded to inject the desired solution.  10 cc solution made of 9 cc of 0.2% ropivacaine, 1 cc of methylprednisolone, 80 mg/cc.  5 cc injected into the right SI joint after contrast  confirmation.  Afterwards a right piriformis trigger point injection was done 1 cm inferior, 1 cm deep, 1 cm lateral to the inferior fissure of the SI joint.  Contrast was injected to confirm piriformis muscle striation.  5 cc injected into the right piriformis muscle.  While injecting, patient did not complain of any pain radiating down her leg.   Vitals:   10/09/23 0926 10/09/23 1000 10/09/23 1004  BP: 136/69 128/76 128/80  Pulse: 69    Resp: 18 12 14   Temp: (!) 97.3 F (36.3 C)    TempSrc: Temporal    SpO2: 96% 96% 98%  Weight: 208 lb (94.3 kg)    Height: 5\' 7"  (1.702 m)       End Time: 1003 hrs.  Imaging Guidance (Non-Spinal):          Type of Imaging Technique: Fluoroscopy Guidance (Non-Spinal) Indication(s): Assistance in needle guidance and placement for procedures requiring needle placement in or near specific anatomical locations not easily accessible without such assistance. Exposure Time: Please see nurses notes. Contrast: None used. Fluoroscopic Guidance: I was personally present during the use of fluoroscopy. "Tunnel Vision Technique" used to obtain the best possible view of the target area. Parallax error corrected before commencing the procedure. "Direction-depth-direction" technique used to introduce the needle under continuous pulsed fluoroscopy. Once target was reached, antero-posterior, oblique, and lateral fluoroscopic projection used confirm needle placement in all planes. Images permanently stored in EMR. Interpretation: No contrast injected. I personally interpreted the imaging intraoperatively. Adequate needle placement confirmed in multiple planes. Permanent images saved into the patient's record.  Post-operative Assessment:  Post-procedure Vital Signs:  Pulse/HCG Rate: 6966 Temp: (!) 97.3 F (36.3 C) Resp: 14 BP: 128/80 SpO2: 98 %  EBL: None  Complications: No immediate post-treatment complications observed by team, or reported by patient.  Note: The  patient tolerated the entire procedure well. A repeat set of vitals were taken after the procedure and the patient was kept under observation following institutional policy, for this type of procedure. Post-procedural neurological assessment was performed, showing return to baseline, prior to discharge. The  patient was provided with post-procedure discharge instructions, including a section on how to identify potential problems. Should any problems arise concerning this procedure, the patient was given instructions to immediately contact us, at any time, without hesitation. In any case, we plan to contact the patient by telephone for a follow-up status report regarding this interventional procedure.  Comments:  No additional relevant information.  Plan of Care (POC)  Orders:  Orders Placed This Encounter  Procedures   DG PAIN CLINIC C-ARM 1-60 MIN NO REPORT    Intraoperative interpretation by procedural physician at [facility].    Standing Status:   Standing    Number of Occurrences:   1    Order Specific Question:   Reason for exam:    Answer:   Assistance in needle guidance and placement for procedures requiring needle placement in or near specific anatomical locations not easily accessible without such assistance.   Chronic Opioid Analgesic:  Norco 7.5 mg BID  prn #60 per month   Medications ordered for procedure: Meds ordered this encounter  Medications   lidocaine (XYLOCAINE) 2 % (with pres) injection 400 mg   diazepam (VALIUM) tablet 5 mg    Make sure Flumazenil is available in the pyxis when using this medication. If oversedation occurs, administer 0.2 mg IV over 15 sec. If after 45 sec no response, administer 0.2 mg again over 1 min; may repeat at 1 min intervals; not to exceed 4 doses (1 mg)   methylPREDNISolone acetate (DEPO-MEDROL) injection 40 mg   methylPREDNISolone acetate (DEPO-MEDROL) injection 80 mg   ropivacaine (PF) 2 mg/mL (0.2%) (NAROPIN) injection 15 mL    lidocaine (LIDODERM) 5 %    Sig: Place 1 patch onto the skin every 12 (twelve) hours. Remove & Discard patch within 12 hours or as directed by MD    Dispense:  60 patch    Refill:  11   Medications administered: We administered lidocaine, diazepam, methylPREDNISolone acetate, methylPREDNISolone acetate, and ropivacaine (PF) 2 mg/mL (0.2%).  See the medical record for exact dosing, route, and time of administration.  Follow-up plan:   No follow-ups on file.       R L3,4,5 RFA 02/26/22, 08/29/22, 07/31/23    Recent Visits Date Type Provider Dept  10/01/23 Office Visit Edward Jolly, MD [facility]  07/31/23 Procedure visit Edward Jolly, MD [facility]  Showing recent visits within past 90 days and meeting all other requirements Today's Visits Date Type Provider Dept  10/09/23 Procedure visit Edward Jolly, MD [facility]  Showing today's visits and meeting all other requirements Future Appointments Date Type Provider Dept  12/19/23 Appointment Edward Jolly, MD [facility]  Showing future appointments within next 90 days and meeting all other requirements  Disposition: Discharge home  Discharge (Date  Time): 10/09/2023; 1015 hrs.   Primary Care Physician: Enid Baas, MD Location: [facility] Note by: Edward Jolly, MD (TTS technology used. I apologize for any typographical errors that were not detected and corrected.) Date: 10/09/2023; Time: 11:05 AM  Disclaimer:  Medicine is not an Visual merchandiser. The only guarantee in medicine is that nothing is guaranteed. It is important to note that the decision to proceed with this intervention was based on the information collected from the patient. The Data and conclusions were drawn from the patient's questionnaire, the interview, and the physical examination. Because the information was provided in large part by the patient, it cannot be guaranteed that it has not  been purposely or unconsciously manipulated. Every effort has been made to obtain as much relevant data as possible for this evaluation. It is important to note that the conclusions that lead to this procedure are derived in large part from the available data. Always take into account that the treatment will also be dependent on availability of resources and existing treatment guidelines, considered by other Pain Management Practitioners as being common knowledge and practice, at the time of the intervention. For Medico-Legal purposes, it is also important to point out that variation in procedural techniques and pharmacological choices are the acceptable norm. The indications, contraindications, technique, and results of the above procedure should only be interpreted and judged by a Board-Certified Interventional Pain Specialist with extensive familiarity and expertise in the same exact procedure and technique.

## 2023-10-09 NOTE — Progress Notes (Signed)
Safety precautions to be maintained throughout the outpatient stay will include: orient to surroundings, keep bed in low position, maintain call bell within reach at all times, provide assistance with transfer out of bed and ambulation.  

## 2023-10-09 NOTE — Patient Instructions (Signed)

## 2023-10-10 ENCOUNTER — Telehealth: Payer: Self-pay | Admitting: *Deleted

## 2023-10-10 NOTE — Telephone Encounter (Signed)
No problems post procedure. 

## 2023-11-14 ENCOUNTER — Telehealth: Payer: Self-pay | Admitting: Student in an Organized Health Care Education/Training Program

## 2023-11-14 NOTE — Telephone Encounter (Signed)
Patient is wondering if it is too soon to get RFA on lower right, SI Joint injections  and injections in her right knee

## 2023-12-02 ENCOUNTER — Encounter: Payer: Self-pay | Admitting: Student in an Organized Health Care Education/Training Program

## 2023-12-02 ENCOUNTER — Ambulatory Visit
Payer: Medicare Other | Attending: Student in an Organized Health Care Education/Training Program | Admitting: Student in an Organized Health Care Education/Training Program

## 2023-12-02 VITALS — BP 152/79 | HR 59 | Temp 97.0°F | Resp 100 | Ht 67.0 in | Wt 218.0 lb

## 2023-12-02 DIAGNOSIS — M533 Sacrococcygeal disorders, not elsewhere classified: Secondary | ICD-10-CM | POA: Insufficient documentation

## 2023-12-02 DIAGNOSIS — M461 Sacroiliitis, not elsewhere classified: Secondary | ICD-10-CM | POA: Diagnosis present

## 2023-12-02 DIAGNOSIS — G8929 Other chronic pain: Secondary | ICD-10-CM

## 2023-12-02 DIAGNOSIS — G894 Chronic pain syndrome: Secondary | ICD-10-CM | POA: Insufficient documentation

## 2023-12-02 DIAGNOSIS — M25561 Pain in right knee: Secondary | ICD-10-CM

## 2023-12-02 DIAGNOSIS — G5701 Lesion of sciatic nerve, right lower limb: Secondary | ICD-10-CM | POA: Diagnosis present

## 2023-12-02 DIAGNOSIS — M1711 Unilateral primary osteoarthritis, right knee: Secondary | ICD-10-CM

## 2023-12-02 MED ORDER — LIDOCAINE HCL 2 % IJ SOLN
20.0000 mL | Freq: Once | INTRAMUSCULAR | Status: AC
  Administered 2023-12-02: 200 mg

## 2023-12-02 MED ORDER — LIDOCAINE HCL (PF) 2 % IJ SOLN
INTRAMUSCULAR | Status: AC
Start: 2023-12-02 — End: ?
  Filled 2023-12-02: qty 10

## 2023-12-02 MED ORDER — SODIUM HYALURONATE (VISCOSUP) 20 MG/2ML IX SOSY
2.0000 mL | PREFILLED_SYRINGE | Freq: Once | INTRA_ARTICULAR | Status: AC
  Administered 2023-12-02: 20 mg via INTRA_ARTICULAR

## 2023-12-02 NOTE — Progress Notes (Signed)
PROVIDER NOTE: Interpretation of information contained herein should be left to medically-trained personnel. Specific patient instructions are provided elsewhere under "Patient Instructions" section of medical record. This document was created in part using STT-dictation technology, any transcriptional errors that may result from this process are unintentional.  Patient: Sandy Byrd Type: Established DOB: Apr 09, 1955 MRN: [id number] PCP: Enid Baas, MD  Service: Procedure DOS: 12/02/2023 Setting: Ambulatory Location: Ambulatory outpatient facility Delivery: Face-to-face Provider: Edward Jolly, MD Specialty: Interventional Pain Management Specialty designation: 09 Location: Outpatient facility Ref. Prov.: Enid Baas, MD       Interventional Therapy   Procedure:           Type: Hyalgan Intra-articular Knee Injection #1  Laterality: Right (-RT) Level/approach: Medial Imaging guidance: None required ([id number]) Anesthesia: Local anesthesia (1-2% Lidocaine) DOS: 12/02/2023  Performed by: Edward Jolly, MD  Purpose: Diagnostic/Therapeutic Indications: Knee arthralgia associated to osteoarthritis of the knee 1. Chronic pain of right knee   2. Primary osteoarthritis of right knee   3. SI joint arthritis (HCC)   4. Sacroiliac joint pain   5. Piriformis syndrome of right side   6. Chronic pain syndrome    NAS-11 score:   Pre-procedure: 8 /10   Post-procedure: 0-No pain/10     Pre-Procedure Preparation  Monitoring: As per clinic protocol.  Risk Assessment: Vitals:  ONG:[id number] body mass index is 34.14 kg/m as calculated from the following:   Height as of this encounter: 5\' 7"  (1.702 m).   Weight as of this encounter: 218 lb (98.9 kg)., Rate:(!) 59 , BP:(!) 152/79, Resp:(!) 100, Temp:(!) 97 F (36.1 C), SpO2:   Allergies: She is allergic to cephalexin, cephalosporins, prochlorperazine, shrimp extract, bee venom, latex, other, and silicone.  Precautions: No  additional precautions required  Blood-thinner(s): None at this time  Coagulopathies: Reviewed. None identified.   Active Infection(s): Reviewed. None identified. Sandy Byrd is afebrile   Location setting: Exam room Position: Sitting w/ knee bent 90 degrees Safety Precautions: Patient was assessed for positional comfort and pressure points before starting the procedure. Prepping solution: DuraPrep (Iodine Povacrylex [0.7% available iodine] and Isopropyl Alcohol, 74% w/w) Prep Area: Entire knee region Approach: percutaneous, just above the tibial plateau, lateral to the infrapatellar tendon. Intended target: Intra-articular knee space Materials: Tray: Block Needle(s): Regular Qty: 1/side Length: 1.5-inch Gauge: 25G (x1) + 22G (x1)  Meds ordered this encounter  Medications   lidocaine (XYLOCAINE) 2 % (with pres) injection 400 mg   Sodium Hyaluronate (Viscosup) SOSY 20 mg    Do not substitute. Deliver to facility day before procedure.    Orders Placed This Encounter  Procedures   SACROILIAC JOINT INJECTION    Standing Status:   Future    Standing Expiration Date:   03/01/2024    Scheduling Instructions:     Side: RIGHT     Sedation: Patient's choice.     Timeframe: ASAP    Order Specific Question:   Where will this procedure be performed?    Answer:   [facility]   TRIGGER POINT INJECTION    Area: Buttocks region (gluteal area) Indications: Piriformis muscle pain; Right (G57.01) piriformis-syndrome; piriformis muscle spasms (G40.102). CPT code: 72536    Scheduling Instructions:     Right piriformis    Order Specific Question:   Where will this procedure be performed?    Answer:   [facility]     Time-out: 1047 I initiated and conducted the "Time-out" before starting the procedure, as per protocol. The patient was  asked to participate by confirming the accuracy of the "Time Out" information. Verification of the correct person, site, and procedure were  performed and confirmed by me, the nursing staff, and the patient. "Time-out" conducted as per Joint Commission's Universal Protocol (UP.01.01.01). Procedure checklist: Completed  H&P (Pre-op  Assessment)  Sandy Byrd is a 68 y.o. (year old), female patient, seen today for interventional treatment. She  has a past surgical history that includes Shoulder surgery (Right, 2016) and Shoulder surgery (Right, 2017). Sandy Byrd has a current medication list which includes the following prescription(s): acetaminophen, alprazolam, ascorbic acid, baclofen, celecoxib, cyanocobalamin, cyclosporine, epinephrine, ergocalciferol, escitalopram, fluticasone, folic acid, furosemide, hydrocodone-acetaminophen, [START ON 12/09/2023] hydrocodone-acetaminophen, ketoconazole, levothyroxine, lidocaine, magnesium, metronidazole, potassium chloride sa, pregabalin, simvastatin, triamcinolone cream, triamterene-hydrochlorothiazide, ventolin hfa, zolpidem, benzonatate, turmeric, and vitamin d (ergocalciferol). Her primarily concern today is the Knee Pain (Right )  She is allergic to cephalexin, cephalosporins, prochlorperazine, shrimp extract, bee venom, latex, other, and silicone.   Last encounter: My last encounter with her was on 04/10/2023. Pertinent problems: Sandy Byrd has Lumbar spondylosis; Lumbar degenerative disc disease; Chronic pain syndrome; and H/O rotator cuff surgery (bilateral) on their pertinent problem list. Pain Assessment: Severity of Chronic pain is reported as a 8 /10. Location: Knee Right/denies. Onset: More than a month ago. Quality: Constant, Burning, Sharp, Shooting, Stabbing. Timing: Constant. Modifying factor(s): heat, medications. Vitals:  height is 5\' 7"  (1.702 m) and weight is 218 lb (98.9 kg). Her temporal temperature is 97 F (36.1 C) (abnormal). Her blood pressure is 152/79 (abnormal) and her pulse is 59 (abnormal). Her respiration is 100 (abnormal).   Reason for encounter:  "interventional pain management therapy due pain of at least four (4) weeks in duration, with failure to respond and/or inability to tolerate more conservative care.  Site Confirmation: Sandy Byrd was asked to confirm the procedure and laterality before marking the site.  Consent: Before the procedure and under the influence of no sedative(s), amnesic(s), or anxiolytics, the patient was informed of the treatment options, risks and possible complications. To fulfill our ethical and legal obligations, as recommended by the American Medical Association's Code of Ethics, I have informed the patient of my clinical impression; the nature and purpose of the treatment or procedure; the risks, benefits, and possible complications of the intervention; the alternatives, including doing nothing; the risk(s) and benefit(s) of the alternative treatment(s) or procedure(s); and the risk(s) and benefit(s) of doing nothing. The patient was provided information about the general risks and possible complications associated with the procedure. These may include, but are not limited to: failure to achieve desired goals, infection, bleeding, organ or nerve damage, allergic reactions, paralysis, and death. In addition, the patient was informed of those risks and complications associated to Spine-related procedures, such as failure to decrease pain; infection (i.e.: Meningitis, epidural or intraspinal abscess); bleeding (i.e.: epidural hematoma, subarachnoid hemorrhage, or any other type of intraspinal or peri-dural bleeding); organ or nerve damage (i.e.: Any type of peripheral nerve, nerve root, or spinal cord injury) with subsequent damage to sensory, motor, and/or autonomic systems, resulting in permanent pain, numbness, and/or weakness of one or several areas of the body; allergic reactions; (i.e.: anaphylactic reaction); and/or death. Furthermore, the patient was informed of those risks and complications associated with the  medications. These include, but are not limited to: allergic reactions (i.e.: anaphylactic or anaphylactoid reaction(s)); adrenal axis suppression; blood sugar elevation that in diabetics may result in ketoacidosis or comma; water retention that in patients with history of congestive  heart failure may result in shortness of breath, pulmonary edema, and decompensation with resultant heart failure; weight gain; swelling or edema; medication-induced neural toxicity; particulate matter embolism and blood vessel occlusion with resultant organ, and/or nervous system infarction; and/or aseptic necrosis of one or more joints. Finally, the patient was informed that Medicine is not an exact science; therefore, there is also the possibility of unforeseen or unpredictable risks and/or possible complications that may result in a catastrophic outcome. The patient indicated having understood very clearly. We have given the patient no guarantees and we have made no promises. Enough time was given to the patient to ask questions, all of which were answered to the patient's satisfaction. Ms. Brandli has indicated that she wanted to continue with the procedure. Attestation: I, the ordering provider, attest that I have discussed with the patient the benefits, risks, side-effects, alternatives, likelihood of achieving goals, and potential problems during recovery for the procedure that I have provided informed consent.  Date  Time: 12/02/2023 10:13 AM  Description of procedure  Start Time: 1047 hrs  Local Anesthesia: Once the patient was positioned, prepped, and time-out was completed. The target area was identified located. The skin was marked with an approved surgical skin marker. Once marked, the skin (epidermis, dermis, and hypodermis), and deeper tissues (fat, connective tissue and muscle) were infiltrated with a small amount of a short-acting local anesthetic, loaded on a 10cc syringe with a 25G, 1.5-in  Needle. An  appropriate amount of time was allowed for local anesthetics to take effect before proceeding to the next step. Local Anesthetic: Lidocaine 1-2% The unused portion of the local anesthetic was discarded in the proper designated containers. Safety Precautions: Aspiration looking for blood return was conducted prior to all injections. At no point did I inject any substances, as a needle was being advanced. Before injecting, the patient was told to immediately notify me if she was experiencing any new onset of "ringing in the ears, or metallic taste in the mouth". No attempts were made at seeking any paresthesias. Safe injection practices and needle disposal techniques used. Medications properly checked for expiration dates. SDV (single dose vial) medications used. After the completion of the procedure, all disposable equipment used was discarded in the proper designated medical waste containers.  Technical description: Protocol guidelines were followed. After positioning, the target area was identified and prepped in the usual manner. Skin & deeper tissues infiltrated with local anesthetic. Appropriate amount of time allowed to pass for local anesthetics to take effect. Proper needle placement secured. Once satisfactory needle placement was confirmed, I proceeded to inject the desired solution in slow, incremental fashion, intermittently assessing for discomfort or any signs of abnormal or undesired spread of substance. Once completed, the needle was removed and disposed of, as per hospital protocols. The area was cleaned, making sure to leave some of the prepping solution back to take advantage of its long term bactericidal properties.  Aspiration:  Negative        Vitals:   12/02/23 1015  BP: (!) 152/79  Pulse: (!) 59  Resp: (!) 100  Temp: (!) 97 F (36.1 C)  TempSrc: Temporal  Weight: 218 lb (98.9 kg)  Height: 5\' 7"  (1.702 m)  HC: 16" (40.6 cm)    End Time: 1050 hrs  Imaging guidance   Imaging-assisted Technique: None required. Indication(s): N/A Exposure Time: N/A Contrast: None Fluoroscopic Guidance: N/A Ultrasound Guidance: N/A Interpretation: N/A  Post-op assessment  Post-procedure Vital Signs:  Pulse/HCG Rate: (!) 59  Temp: (!) 97 F (36.1 C) Resp: (!) 100 BP: (!) 152/79 SpO2:    EBL: None  Complications: No immediate post-treatment complications observed by team, or reported by patient.  Note: The patient tolerated the entire procedure well. A repeat set of vitals were taken after the procedure and the patient was kept under observation following institutional policy, for this type of procedure. Post-procedural neurological assessment was performed, showing return to baseline, prior to discharge. The patient was provided with post-procedure discharge instructions, including a section on how to identify potential problems. Should any problems arise concerning this procedure, the patient was given instructions to immediately contact us, at any time, without hesitation. In any case, we plan to contact the patient by telephone for a follow-up status report regarding this interventional procedure.  Comments:  No additional relevant information.  Plan of care  Chronic Opioid Analgesic:  Norco 7.5 mg BID  prn #60 per month   Patient is endorsing increased right SI joint and right piriformis pain.  She is status post right SI joint and piriformis injection on 10/09/2023.  She states that this injection was very helpful for her pain and she is still noticing some benefit although she does state that some of her pain is returning.  She is scheduled to go out of the country to [place] at the end of this month and would like to get a repeat right SI joint and right piriformis trigger point injection done before she departs to that she can be more functional and active on her trip.  Risks and benefits reviewed and patient would like to proceed.  Physical exam findings as  documented below consistent with SI joint dysfunction and piriformis syndrome.  Lumbar Spine Area Exam  Skin & Axial Inspection: No masses, redness, or swelling Alignment: Symmetrical Functional ROM: Pain restricted ROM affecting primarily the right Stability: No instability detected Muscle Tone/Strength: Functionally intact. No obvious neuro-muscular anomalies detected. Sensory (Neurological): Improved Palpation: No palpable anomalies       Provocative Tests: Hyperextension/rotation test: Improved after treatment       Lumbar quadrant test (Kemp's test): Improved after treatment       Lateral bending test: Improved after treatment       Patrick's Maneuver: (+) for right-sided S-I arthralgia  FABER* test: (+) for right-sided S-I arthralgia  S-I anterior distraction/compression test: (+) for right-sided S-I arthralgia  S-I lateral compression test:(+) for right-sided S-I arthralgia  S-I Thigh-thrust test:(+) for right-sided S-I arthralgia  S-I Gaenslen's test: (+) for right-sided S-I arthralgia    Gait & Posture Assessment  Ambulation: Unassisted Gait: Relatively normal for age and body habitus Posture: WNL  Lower Extremity Exam      Side: Right lower extremity   Side: Left lower extremity  Stability: No instability observed           Stability: No instability observed          Skin & Extremity Inspection: Skin color, temperature, and hair growth are WNL. No peripheral edema or cyanosis. No masses, redness, swelling, asymmetry, or associated skin lesions. No contractures.   Skin & Extremity Inspection: Skin color, temperature, and hair growth are WNL. No peripheral edema or cyanosis. No masses, redness, swelling, asymmetry, or associated skin lesions. No contractures.  Functional ROM: Pain restricted ROM for knee joint           Functional ROM: Unrestricted ROM  Muscle Tone/Strength: Functionally intact. No obvious neuro-muscular anomalies detected.   Muscle  Tone/Strength: Functionally intact. No obvious neuro-muscular anomalies detected.  Sensory (Neurological): Arthropathic arthralgia         Sensory (Neurological): Unimpaired        DTR: Patellar: deferred today Achilles: deferred today Plantar: deferred today   DTR: Patellar: deferred today Achilles: deferred today Plantar: deferred today  Palpation: No palpable anomalies   Palpation: No palpable anomalies    Medications administered: We administered lidocaine and Sodium Hyaluronate (Viscosup).  Follow-up plan:   Return in about 3 weeks (around 12/23/2023) for Right SIJ and Right Piriformis.      R L3,4,5 RFA 02/26/22; 08/29/22 repeat as needed.  Bilateral suprascapular nerve block 01/09/2023, right knee steroid 05/01/23        Recent Visits Date Type Provider Dept  10/09/23 Procedure visit Edward Jolly, MD [facility]  10/01/23 Office Visit Edward Jolly, MD [facility]  Showing recent visits within past 90 days and meeting all other requirements Today's Visits Date Type Provider Dept  12/02/23 Procedure visit Edward Jolly, MD [facility]  Showing today's visits and meeting all other requirements Future Appointments Date Type Provider Dept  12/18/23 Appointment Edward Jolly, MD [facility]  12/19/23 Appointment Edward Jolly, MD [facility]  Showing future appointments within next 90 days and meeting all other requirements   Disposition: Discharge home  Discharge (Date  Time): 12/02/2023; 1053 hrs.   Primary Care Physician: Enid Baas, MD Location: [facility] Note by: Edward Jolly, MD Date: 12/02/2023; Time: 12:18 PM  DISCLAIMER: Medicine is not an exact science. It has no guarantees or warranties. The decision to proceed with this intervention was based on the information collected from the patient. Conclusions were drawn from the patient's questionnaire, interview, and examination.  Because information was provided in large part by the patient, it cannot be guaranteed that it has not been purposely or unconsciously manipulated or altered. Every effort has been made to obtain as much accurate, relevant, available data as possible. Always take into account that the treatment will also be dependent on availability of resources and existing treatment guidelines, considered by other Pain Management Specialists as being common knowledge and practice, at the time of the intervention. It is also important to point out that variation in procedural techniques and pharmacological choices are the acceptable norm. For Medico-Legal review purposes, the indications, contraindications, technique, and results of the these procedures should only be evaluated, judged and interpreted by a Board-Certified Interventional Pain Specialist with extensive familiarity and expertise in the same exact procedure and technique.

## 2023-12-02 NOTE — Progress Notes (Signed)
Safety precautions to be maintained throughout the outpatient stay will include: orient to surroundings, keep bed in low position, maintain call bell within reach at all times, provide assistance with transfer out of bed and ambulation.  

## 2023-12-02 NOTE — Patient Instructions (Signed)
______________________________________________________________________    Post-Procedure Discharge Instructions  Instructions: Apply ice:  Purpose: This will minimize any swelling and discomfort after procedure.  When: Day of procedure, as soon as you get home. How: Fill a plastic sandwich bag with crushed ice. Cover it with a small towel and apply to injection site. How long: (15 min on, 15 min off) Apply for 15 minutes then remove x 15 minutes.  Repeat sequence on day of procedure, until you go to bed. Apply heat:  Purpose: To treat any soreness and discomfort from the procedure. When: Starting the next day after the procedure. How: Apply heat to procedure site starting the day following the procedure. How long: May continue to repeat daily, until discomfort goes away. Food intake: Start with clear liquids (like water) and advance to regular food, as tolerated.  Physical activities: Keep activities to a minimum for the first 8 hours after the procedure. After that, then as tolerated. Driving: If you have received any sedation, be responsible and do not drive. You are not allowed to drive for 24 hours after having sedation. Blood thinner: (Applies only to those taking blood thinners) You may restart your blood thinner 6 hours after your procedure. Insulin: (Applies only to Diabetic patients taking insulin) As soon as you can eat, you may resume your normal dosing schedule. Infection prevention: Keep procedure site clean and dry. Shower daily and clean area with soap and water. Post-procedure Pain Diary: Extremely important that this be done correctly and accurately. Recorded information will be used to determine the next step in treatment. For the purpose of accuracy, follow these rules: Evaluate only the area treated. Do not report or include pain from an untreated area. For the purpose of this evaluation, ignore all other areas of pain, except for the treated area. After your procedure,  avoid taking a long nap and attempting to complete the pain diary after you wake up. Instead, set your alarm clock to go off every hour, on the hour, for the initial 8 hours after the procedure. Document the duration of the numbing medicine, and the relief you are getting from it. Do not go to sleep and attempt to complete it later. It will not be accurate. If you received sedation, it is likely that you were given a medication that may cause amnesia. Because of this, completing the diary at a later time may cause the information to be inaccurate. This information is needed to plan your care. Follow-up appointment: Keep your post-procedure follow-up evaluation appointment after the procedure (usually 2 weeks for most procedures, 6 weeks for radiofrequencies). DO NOT FORGET to bring you pain diary with you.   Expect: (What should I expect to see with my procedure?) From numbing medicine (AKA: Local Anesthetics): Numbness or decrease in pain. You may also experience some weakness, which if present, could last for the duration of the local anesthetic. Onset: Full effect within 15 minutes of injected. Duration: It will depend on the type of local anesthetic used. On the average, 1 to 8 hours.  From steroids (Applies only if steroids were used): Decrease in swelling or inflammation. Once inflammation is improved, relief of the pain will follow. Onset of benefits: Depends on the amount of swelling present. The more swelling, the longer it will take for the benefits to be seen. In some cases, up to 10 days. Duration: Steroids will stay in the system x 2 weeks. Duration of benefits will depend on multiple posibilities including persistent irritating factors. Side-effects: If  present, they may typically last 2 weeks (the duration of the steroids). Frequent: Cramps (if they occur, drink Gatorade and take over-the-counter Magnesium 450-500 mg once to twice a day); water retention with temporary weight gain;  increases in blood sugar; decreased immune system response; increased appetite. Occasional: Facial flushing (red, warm cheeks); mood swings; menstrual changes. Uncommon: Long-term decrease or suppression of natural hormones; bone thinning. (These are more common with higher doses or more frequent use. This is why we prefer that our patients avoid having any injection therapies in other practices.)  Very Rare: Severe mood changes; psychosis; aseptic necrosis. From procedure: Some discomfort is to be expected once the numbing medicine wears off. This should be minimal if ice and heat are applied as instructed.  Call if: (When should I call?) You experience numbness and weakness that gets worse with time, as opposed to wearing off. New onset bowel or bladder incontinence. (Applies only to procedures done in the spine)  Emergency Numbers: Durning business hours (Monday - Thursday, 8:00 AM - 4:00 PM) (Friday, 9:00 AM - 12:00 Noon): [phone] After hours: [phone] NOTE: If you are having a problem and are unable connect with, or to talk to a provider, then go to your nearest urgent care or emergency department. If the problem is serious and urgent, please call 911. ______________________________________________________________________      ______________________________________________________________________    General Risks and Possible Complications  Patient Responsibilities: It is important that you read this as it is part of your informed consent. It is our duty to inform you of the risks and possible complications associated with treatments offered to you. It is your responsibility as a patient to read this and to ask questions about anything that is not clear or that you believe was not covered in this document.  Patient's Rights: You have the right to refuse treatment. You also have the right to change your mind, even after initially having agreed to have the treatment done.  However, under this last option, if you wait until the last second to change your mind, you may be charged for the materials used up to that point.  Introduction: Medicine is not an Visual merchandiser. Everything in Medicine, including the lack of treatment(s), carries the potential for danger, harm, or loss (which is by definition: Risk). In Medicine, a complication is a secondary problem, condition, or disease that can aggravate an already existing one. All treatments carry the risk of possible complications. The fact that a side effects or complications occurs, does not imply that the treatment was conducted incorrectly. It must be clearly understood that these can happen even when everything is done following the highest safety standards.  No treatment: You can choose not to proceed with the proposed treatment alternative. The "PRO(s)" would include: avoiding the risk of complications associated with the therapy. The "CON(s)" would include: not getting any of the treatment benefits. These benefits fall under one of three categories: diagnostic; therapeutic; and/or palliative. Diagnostic benefits include: getting information which can ultimately lead to improvement of the disease or symptom(s). Therapeutic benefits are those associated with the successful treatment of the disease. Finally, palliative benefits are those related to the decrease of the primary symptoms, without necessarily curing the condition (example: decreasing the pain from a flare-up of a chronic condition, such as incurable terminal cancer).  General Risks and Complications: These are associated to most interventional treatments. They can occur alone, or in combination. They fall under one of the following six (  6) categories: no benefit or worsening of symptoms; bleeding; infection; nerve damage; allergic reactions; and/or death. No benefits or worsening of symptoms: In Medicine there are no guarantees, only probabilities. No healthcare  provider can ever guarantee that a medical treatment will work, they can only state the probability that it may. Furthermore, there is always the possibility that the condition may worsen, either directly, or indirectly, as a consequence of the treatment. Bleeding: This is more common if the patient is taking a blood thinner, either prescription or over the counter (example: Goody Powders, Fish oil, Aspirin, Garlic, etc.), or if suffering a condition associated with impaired coagulation (example: Hemophilia, cirrhosis of the liver, low platelet counts, etc.). However, even if you do not have one on these, it can still happen. If you have any of these conditions, or take one of these drugs, make sure to notify your treating physician. Infection: This is more common in patients with a compromised immune system, either due to disease (example: diabetes, cancer, human immunodeficiency virus [HIV], etc.), or due to medications or treatments (example: therapies used to treat cancer and rheumatological diseases). However, even if you do not have one on these, it can still happen. If you have any of these conditions, or take one of these drugs, make sure to notify your treating physician. Nerve Damage: This is more common when the treatment is an invasive one, but it can also happen with the use of medications, such as those used in the treatment of cancer. The damage can occur to small secondary nerves, or to large primary ones, such as those in the spinal cord and brain. This damage may be temporary or permanent and it may lead to impairments that can range from temporary numbness to permanent paralysis and/or brain death. Allergic Reactions: Any time a substance or material comes in contact with our body, there is the possibility of an allergic reaction. These can range from a mild skin rash (contact dermatitis) to a severe systemic reaction (anaphylactic reaction), which can result in death. Death: In general, any  medical intervention can result in death, most of the time due to an unforeseen complication. ______________________________________________________________________      ______________________________________________________________________    Preparing for your procedure  Appointments: If you think you may not be able to keep your appointment, call 24-48 hours in advance to cancel. We need time to make it available to others.  During your procedure appointment there will be: No Prescription Refills. No disability issues to discussed. No medication changes or discussions.  Instructions: Food intake: Avoid eating anything solid for at least 8 hours prior to your procedure. Clear liquid intake: You may take clear liquids such as water up to 2 hours prior to your procedure. (No carbonated drinks. No soda.) Transportation: Unless otherwise stated by your physician, bring a driver. (Driver cannot be a Market researcher, Pharmacist, community, or any other form of public transportation.) Morning Medicines: Except for blood thinners, take all of your other morning medications with a sip of water. Make sure to take your heart and blood pressure medicines. If your blood pressure's lower number is above 100, the case will be rescheduled. Blood thinners: Make sure to stop your blood thinners as instructed.  If you take a blood thinner, but were not instructed to stop it, call our office [phone] and ask to talk to a nurse. Not stopping a blood thinner prior to certain procedures could lead to serious complications. Diabetics on insulin: Notify the staff so that you  can be scheduled 1st case in the morning. If your diabetes requires high dose insulin, take only  of your normal insulin dose the morning of the procedure and notify the staff that you have done so. Preventing infections: Shower with an antibacterial soap the morning of your procedure.  Build-up your immune system: Take 1000 mg of Vitamin C with every meal (3 times  a day) the day prior to your procedure. Antibiotics: Inform the nursing staff if you are taking any antibiotics or if you have any conditions that may require antibiotics prior to procedures. (Example: recent joint implants)   Pregnancy: If you are pregnant make sure to notify the nursing staff. Not doing so may result in injury to the fetus, including death.  Sickness: If you have a cold, fever, or any active infections, call and cancel or reschedule your procedure. Receiving steroids while having an infection may result in complications. Arrival: You must be in the facility at least 30 minutes prior to your scheduled procedure. Tardiness: Your scheduled time is also the cutoff time. If you do not arrive at least 15 minutes prior to your procedure, you will be rescheduled.  Children: Do not bring any children with you. Make arrangements to keep them home. Dress appropriately: There is always a possibility that your clothing may get soiled. Avoid long dresses. Valuables: Do not bring any jewelry or valuables.  Reasons to call and reschedule or cancel your procedure: (Following these recommendations will minimize the risk of a serious complication.) Surgeries: Avoid having procedures within 2 weeks of any surgery. (Avoid for 2 weeks before or after any surgery). Flu Shots: Avoid having procedures within 2 weeks of a flu shots or . (Avoid for 2 weeks before or after immunizations). Barium: Avoid having a procedure within 7-10 days after having had a radiological study involving the use of radiological contrast. (Myelograms, Barium swallow or enema study). Heart attacks: Avoid any elective procedures or surgeries for the initial 6 months after a "Myocardial Infarction" (Heart Attack). Blood thinners: It is imperative that you stop these medications before procedures. Let us know if you if you take any blood thinner.  Infection: Avoid procedures during or within two weeks of an infection (including chest  colds or gastrointestinal problems). Symptoms associated with infections include: Localized redness, fever, chills, night sweats or profuse sweating, burning sensation when voiding, cough, congestion, stuffiness, runny nose, sore throat, diarrhea, nausea, vomiting, cold or Flu symptoms, recent or current infections. It is specially important if the infection is over the area that we intend to treat. Heart and lung problems: Symptoms that may suggest an active cardiopulmonary problem include: cough, chest pain, breathing difficulties or shortness of breath, dizziness, ankle swelling, uncontrolled high or unusually low blood pressure, and/or palpitations. If you are experiencing any of these symptoms, cancel your procedure and contact your primary care physician for an evaluation.  Remember:  Regular Business hours are:  Monday to Thursday 8:00 AM to 4:00 PM  Provider's Schedule: Delano Metz, MD:  Procedure days: Tuesday and Thursday 7:30 AM to 4:00 PM  Edward Jolly, MD:  Procedure days: Monday and Wednesday 7:30 AM to 4:00 PM Last  Updated: 08/20/2023 ______________________________________________________________________     Sacroiliac (SI) Joint Injection Patient Information  Description: The sacroiliac joint connects the scrum (very low back and tailbone) to the ilium (a pelvic bone which also forms half of the hip joint).  Normally this joint experiences very little motion.  When this joint becomes inflamed or unstable low  back and or hip and pelvis pain may result.  Injection of this joint with local anesthetics (numbing medicines) and steroids can provide diagnostic information and reduce pain.  This injection is performed with the aid of x-ray guidance into the tailbone area while you are lying on your stomach.   You may experience an electrical sensation down the leg while this is being done.  You may also experience numbness.  We also may ask if we are reproducing your normal pain  during the injection.  Conditions which may be treated SI injection:  Low back, buttock, hip or leg pain  Preparation for the Injection:  Do not eat any solid food or dairy products within 8 hours of your appointment.  You may drink clear liquids up to 3 hours before appointment.  Clear liquids include water, black coffee, juice or soda.  No milk or cream please. You may take your regular medications, including pain medications with a sip of water before your appointment.  Diabetics should hold regular insulin (if take separately) and take 1/2 normal NPH dose the morning of the procedure.  Carry some sugar containing items with you to your appointment. A driver must accompany you and be prepared to drive you home after your procedure. Bring all of your current medications with you. An IV may be inserted and sedation may be given at the discretion of the physician. A blood pressure cuff, EKG and other monitors will often be applied during the procedure.  Some patients may need to have extra oxygen administered for a short period.  You will be asked to provide medical information, including your allergies, prior to the procedure.  We must know immediately if you are taking blood thinners (like Coumadin/Warfarin) or if you are allergic to IV iodine contrast (dye).  We must know if you could possible be pregnant.  Possible side effects:  Bleeding from needle site Infection (rare, may require surgery) Nerve injury (rare) Numbness & tingling (temporary) A brief convulsion or seizure Light-headedness (temporary) Pain at injection site (several days) Decreased blood pressure (temporary) Weakness in the leg (temporary)   Call if you experience:  New onset weakness or numbness of an extremity below the injection site that last more than 8 hours. Hives or difficulty breathing ( go to the emergency room) Inflammation or drainage at the injection site Any new symptoms which are concerning to  you  Please note:  Although the local anesthetic injected can often make your back/ hip/ buttock/ leg feel good for several hours after the injections, the pain will likely return.  It takes 3-7 days for steroids to work in the sacroiliac area.  You may not notice any pain relief for at least that one week.  If effective, we will often do a series of three injections spaced 3-6 weeks apart to maximally decrease your pain.  After the initial series, we generally will wait some months before a repeat injection of the same type.  If you have any questions, please call [phone] [facility]

## 2023-12-03 ENCOUNTER — Telehealth: Payer: Self-pay

## 2023-12-03 NOTE — Telephone Encounter (Signed)
Post procedure follow up. Patient states she is doing fine.  

## 2023-12-18 ENCOUNTER — Ambulatory Visit
Admission: RE | Admit: 2023-12-18 | Discharge: 2023-12-18 | Disposition: A | Discharge status: Home / Self Care | Payer: Medicare Other | Source: Ambulatory Visit | Attending: Student in an Organized Health Care Education/Training Program | Admitting: Student in an Organized Health Care Education/Training Program

## 2023-12-18 ENCOUNTER — Ambulatory Visit
Payer: Medicare Other | Attending: Student in an Organized Health Care Education/Training Program | Admitting: Student in an Organized Health Care Education/Training Program

## 2023-12-18 ENCOUNTER — Encounter: Payer: Self-pay | Admitting: Student in an Organized Health Care Education/Training Program

## 2023-12-18 VITALS — BP 166/85 | HR 69 | Temp 98.1°F | Resp 15 | Ht 67.0 in | Wt 215.0 lb

## 2023-12-18 DIAGNOSIS — G894 Chronic pain syndrome: Secondary | ICD-10-CM | POA: Diagnosis not present

## 2023-12-18 DIAGNOSIS — G5701 Lesion of sciatic nerve, right lower limb: Secondary | ICD-10-CM | POA: Diagnosis not present

## 2023-12-18 DIAGNOSIS — M533 Sacrococcygeal disorders, not elsewhere classified: Secondary | ICD-10-CM | POA: Insufficient documentation

## 2023-12-18 DIAGNOSIS — M461 Sacroiliitis, not elsewhere classified: Secondary | ICD-10-CM | POA: Diagnosis not present

## 2023-12-18 MED ORDER — HYDROCODONE-ACETAMINOPHEN 7.5-325 MG PO TABS: 1.0000 | ORAL_TABLET | Freq: Three times a day (TID) | ORAL | 0 refills | Status: AC | PRN

## 2023-12-18 MED ORDER — LIDOCAINE HCL 2 % IJ SOLN
20.0000 mL | Freq: Once | INTRAMUSCULAR | Status: AC
  Administered 2023-12-18: 100 mg
  Filled 2023-12-18: qty 40

## 2023-12-18 MED ORDER — IOHEXOL 180 MG/ML  SOLN
10.0000 mL | Freq: Once | INTRAMUSCULAR | Status: AC
  Administered 2023-12-18: 10 mL via INTRA_ARTICULAR
  Filled 2023-12-18: qty 20

## 2023-12-18 MED ORDER — HYDROCODONE-ACETAMINOPHEN 7.5-325 MG PO TABS: 1.0000 | ORAL_TABLET | Freq: Three times a day (TID) | ORAL | 0 refills | Status: DC | PRN

## 2023-12-18 MED ORDER — ROPIVACAINE HCL 2 MG/ML IJ SOLN
9.0000 mL | Freq: Once | INTRAMUSCULAR | Status: AC
  Administered 2023-12-18: 9 mL via INTRA_ARTICULAR
  Filled 2023-12-18: qty 20

## 2023-12-18 MED ORDER — METHYLPREDNISOLONE ACETATE 80 MG/ML IJ SUSP
80.0000 mg | Freq: Once | INTRAMUSCULAR | Status: AC
  Administered 2023-12-18: 80 mg via INTRA_ARTICULAR
  Filled 2023-12-18: qty 1

## 2023-12-18 NOTE — Patient Instructions (Signed)
Pain Management Discharge Instructions  General Discharge Instructions :  If you need to reach your doctor call: Monday-Friday 8:00 am - 4:00 pm at [phone] or toll free [phone].  After clinic hours [phone] to have operator reach doctor.  Bring all of your medication bottles to all your appointments in the pain clinic.  To cancel or reschedule your appointment with Pain Management please remember to call 24 hours in advance to avoid a fee.  Refer to the educational materials which you have been given on: General Risks, I had my Procedure. Discharge Instructions, Post Sedation.  Post Procedure Instructions:  The drugs you were given will stay in your system until tomorrow, so for the next 24 hours you should not drive, make any legal decisions or drink any alcoholic beverages.  You may eat anything you prefer, but it is better to start with liquids then soups and crackers, and gradually work up to solid foods.  Please notify your doctor immediately if you have any unusual bleeding, trouble breathing or pain that is not related to your normal pain.  Depending on the type of procedure that was done, some parts of your body may feel week and/or numb.  This usually clears up by tonight or the next day.  Walk with the use of an assistive device or accompanied by an adult for the 24 hours.  You may use ice on the affected area for the first 24 hours.  Put ice in a Ziploc bag and cover with a towel and place against area 15 minutes on 15 minutes off.  You may switch to heat after 24 hours.Sacroiliac (SI) Joint Injection Patient Information  Description: The sacroiliac joint connects the scrum (very low back and tailbone) to the ilium (a pelvic bone which also forms half of the hip joint).  Normally this joint experiences very little motion.  When this joint becomes inflamed or unstable low back and or hip and pelvis pain may result.  Injection of this joint with local anesthetics  (numbing medicines) and steroids can provide diagnostic information and reduce pain.  This injection is performed with the aid of x-ray guidance into the tailbone area while you are lying on your stomach.   You may experience an electrical sensation down the leg while this is being done.  You may also experience numbness.  We also may ask if we are reproducing your normal pain during the injection.  Conditions which may be treated SI injection:  Low back, buttock, hip or leg pain  Preparation for the Injection:  Do not eat any solid food or dairy products within 8 hours of your appointment.  You may drink clear liquids up to 3 hours before appointment.  Clear liquids include water, black coffee, juice or soda.  No milk or cream please. You may take your regular medications, including pain medications with a sip of water before your appointment.  Diabetics should hold regular insulin (if take separately) and take 1/2 normal NPH dose the morning of the procedure.  Carry some sugar containing items with you to your appointment. A driver must accompany you and be prepared to drive you home after your procedure. Bring all of your current medications with you. An IV may be inserted and sedation may be given at the discretion of the physician. A blood pressure cuff, EKG and other monitors will often be applied during the procedure.  Some patients may need to have extra oxygen administered for a short period.  You will   be asked to provide medical information, including your allergies, prior to the procedure.  We must know immediately if you are taking blood thinners (like Coumadin/Warfarin) or if you are allergic to IV iodine contrast (dye).  We must know if you could possible be pregnant.  Possible side effects:  Bleeding from needle site Infection (rare, may require surgery) Nerve injury (rare) Numbness & tingling (temporary) A brief convulsion or seizure Light-headedness (temporary) Pain at  injection site (several days) Decreased blood pressure (temporary) Weakness in the leg (temporary)   Call if you experience:  New onset weakness or numbness of an extremity below the injection site that last more than 8 hours. Hives or difficulty breathing ( go to the emergency room) Inflammation or drainage at the injection site Any new symptoms which are concerning to you  Please note:  Although the local anesthetic injected can often make your back/ hip/ buttock/ leg feel good for several hours after the injections, the pain will likely return.  It takes 3-7 days for steroids to work in the sacroiliac area.  You may not notice any pain relief for at least that one week.  If effective, we will often do a series of three injections spaced 3-6 weeks apart to maximally decrease your pain.  After the initial series, we generally will wait some months before a repeat injection of the same type.  If you have any questions, please call [phone] [facility]   

## 2023-12-18 NOTE — Progress Notes (Signed)
PROVIDER NOTE: Interpretation of information contained herein should be left to medically-trained personnel. Specific patient instructions are provided elsewhere under "Patient Instructions" section of medical record. This document was created in part using STT-dictation technology, any transcriptional errors that may result from this process are unintentional.  Patient: Sandy Byrd Type: Established DOB: [birth year] MRN: [id number] PCP: Enid Baas, MD  Service: Procedure DOS: 12/18/2023 Setting: Ambulatory Location: Ambulatory outpatient facility Delivery: Face-to-face Provider: Edward Jolly, MD Specialty: Interventional Pain Management Specialty designation: 09 Location: Outpatient facility Ref. Prov.: Enid Baas, MD       Interventional Therapy   ProcedureSacroiliac Joint Steroid Injection #2 and right piriformis TPI  #2 Laterality: Right     Level: PIIS (Posterior inferior iliac Spine)  Imaging: Fluoroscopic guidance Anesthesia: Local anesthesia (1-2% Lidocaine) DOS: 12/18/2023  Performed by: Edward Jolly, MD  Purpose: Diagnostic/Therapeutic Indications: Sacroiliac joint pain in the lower back and hip area severe enough to impact quality of life or function. Rationale (medical necessity): procedure needed and proper for the diagnosis and/or treatment of Sandy Byrd's medical symptoms and needs. 1. SI joint arthritis (HCC)   2. Sacroiliac joint pain   3. Piriformis syndrome of right side   4. Chronic pain syndrome    NAS-11 Pain score:   Pre-procedure: 8 /10   Post-procedure: 8 /10     Target: Interarticular sacroiliac joint. Location: Medial to the postero-medial edge of iliac spine. Region: Lumbosacral-sacrococcygeal. Approach: Inferior postero-medial percutaneous approach. Type of procedure: Percutaneous joint injection.  Position / Prep / Materials:  Position: Prone  Prep solution: DuraPrep (Iodine Povacrylex [0.7% available iodine] and  Isopropyl Alcohol, 74% w/w) Prep Area: Entire posterior lumbosacral area  Materials:  Tray: Block Needle(s):  Type: Spinal  Gauge (G): 22  Length: 3.5-in Qty: 1  H&P (Pre-op Assessment):  Sandy Byrd is a 68 y.o. (year old), female patient, seen today for interventional treatment. She  has a past surgical history that includes Shoulder surgery (Right, 2016) and Shoulder surgery (Right, 2017). Sandy Byrd has a current medication list which includes the following prescription(s): acetaminophen, alprazolam, ascorbic acid, baclofen, benzonatate, celecoxib, cyanocobalamin, cyclosporine, epinephrine, ergocalciferol, escitalopram, fluticasone, folic acid, furosemide, ketoconazole, levothyroxine, lidocaine, magnesium, metronidazole, potassium chloride sa, pregabalin, simvastatin, triamcinolone cream, triamterene-hydrochlorothiazide, turmeric, ventolin hfa, vitamin d (ergocalciferol), zolpidem, [START ON 01/10/2024] hydrocodone-acetaminophen, [START ON 02/09/2024] hydrocodone-acetaminophen, and [START ON 03/10/2024] hydrocodone-acetaminophen. Her primarily concern today is the Back Pain (Lower and right knee)  Initial Vital Signs:  Pulse/HCG Rate: 69ECG Heart Rate: (!) 59 Temp: 98.1 F (36.7 C) Resp: 17 BP: (!) 142/72 SpO2: 98 %  BMI: Estimated body mass index is 33.67 kg/m as calculated from the following:   Height as of this encounter: 5\' 7"  (1.702 m).   Weight as of this encounter: 215 lb (97.5 kg).  Risk Assessment: Allergies: Reviewed. She is allergic to cephalexin, cephalosporins, prochlorperazine, shrimp extract, bee venom, latex, other, and silicone.  Allergy Precautions: None required Coagulopathies: Reviewed. None identified.  Blood-thinner therapy: None at this time Active Infection(s): Reviewed. None identified. Sandy Byrd is afebrile  Site Confirmation: Sandy Byrd was asked to confirm the procedure and laterality before marking the site Procedure checklist:  Completed Consent: Before the procedure and under the influence of no sedative(s), amnesic(s), or anxiolytics, the patient was informed of the treatment options, risks and possible complications. To fulfill our ethical and legal obligations, as recommended by the American Medical Association's Code of Ethics, I have informed the patient of my clinical impression; the nature and purpose of the  treatment or procedure; the risks, benefits, and possible complications of the intervention; the alternatives, including doing nothing; the risk(s) and benefit(s) of the alternative treatment(s) or procedure(s); and the risk(s) and benefit(s) of doing nothing. The patient was provided information about the general risks and possible complications associated with the procedure. These may include, but are not limited to: failure to achieve desired goals, infection, bleeding, organ or nerve damage, allergic reactions, paralysis, and death. In addition, the patient was informed of those risks and complications associated to the procedure, such as failure to decrease pain; infection; bleeding; organ or nerve damage with subsequent damage to sensory, motor, and/or autonomic systems, resulting in permanent pain, numbness, and/or weakness of one or several areas of the body; allergic reactions; (i.e.: anaphylactic reaction); and/or death. Furthermore, the patient was informed of those risks and complications associated with the medications. These include, but are not limited to: allergic reactions (i.e.: anaphylactic or anaphylactoid reaction(s)); adrenal axis suppression; blood sugar elevation that in diabetics may result in ketoacidosis or comma; water retention that in patients with history of congestive heart failure may result in shortness of breath, pulmonary edema, and decompensation with resultant heart failure; weight gain; swelling or edema; medication-induced neural toxicity; particulate matter embolism and blood vessel  occlusion with resultant organ, and/or nervous system infarction; and/or aseptic necrosis of one or more joints. Finally, the patient was informed that Medicine is not an exact science; therefore, there is also the possibility of unforeseen or unpredictable risks and/or possible complications that may result in a catastrophic outcome. The patient indicated having understood very clearly. We have given the patient no guarantees and we have made no promises. Enough time was given to the patient to ask questions, all of which were answered to the patient's satisfaction. Sandy Byrd has indicated that she wanted to continue with the procedure. Attestation: I, the ordering provider, attest that I have discussed with the patient the benefits, risks, side-effects, alternatives, likelihood of achieving goals, and potential problems during recovery for the procedure that I have provided informed consent. Date  Time: 12/18/2023 10:01 AM  Pre-Procedure Preparation:  Monitoring: As per clinic protocol. Respiration, ETCO2, SpO2, BP, heart rate and rhythm monitor placed and checked for adequate function Safety Precautions: Patient was assessed for positional comfort and pressure points before starting the procedure. Time-out: I initiated and conducted the "Time-out" before starting the procedure, as per protocol. The patient was asked to participate by confirming the accuracy of the "Time Out" information. Verification of the correct person, site, and procedure were performed and confirmed by me, the nursing staff, and the patient. "Time-out" conducted as per Joint Commission's Universal Protocol (UP.01.01.01). Time: 1111 Start Time: 1111 hrs.  Description/Narrative of Procedure:          Start Time: 1111 hrs.  Rationale (medical necessity): procedure needed and proper for the diagnosis and/or treatment of the patient's medical symptoms and needs. Procedural Technique Safety Precautions: Aspiration looking for  blood return was conducted prior to all injections. At no point did we inject any substances, as a needle was being advanced. No attempts were made at seeking any paresthesias. Safe injection practices and needle disposal techniques used. Medications properly checked for expiration dates. SDV (single dose vial) medications used. Description of the Procedure: Protocol guidelines were followed. The patient was assisted into a comfortable position. The target area was identified and the area prepped in the usual manner. Skin & deeper tissues infiltrated with local anesthetic. Appropriate amount of time allowed to pass  for local anesthetics to take effect. The procedure needles were then advanced to the target area. Proper needle placement secured. Negative aspiration confirmed. Solution injected in intermittent fashion, asking for systemic symptoms every 0.5cc of injectate. The needles were then removed and the area cleansed, making sure to leave some of the prepping solution back to take advantage of its long term bactericidal properties.  Technical description of procedure:  Fluoroscopy using a posterior anterior 45 degree angle from the midline aiming at the anterolateral aspect of the patient was used to find a direct path into the sacroiliac joint, the superior medial to posterior superior iliac spine.  The skin was marked where the desired target and the skin infiltrated with local anesthetics.  The procedure needle was then advanced until the joint was entered.  Once inside of the joint, we then proceeded to inject the desired solution.  10 cc solution made of 9 cc of 0.2% ropivacaine, 1 cc of methylprednisolone, 80 mg/cc.  5 cc injected into the right SI joint after contrast confirmation.  Afterwards a right piriformis trigger point injection was done 1 cm inferior, 1 cm deep, 1 cm lateral to the inferior fissure of the SI joint.  Contrast was injected to confirm piriformis muscle striation.  5 cc  injected into the right piriformis muscle.  While injecting, patient did not complain of any pain radiating down her leg.   Vitals:   12/18/23 1005 12/18/23 1012 12/18/23 1108 12/18/23 1113  BP:  (!) 142/72 127/72 (!) 166/85  Pulse: 69     Resp:   17 15  Temp: 98.1 F (36.7 C)     SpO2: 98%  98% 98%  Weight: 215 lb (97.5 kg)     Height: 5\' 7"  (1.702 m)        End Time: 1113 hrs.  Imaging Guidance (Non-Spinal):          Type of Imaging Technique: Fluoroscopy Guidance (Non-Spinal) Indication(s): Assistance in needle guidance and placement for procedures requiring needle placement in or near specific anatomical locations not easily accessible without such assistance. Exposure Time: Please see nurses notes. Contrast: None used. Fluoroscopic Guidance: I was personally present during the use of fluoroscopy. "Tunnel Vision Technique" used to obtain the best possible view of the target area. Parallax error corrected before commencing the procedure. "Direction-depth-direction" technique used to introduce the needle under continuous pulsed fluoroscopy. Once target was reached, antero-posterior, oblique, and lateral fluoroscopic projection used confirm needle placement in all planes. Images permanently stored in EMR. Interpretation: No contrast injected. I personally interpreted the imaging intraoperatively. Adequate needle placement confirmed in multiple planes. Permanent images saved into the patient's record.  Post-operative Assessment:  Post-procedure Vital Signs:  Pulse/HCG Rate: 6963 Temp: 98.1 F (36.7 C) Resp: 15 BP: (!) 166/85 SpO2: 98 %  EBL: None  Complications: No immediate post-treatment complications observed by team, or reported by patient.  Note: The patient tolerated the entire procedure well. A repeat set of vitals were taken after the procedure and the patient was kept under observation following institutional policy, for this type of procedure. Post-procedural  neurological assessment was performed, showing return to baseline, prior to discharge. The patient was provided with post-procedure discharge instructions, including a section on how to identify potential problems. Should any problems arise concerning this procedure, the patient was given instructions to immediately contact us, at any time, without hesitation. In any case, we plan to contact the patient by telephone for a follow-up status report regarding this interventional procedure.  Comments:  No additional relevant information.  Plan of Care (POC)  Orders:  Orders Placed This Encounter  Procedures   DG PAIN CLIN[facility] C-ARM 1-60 MIN NO REPORT    Intraoperative interpretation by procedural physician at [facility].    Standing Status:   Standing    Number of Occurrences:   1    Reason for exam::   Assistance in needle guidance and placement for procedures requiring needle placement in or near specific anatomical locations not easily accessible without such assistance.   Patient has an upcoming trip to [place] where she will be walking much more.  She is requesting a slight increase in her hydrocodone given the potential for increased pain.  She also states that her current dose is not very effective especially in the winter months as she usually has increased pain at night and is hoping to have a third dose that she can take in the evening.  Chronic Opioid Analgesic:  Norco 7.5 mg BID --> 7.5 mg TID prn     Pharmacotherapy Assessment  Analgesic: Norco 7.5 mg BID-->TID prn   Monitoring: [place] PMP: PDMP not reviewed this encounter.       Pharmacotherapy: No side-effects or adverse reactions reported. Compliance: No problems identified. Effectiveness: Clinically acceptable.  UDS:  Summary  Date Value Ref Range Status  10/01/2023 Note  Final    Comment:    ==================================================================== [employer] Select 13  (MW) ==================================================================== Test                             Result       Flag       Units  Drug Present and Declared for Prescription Verification   Hydrocodone                    366          EXPECTED   ng/mg creat   Hydromorphone                  82           EXPECTED   ng/mg creat   Dihydrocodeine                 98           EXPECTED   ng/mg creat   Norhydrocodone                 624          EXPECTED   ng/mg creat    Sources of hydrocodone include scheduled prescription medications.    Hydromorphone, dihydrocodeine and norhydrocodone are expected    metabolites of hydrocodone. Hydromorphone and dihydrocodeine are    also available as scheduled prescription medications.  ==================================================================== Test                      Result    Flag   Units      Ref Range   Creatinine              85               mg/dL      >=09 ==================================================================== Declared Medications:  The flagging and interpretation on this report are based on the  following declared medications.  Unexpected results may arise from  inaccuracies in the declared medications.   **Note: The testing scope of this panel includes these medications:  Hydrocodone (Norco)   **Note: The testing scope of this panel does not include the  following reported medications:   Acetaminophen  Acetaminophen (Norco)  Albuterol  Baclofen (Lioresal)  Benzonatate (Tessalon)  Celecoxib (Celebrex)  Cyclosporine (Restasis)  Epinephrine (EpiPen)  Escitalopram (Lexapro)  Fluticasone (Flonase)  Folic Acid  Furosemide (Lasix)  Hydrochlorothiazide (Dyazide)  Ketoconazole (Nizoral)  Levothyroxine (Synthroid)  Magnesium  Metronidazole (MetroGel)  Potassium  Pregabalin (Lyrica)  Simvastatin (Zocor)  Topical Lidocaine (Lidoderm)  Triamcinolone (Kenalog)  Triamterene (Dyazide)  Turmeric  Vitamin C   Vitamin D  Vitamin D2 (Drisdol)  Zolpidem (Ambien) ==================================================================== For clinical consultation, please call [phone]. ====================================================================       Medications ordered for procedure: Meds ordered this encounter  Medications   iohexol (OMNIPAQUE) 180 MG/ML injection 10 mL    Must be Myelogram-compatible. If not available, you may substitute with a water-soluble, non-ionic, hypoallergenic, myelogram-compatible radiological contrast medium.   lidocaine (XYLOCAINE) 2 % (with pres) injection 400 mg   ropivacaine (PF) 2 mg/mL (0.2%) (NAROPIN) injection 9 mL   methylPREDNISolone acetate (DEPO-MEDROL) injection 80 mg   HYDROcodone-acetaminophen (NORCO) 7.5-325 MG tablet    Sig: Take 1 tablet by mouth every 8 (eight) hours as needed.    Dispense:  90 tablet    Refill:  0   HYDROcodone-acetaminophen (NORCO) 7.5-325 MG tablet    Sig: Take 1 tablet by mouth every 8 (eight) hours as needed.    Dispense:  90 tablet    Refill:  0   HYDROcodone-acetaminophen (NORCO) 7.5-325 MG tablet    Sig: Take 1 tablet by mouth every 8 (eight) hours as needed.    Dispense:  90 tablet    Refill:  0   Medications administered: We administered iohexol, lidocaine, ropivacaine (PF) 2 mg/mL (0.2%), and methylPREDNISolone acetate.  See the medical record for exact dosing, route, and time of administration.  Follow-up plan:   Return in about 4 months (around 04/09/2024) for MM, F2F.       Recent Visits Date Type Provider Dept  12/02/23 Procedure visit Edward Jolly, MD [facility]  10/09/23 Procedure visit Edward Jolly, MD [facility]  10/01/23 Office Visit Edward Jolly, MD [facility]  Showing recent visits within past 90 days and meeting all other requirements Today's Visits Date Type Provider Dept  12/18/23 Procedure visit Edward Jolly, MD [facility]  Showing  today's visits and meeting all other requirements Future Appointments No visits were found meeting these conditions. Showing future appointments within next 90 days and meeting all other requirements  Disposition: Discharge home  Discharge (Date  Time): 12/18/2023; 1125 hrs.   Primary Care Physician: Enid Baas, MD Location: [facility] Note by: Edward Jolly, MD (TTS technology used. I apologize for any typographical errors that were not detected and corrected.) Date: 12/18/2023; Time: 11:55 AM  Disclaimer:  Medicine is not an Visual merchandiser. The only guarantee in medicine is that nothing is guaranteed. It is important to note that the decision to proceed with this intervention was based on the information collected from the patient. The Data and conclusions were drawn from the patient's questionnaire, the interview, and the physical examination. Because the information was provided in large part by the patient, it cannot be guaranteed that it has not been purposely or unconsciously manipulated. Every effort has been made to obtain as much relevant data as possible for this evaluation. It is important to note that the conclusions that lead to this procedure are  derived in large part from the available data. Always take into account that the treatment will also be dependent on availability of resources and existing treatment guidelines, considered by other Pain Management Practitioners as being common knowledge and practice, at the time of the intervention. For Medico-Legal purposes, it is also important to point out that variation in procedural techniques and pharmacological choices are the acceptable norm. The indications, contraindications, technique, and results of the above procedure should only be interpreted and judged by a Board-Certified Interventional Pain Specialist with extensive familiarity and expertise in the same exact procedure and technique.

## 2023-12-18 NOTE — Progress Notes (Signed)
Safety precautions to be maintained throughout the outpatient stay will include: orient to surroundings, keep bed in low position, maintain call bell within reach at all times, provide assistance with transfer out of bed and ambulation.   Nursing Pain Medication Assessment:  Safety precautions to be maintained throughout the outpatient stay will include: orient to surroundings, keep bed in low position, maintain call bell within reach at all times, provide assistance with transfer out of bed and ambulation.  Medication Inspection Compliance: Pill count conducted under aseptic conditions, in front of the patient. Neither the pills nor the bottle was removed from the patient's sight at any time. Once count was completed pills were immediately returned to the patient in their original bottle.  Medication: Hydrocodone/APAP Pill/Patch Count:  43 of 60 pills remain Pill/Patch Appearance: Markings consistent with prescribed medication Bottle Appearance: Standard pharmacy container. Clearly labeled. Filled Date: 59 / 10 / 2024 Last Medication intake:  Today

## 2023-12-19 ENCOUNTER — Telehealth: Payer: Self-pay

## 2023-12-19 ENCOUNTER — Encounter: Payer: Medicare Other | Admitting: Student in an Organized Health Care Education/Training Program

## 2023-12-19 NOTE — Telephone Encounter (Signed)
Post procedure follow up.  Patient states she is doing good.  

## 2024-01-21 ENCOUNTER — Telehealth: Payer: Self-pay | Admitting: Student in an Organized Health Care Education/Training Program

## 2024-01-21 DIAGNOSIS — M47816 Spondylosis without myelopathy or radiculopathy, lumbar region: Secondary | ICD-10-CM

## 2024-01-21 NOTE — Telephone Encounter (Signed)
PT called stated that she had discuss with Lateef about getting a RFA done her right side. PT wanted to see if he will place the order .

## 2024-01-21 NOTE — Telephone Encounter (Signed)
Spoke with patient and went over pre-procedure instructions for upcoming RFA. Patient aware she will receive a phone call form Alona Bene to discuss insurance and schedule appointment if approved.

## 2024-01-21 NOTE — Telephone Encounter (Signed)
Call to patient to gather more information. Patient states she would like RFA procedure on L4 and L5 at the right lower back. States her pain radiates from right lower back down to right hip and right leg to the knee. Informed patient that I will notify Dr. Cherylann Ratel. Patient states she would like procedure before 02/05/24 as she plans go to to [place] for a few months.

## 2024-02-12 IMAGING — US US CAROTID DUPLEX BILAT
1 series · 13 of 24 positions shown · non-contrast
Comparison: None Available.

CLINICAL DATA: History of multiple strokes. History of hypertension
and hyperlipidemia.

EXAM:
BILATERAL CAROTID DUPLEX ULTRASOUND
TECHNIQUE: Gray scale imaging, color Doppler and duplex ultrasound were
performed of bilateral carotid and vertebral arteries in the neck.

[Series 1: us carotid bilateral · 13 of 62 slices shown]
[im 1/62]
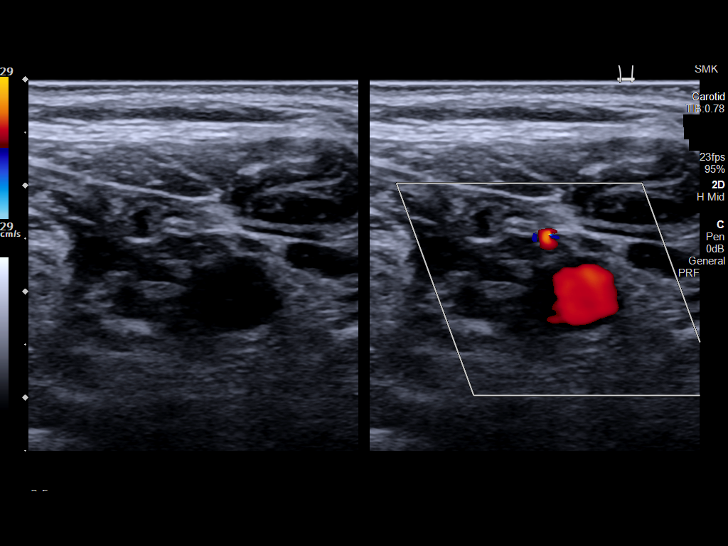
[im 6/62]
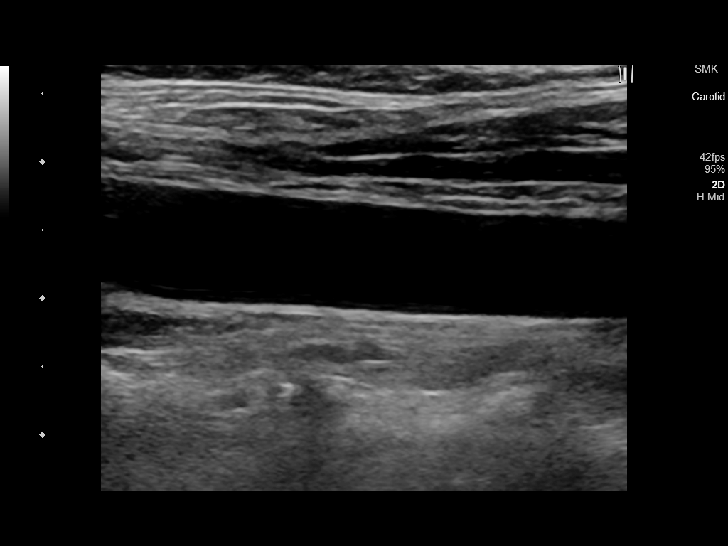
[im 11/62]
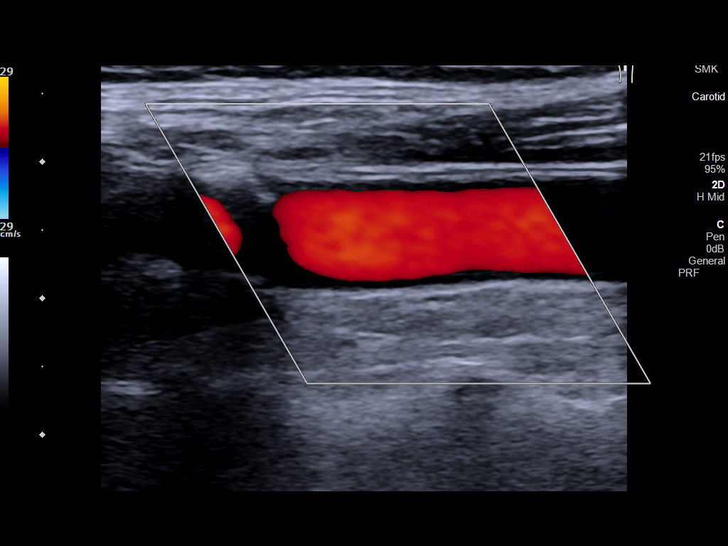
[im 16/62]
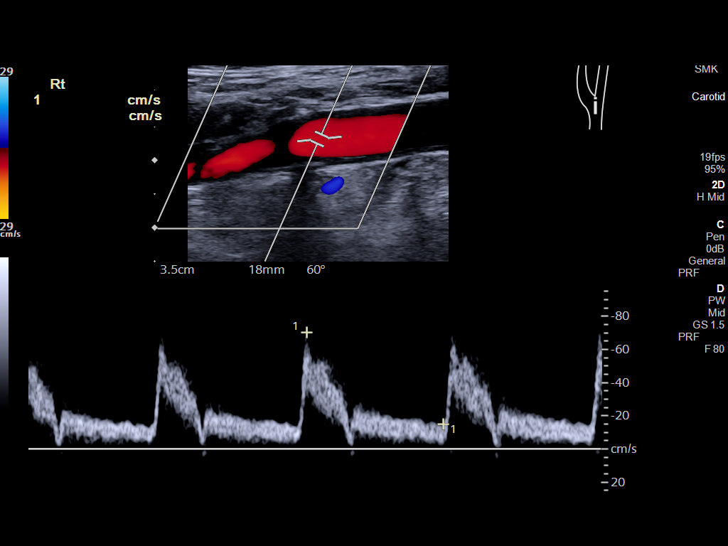
[im 22/62]
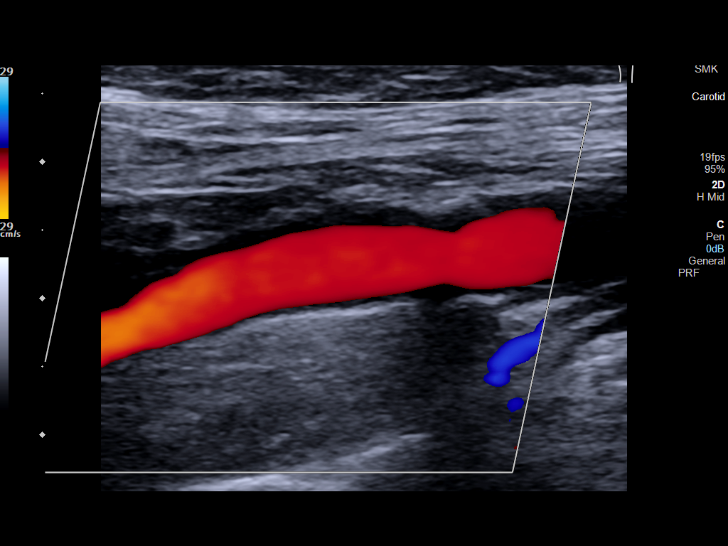
[im 27/62]
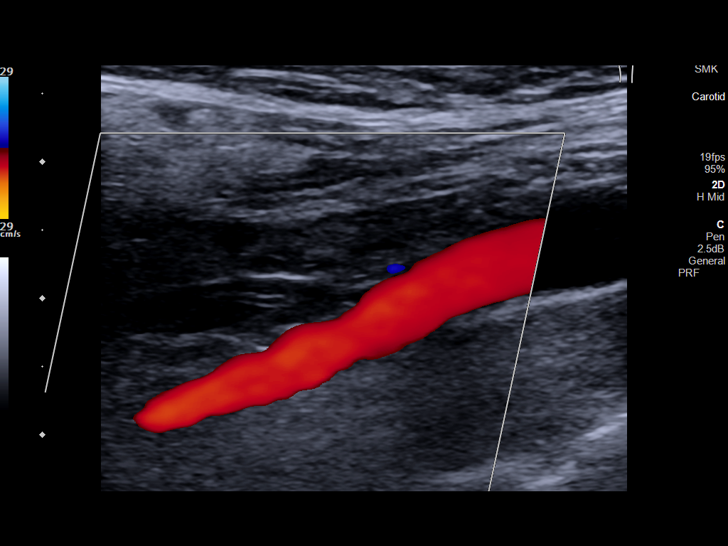
[im 32/62]
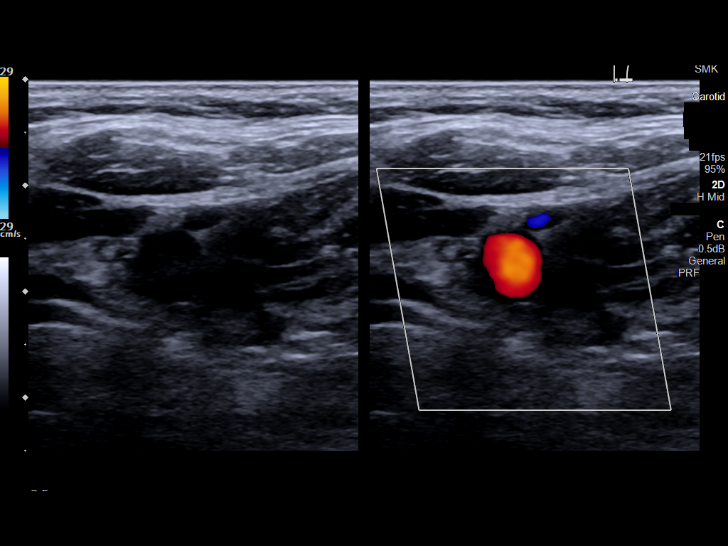
[im 35/62]
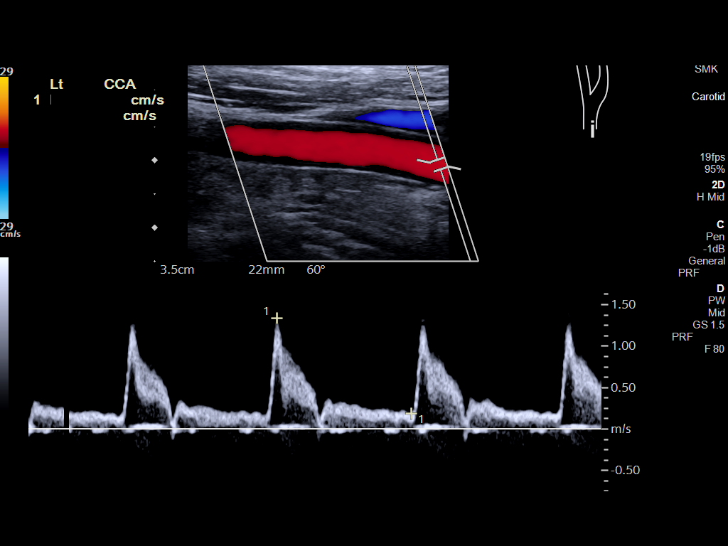
[im 40/62]
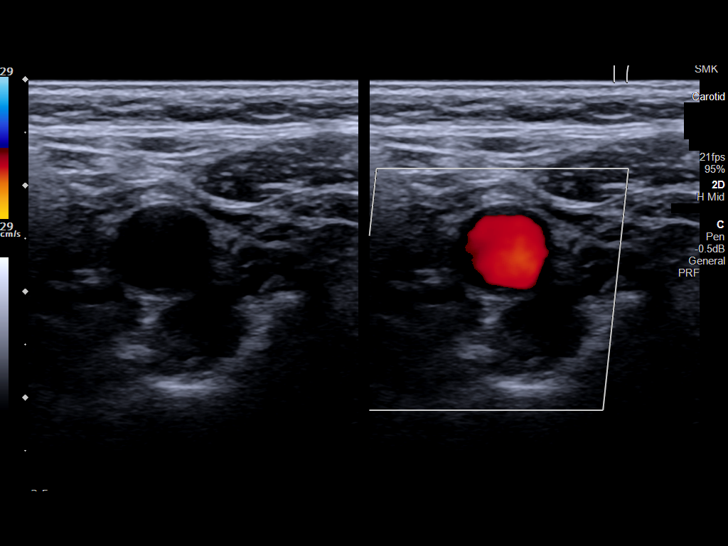
[im 46/62]
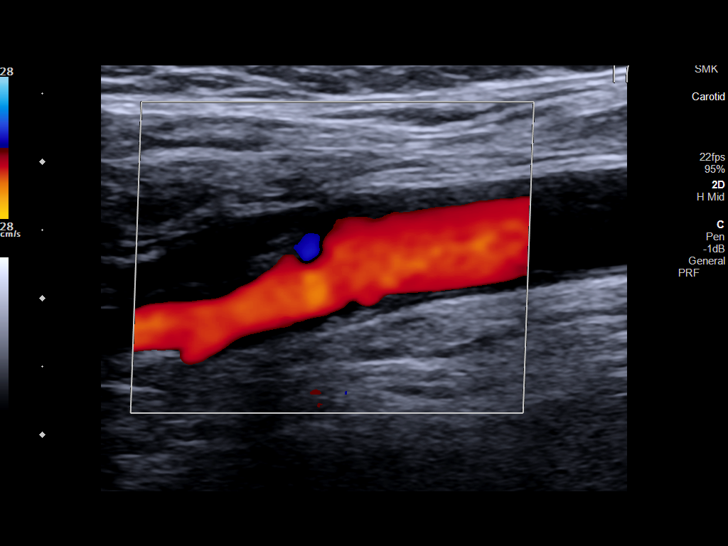
[im 51/62]
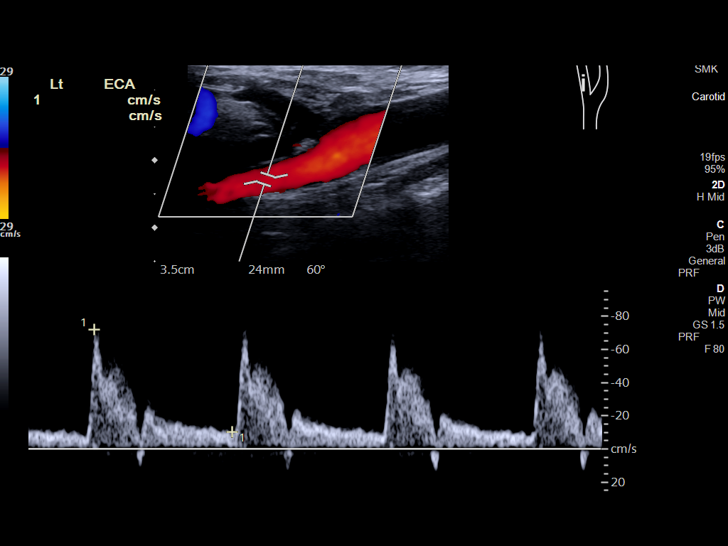
[im 56/62]
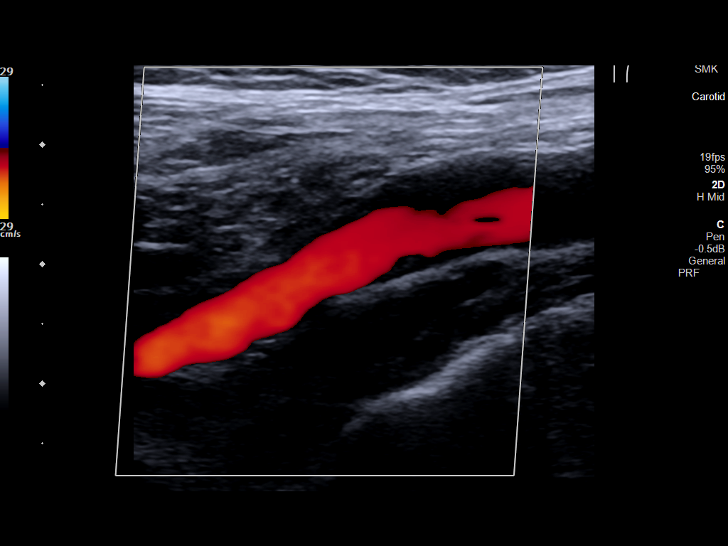
[im 62/62]
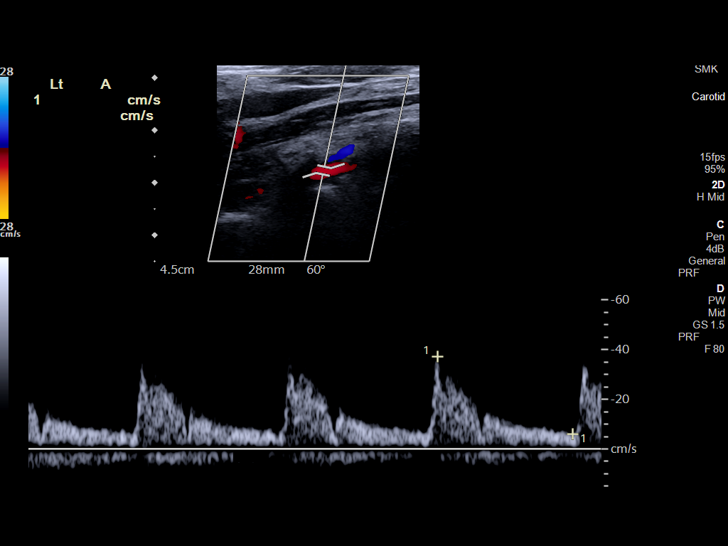

[13 of 24 positions shown; findings below may reference images not displayed]

FINDINGS: Criteria: Quantification of carotid stenosis is based on velocity
parameters that correlate the residual internal carotid diameter
with NASCET-based stenosis levels, using the diameter of the distal
internal carotid lumen as the denominator for stenosis measurement.

The following velocity measurements were obtained:

RIGHT

ICA: 78/19 cm/sec

CCA: 69/12 cm/sec

SYSTOLIC ICA/CCA RATIO:

ECA: 78 cm/sec

LEFT

ICA: 82/21 cm/sec

CCA: 94/13 cm/sec

SYSTOLIC ICA/CCA RATIO:

ECA: 72 cm/sec

RIGHT CAROTID ARTERY: There is a minimal to moderate amount of
eccentric echogenic plaque within the right carotid bulb (image 15),
extending to involve the origin and proximal aspects the right
internal carotid artery (image 22), not resulting in elevated peak
systolic velocities within the interrogated course of the right
internal carotid artery to suggest a hemodynamically significant
stenosis.

RIGHT VERTEBRAL ARTERY:  Antegrade Flow

LEFT CAROTID ARTERY: There is a minimal amount of eccentric
echogenic plaque within the left carotid bulb (image 46). There is a
minimal amount of eccentric mixed echogenic plaque involving the
proximal aspect of the left internal carotid artery (image 56), not
resulting in elevated peak systolic velocities within the
interrogated course of the left internal carotid artery to suggest a
hemodynamically significant stenosis.

LEFT VERTEBRAL ARTERY:  Antegrade Flow
IMPRESSION: Minimal to moderate amount of bilateral atherosclerotic plaque,
right subjectively greater than left, not resulting in a
hemodynamically significant stenosis within either internal carotid
artery.

## 2024-03-04 ENCOUNTER — Ambulatory Visit
Payer: Medicare Other | Attending: Student in an Organized Health Care Education/Training Program | Admitting: Student in an Organized Health Care Education/Training Program

## 2024-03-04 ENCOUNTER — Encounter: Payer: Self-pay | Admitting: Student in an Organized Health Care Education/Training Program

## 2024-03-04 ENCOUNTER — Ambulatory Visit
Admission: RE | Admit: 2024-03-04 | Discharge: 2024-03-04 | Disposition: A | Discharge status: Home / Self Care | Source: Ambulatory Visit | Attending: Student in an Organized Health Care Education/Training Program | Admitting: Student in an Organized Health Care Education/Training Program

## 2024-03-04 DIAGNOSIS — M47816 Spondylosis without myelopathy or radiculopathy, lumbar region: Secondary | ICD-10-CM | POA: Insufficient documentation

## 2024-03-04 MED ORDER — LIDOCAINE HCL 2 % IJ SOLN
20.0000 mL | Freq: Once | INTRAMUSCULAR | Status: AC
  Administered 2024-03-04: 400 mg
  Filled 2024-03-04: qty 40

## 2024-03-04 MED ORDER — DEXAMETHASONE SODIUM PHOSPHATE 10 MG/ML IJ SOLN
10.0000 mg | Freq: Once | INTRAMUSCULAR | Status: AC
  Administered 2024-03-04: 10 mg
  Filled 2024-03-04: qty 1

## 2024-03-04 MED ORDER — ROPIVACAINE HCL 2 MG/ML IJ SOLN
9.0000 mL | Freq: Once | INTRAMUSCULAR | Status: AC
  Administered 2024-03-04: 9 mL via PERINEURAL
  Filled 2024-03-04: qty 20

## 2024-03-04 NOTE — Progress Notes (Signed)
 PROVIDER NOTE: Interpretation of information contained herein should be left to medically-trained personnel. Specific patient instructions are provided elsewhere under "Patient Instructions" section of medical record. This document was created in part using STT-dictation technology, any transcriptional errors that may result from this process are unintentional.  Patient: Sandy Byrd Type: Established DOB: May 08, 1955 MRN: [id number] PCP: Enid Baas, MD  Service: Procedure DOS: 03/04/2024 Setting: Ambulatory Location: Ambulatory outpatient facility Delivery: Face-to-face Provider: Edward Jolly, MD Specialty: Interventional Pain Management Specialty designation: 09 Location: Outpatient facility Ref. Prov.: Enid Baas, MD    Primary Reason for Visit: Interventional Pain Management Treatment. CC: Back Pain (R>L)  Procedure #1:   Type: Lumbar Facet, Medial Branch Radiofrequency Ablation (RFA) #3  Laterality: Right  Level: L3, L4, L5, Medial Branch Level(s). These levels will denervate the L3-4 and L5-S1 lumbar facet joints.  Imaging: Fluoroscopic guidance Anesthesia: Local anesthesia (1-2% Lidocaine) Anxiolysis: none DOS: 03/04/2024  Performed by: Edward Jolly, MD  Purpose: Therapeutic/Palliative Indications: Low back pain severe enough to impact quality of life or function. Indications: 1. Lumbar spondylosis   2. Lumbar facet joint syndrome    [name] has been dealing with the above chronic pain for longer than three months and has either failed to respond, was unable to tolerate, or simply did not get enough benefit from other more conservative therapies including, but not limited to: 1. Over-the-counter medications 2. Anti-inflammatory medications 3. Muscle relaxants 4. Membrane stabilizers 5. Opioids 6. Physical therapy and/or chiropractic manipulation 7. Modalities (Heat, ice, etc.) 8. Invasive techniques such as nerve blocks. Ms. Well has  attained more than 50% relief of the pain from a series of diagnostic injections conducted in separate occasions.  Pain Score: Pre-procedure: 7 /10 Post-procedure: 7 /10     Position / Prep / Materials:  Position: Prone  Prep solution: DuraPrep (Iodine Povacrylex [0.7% available iodine] and Isopropyl Alcohol, 74% w/w) Prep Area: Entire Lumbosacral Region (Lower back from mid-thoracic region to end of tailbone and from flank to flank.) Materials:  Tray: RFA (Radiofrequency) tray Needle(s):  Type: RFA (Teflon-coated radiofrequency ablation needles) Gauge (G): 22  Length: Regular (10cm) Qty: 3  Pre-op H&P Assessment:  Sandy Byrd is a 69 y.o. (year old), female patient, seen today for interventional treatment. She  has a past surgical history that includes Shoulder surgery (Right, 2016) and Shoulder surgery (Right, 2017). Sandy Byrd has a current medication list which includes the following prescription(s): acetaminophen, alprazolam, ascorbic acid, baclofen, benzonatate, celecoxib, cyanocobalamin, cyclosporine, doxycycline, epinephrine, ergocalciferol, escitalopram, fluticasone, folic acid, furosemide, hydrocodone-acetaminophen, [START ON 03/10/2024] hydrocodone-acetaminophen, ketoconazole, levothyroxine, lidocaine, magnesium, metronidazole, pilocarpine, potassium chloride sa, prednisone, pregabalin, simvastatin, triamcinolone cream, triamterene-hydrochlorothiazide, turmeric, ventolin hfa, vitamin d (ergocalciferol), and zolpidem. Her primarily concern today is the Back Pain (R>L)  Initial Vital Signs:  Pulse/HCG Rate: 72ECG Heart Rate: 63 Temp:  97.6 F (36.4 C) Resp: 16 BP: 129/79 SpO2: 97 %  BMI: Estimated body mass index is 34.46 kg/m as calculated from the following:   Height as of this encounter: 5\' 7"  (1.702 m).   Weight as of this encounter: 220 lb (99.8 kg).  Risk Assessment: Allergies: Reviewed. She is allergic to cephalexin, cephalosporins, prochlorperazine, shrimp  extract, bee venom, latex, other, and silicone.  Allergy Precautions: None required Coagulopathies: Reviewed. None identified.  Blood-thinner therapy: None at this time Active Infection(s): Reviewed. None identified. [name] is afebrile  Site Confirmation: Sandy Byrd was asked to confirm the procedure and laterality before marking the site Procedure checklist: Completed Consent: Before the procedure  and under the influence of no sedative(s), amnesic(s), or anxiolytics, the patient was informed of the treatment options, risks and possible complications. To fulfill our ethical and legal obligations, as recommended by the American Medical Association's Code of Ethics, I have informed the patient of my clinical impression; the nature and purpose of the treatment or procedure; the risks, benefits, and possible complications of the intervention; the alternatives, including doing nothing; the risk(s) and benefit(s) of the alternative treatment(s) or procedure(s); and the risk(s) and benefit(s) of doing nothing. The patient was provided information about the general risks and possible complications associated with the procedure. These may include, but are not limited to: failure to achieve desired goals, infection, bleeding, organ or nerve damage, allergic reactions, paralysis, and death. In addition, the patient was informed of those risks and complications associated to Spine-related procedures, such as failure to decrease pain; infection (i.e.: Meningitis, epidural or intraspinal abscess); bleeding (i.e.: epidural hematoma, subarachnoid hemorrhage, or any other type of intraspinal or peri-dural bleeding); organ or nerve damage (i.e.: Any type of peripheral nerve, nerve root, or spinal cord injury) with subsequent damage to sensory, motor, and/or autonomic systems, resulting in permanent pain, numbness, and/or weakness of one or several areas of the body; allergic reactions; (i.e.: anaphylactic  reaction); and/or death. Furthermore, the patient was informed of those risks and complications associated with the medications. These include, but are not limited to: allergic reactions (i.e.: anaphylactic or anaphylactoid reaction(s)); adrenal axis suppression; blood sugar elevation that in diabetics may result in ketoacidosis or comma; water retention that in patients with history of congestive heart failure may result in shortness of breath, pulmonary edema, and decompensation with resultant heart failure; weight gain; swelling or edema; medication-induced neural toxicity; particulate matter embolism and blood vessel occlusion with resultant organ, and/or nervous system infarction; and/or aseptic necrosis of one or more joints. Finally, the patient was informed that Medicine is not an exact science; therefore, there is also the possibility of unforeseen or unpredictable risks and/or possible complications that may result in a catastrophic outcome. The patient indicated having understood very clearly. We have given the patient no guarantees and we have made no promises. Enough time was given to the patient to ask questions, all of which were answered to the patient's satisfaction. Ms. Truby has indicated that she wanted to continue with the procedure. Attestation: I, the ordering provider, attest that I have discussed with the patient the benefits, risks, side-effects, alternatives, likelihood of achieving goals, and potential problems during recovery for the procedure that I have provided informed consent. Date  Time: 03/04/2024  7:55 AM  Pre-Procedure Preparation:  Monitoring: As per clinic protocol. Respiration, ETCO2, SpO2, BP, heart rate and rhythm monitor placed and checked for adequate function Safety Precautions: Patient was assessed for positional comfort and pressure points before starting the procedure. Time-out: I initiated and conducted the "Time-out" before starting the procedure, as  per protocol. The patient was asked to participate by confirming the accuracy of the "Time Out" information. Verification of the correct person, site, and procedure were performed and confirmed by me, the nursing staff, and the patient. "Time-out" conducted as per Joint Commission's Universal Protocol (UP.01.01.01). Time: 4403  Description of Procedure:          Laterality: Right Levels:  L3, L4, L5,Medial Branch Level(s), at the L3-4 and L5-S1 lumbar facet joints. Safety Precautions: Aspiration looking for blood return was conducted prior to all injections. At no point did we inject any substances, as a needle was  being advanced. Before injecting, the patient was told to immediately notify me if she was experiencing any new onset of "ringing in the ears, or metallic taste in the mouth". No attempts were made at seeking any paresthesias. Safe injection practices and needle disposal techniques used. Medications properly checked for expiration dates. SDV (single dose vial) medications used. After the completion of the procedure, all disposable equipment used was discarded in the proper designated medical waste containers. Local Anesthesia: Protocol guidelines were followed. The patient was positioned over the fluoroscopy table. The area was prepped in the usual manner. The time-out was completed. The target area was identified using fluoroscopy. A 12-in long, straight, sterile hemostat was used with fluoroscopic guidance to locate the targets for each level blocked. Once located, the skin was marked with an approved surgical skin marker. Once all sites were marked, the skin (epidermis, dermis, and hypodermis), as well as deeper tissues (fat, connective tissue and muscle) were infiltrated with a small amount of a short-acting local anesthetic, loaded on a 10cc syringe with a 25G, 1.5-in  Needle. An appropriate amount of time was allowed for local anesthetics to take effect before proceeding to the next  step. Technical description of process:  Radiofrequency Ablation (RFA) L3 Medial Branch Nerve RFA: The target area for the L3 medial branch is at the junction of the postero-lateral aspect of the superior articular process and the superior, posterior, and medial edge of the transverse process of L4. Under fluoroscopic guidance, a Radiofrequency needle was inserted until contact was made with os over the superior postero-lateral aspect of the pedicular shadow (target area). Sensory and motor testing was conducted to properly adjust the position of the needle. Once satisfactory placement of the needle was achieved, the numbing solution was slowly injected after negative aspiration for blood. 2.0 mL of the nerve block solution was injected without difficulty or complication. After waiting for at least 3 minutes, the ablation was performed. Once completed, the needle was removed intact. L4 Medial Branch Nerve RFA: The target area for the L4 medial branch is at the junction of the postero-lateral aspect of the superior articular process and the superior, posterior, and medial edge of the transverse process of L5. Under fluoroscopic guidance, a Radiofrequency needle was inserted until contact was made with os over the superior postero-lateral aspect of the pedicular shadow (target area). Sensory and motor testing was conducted to properly adjust the position of the needle. Once satisfactory placement of the needle was achieved, the numbing solution was slowly injected after negative aspiration for blood. 2.0 mL of the nerve block solution was injected without difficulty or complication. After waiting for at least 3 minutes, the ablation was performed. Once completed, the needle was removed intact. L5 Medial Branch Nerve RFA: The target area for the L5 medial branch is at the junction of the postero-lateral aspect of the superior articular process of S1 and the superior, posterior, and medial edge of the sacral ala.  Under fluoroscopic guidance, a Radiofrequency needle was inserted until contact was made with os over the superior postero-lateral aspect of the pedicular shadow (target area). Sensory and motor testing was conducted to properly adjust the position of the needle. Once satisfactory placement of the needle was achieved, the numbing solution was slowly injected after negative aspiration for blood. 2.0 mL of the nerve block solution was injected without difficulty or complication. After waiting for at least 3 minutes, the ablation was performed. Once completed, the needle was removed intact. Radiofrequency lesioning (ablation):  Radiofrequency Generator: Medtronic AccurianTM AG 1000 RF Generator Sensory Stimulation Parameters: 50 Hz was used to locate & identify the nerve, making sure that the needle was positioned such that there was no sensory stimulation below 0.3 V or above 0.7 V. Motor Stimulation Parameters: 2 Hz was used to evaluate the motor component. Care was taken not to lesion any nerves that demonstrated motor stimulation of the lower extremities at an output of less than 2.5 times that of the sensory threshold, or a maximum of 2.0 V. Lesioning Technique Parameters: Standard Radiofrequency settings. (Not bipolar or pulsed.) Temperature Settings: 80 degrees C Lesioning time: 60 seconds Intra-operative Compliance: Compliant  6 cc solution made of 5 cc of 0.2% ropivacaine, 1 cc of Decadron 10 mg/cc.  2 cc injected at each level above on the right after sensorimotor testing, prior to lesioning.  Once the entire procedure was completed, the treated area was cleaned, making sure to leave some of the prepping solution back to take advantage of its long term bactericidal properties.    Illustration of the posterior view of the lumbar spine and the posterior neural structures. Laminae of L2 through S1 are labeled. DPRL5, dorsal primary ramus of L5; DPRS1, dorsal primary ramus of S1; DPR3, dorsal  primary ramus of L3; FJ, facet (zygapophyseal) joint L3-L4; I, inferior articular process of L4; LB1, lateral branch of dorsal primary ramus of L1; IAB, inferior articular branches from L3 medial branch (supplies L4-L5 facet joint); IBP, intermediate branch plexus; MB3, medial branch of dorsal primary ramus of L3; NR3, third lumbar nerve root; S, superior articular process of L5; SAB, superior articular branches from L4 (supplies L4-5 facet joint also); TP3, transverse process of L3.  Vitals:   03/04/24 0853 03/04/24 0857 03/04/24 0902 03/04/24 0907  BP: (!) 128/93 (!) 151/93 (!) 155/92 (!) 142/93  Pulse:      Resp: 18 17 18 17   Temp:      TempSrc:      SpO2: 97% 96% 96% 97%  Weight:      Height:       Start Time: 0855 hrs. End Time: 0910 hrs.  Imaging Guidance (Spinal):          Type of Imaging Technique: Fluoroscopy Guidance (Spinal) Indication(s): Assistance in needle guidance and placement for procedures requiring needle placement in or near specific anatomical locations not easily accessible without such assistance. Exposure Time: Please see nurses notes. Contrast: None used. Fluoroscopic Guidance: I was personally present during the use of fluoroscopy. "Tunnel Vision Technique" used to obtain the best possible view of the target area. Parallax error corrected before commencing the procedure. "Direction-depth-direction" technique used to introduce the needle under continuous pulsed fluoroscopy. Once target was reached, antero-posterior, oblique, and lateral fluoroscopic projection used confirm needle placement in all planes. Images permanently stored in EMR. Interpretation: No contrast injected. I personally interpreted the imaging intraoperatively. Adequate needle placement confirmed in multiple planes. Permanent images saved into the patient's record.  Post-operative Assessment:  Post-procedure Vital Signs:  Pulse/HCG Rate: 7261 Temp:  97.6 F (36.4 C) Resp: 17 BP: (!)  142/93 SpO2: 97 %  EBL: None  Complications: No immediate post-treatment complications observed by team, or reported by patient.  Note: The patient tolerated the entire procedure well. A repeat set of vitals were taken after the procedure and the patient was kept under observation following institutional policy, for this type of procedure. Post-procedural neurological assessment was performed, showing return to baseline, prior to discharge. The patient was provided with  post-procedure discharge instructions, including a section on how to identify potential problems. Should any problems arise concerning this procedure, the patient was given instructions to immediately contact us, at any time, without hesitation. In any case, we plan to contact the patient by telephone for a follow-up status report regarding this interventional procedure.  Comments:  No additional relevant information.  5 out of 5 strength bilateral lower extremity: Plantar flexion, dorsiflexion, knee flexion, knee extension.   Plan of Care  Orders:  Orders Placed This Encounter  Procedures   DG PAIN [facility] C-ARM 1-60 MIN NO REPORT    Intraoperative interpretation by procedural physician at [facility].    Standing Status:   Standing    Number of Occurrences:   1    Reason for exam::   Assistance in needle guidance and placement for procedures requiring needle placement in or near specific anatomical locations not easily accessible without such assistance.   Medications ordered for procedure: Meds ordered this encounter  Medications   lidocaine (XYLOCAINE) 2 % (with pres) injection 400 mg   ropivacaine (PF) 2 mg/mL (0.2%) (NAROPIN) injection 9 mL   dexamethasone (DECADRON) injection 10 mg  D/C Gabapentin  Medications administered: We administered lidocaine, ropivacaine (PF) 2 mg/mL (0.2%), and dexamethasone.  See the medical record for exact dosing, route, and time of administration.  Follow-up plan:    Return for Keep sch. appt.       R L3,4,5 RFA 02/26/22, 08/29/22, 07/31/23, 03/04/24  Recent Visits Date Type Provider Dept  12/18/23 Procedure visit Edward Jolly, MD [facility]  Showing recent visits within past 90 days and meeting all other requirements Today's Visits Date Type Provider Dept  03/04/24 Procedure visit Edward Jolly, MD [facility]  Showing today's visits and meeting all other requirements Future Appointments Date Type Provider Dept  04/07/24 Appointment Edward Jolly, MD [facility]  Showing future appointments within next 90 days and meeting all other requirements  Disposition: Discharge home  Discharge (Date  Time): 03/04/2024; 0920 hrs.   Primary Care Physician: Enid Baas, MD Location: [facility] Note by: Edward Jolly, MD Date: 03/04/2024; Time: 10:35 AM  Disclaimer:  Medicine is not an exact science. The only guarantee in medicine is that nothing is guaranteed. It is important to note that the decision to proceed with this intervention was based on the information collected from the patient. The Data and conclusions were drawn from the patient's questionnaire, the interview, and the physical examination. Because the information was provided in large part by the patient, it cannot be guaranteed that it has not been purposely or unconsciously manipulated. Every effort has been made to obtain as much relevant data as possible for this evaluation. It is important to note that the conclusions that lead to this procedure are derived in large part from the available data. Always take into account that the treatment will also be dependent on availability of resources and existing treatment guidelines, considered by other Pain Management Practitioners as being common knowledge and practice, at the time of the intervention. For Medico-Legal purposes, it is also important to point out that variation in procedural  techniques and pharmacological choices are the acceptable norm. The indications, contraindications, technique, and results of the above procedure should only be interpreted and judged by a Board-Certified Interventional Pain Specialist with extensive familiarity and expertise in the same exact procedure and technique.

## 2024-03-04 NOTE — Progress Notes (Signed)
 Safety precautions to be maintained throughout the outpatient stay will include: orient to surroundings, keep bed in low position, maintain call bell within reach at all times, provide assistance with transfer out of bed and ambulation.

## 2024-03-04 NOTE — Patient Instructions (Signed)

## 2024-03-05 ENCOUNTER — Telehealth: Payer: Self-pay

## 2024-03-05 NOTE — Telephone Encounter (Signed)
 Post procedure follow up.  Patient states she is doing good.

## 2024-04-07 ENCOUNTER — Encounter: Payer: Self-pay | Admitting: Student in an Organized Health Care Education/Training Program

## 2024-04-07 ENCOUNTER — Ambulatory Visit
Payer: Medicare Other | Attending: Student in an Organized Health Care Education/Training Program | Admitting: Student in an Organized Health Care Education/Training Program

## 2024-04-07 VITALS — BP 128/80 | HR 63 | Temp 97.7°F | Resp 16 | Ht 67.0 in | Wt 230.0 lb

## 2024-04-07 DIAGNOSIS — M461 Sacroiliitis, not elsewhere classified: Secondary | ICD-10-CM | POA: Insufficient documentation

## 2024-04-07 DIAGNOSIS — G5701 Lesion of sciatic nerve, right lower limb: Secondary | ICD-10-CM | POA: Diagnosis present

## 2024-04-07 DIAGNOSIS — M47816 Spondylosis without myelopathy or radiculopathy, lumbar region: Secondary | ICD-10-CM | POA: Diagnosis present

## 2024-04-07 DIAGNOSIS — G894 Chronic pain syndrome: Secondary | ICD-10-CM | POA: Insufficient documentation

## 2024-04-07 DIAGNOSIS — M533 Sacrococcygeal disorders, not elsewhere classified: Secondary | ICD-10-CM | POA: Diagnosis not present

## 2024-04-07 MED ORDER — HYDROCODONE-ACETAMINOPHEN 7.5-325 MG PO TABS: 1.0000 | ORAL_TABLET | Freq: Three times a day (TID) | ORAL | 0 refills | Status: AC | PRN

## 2024-04-07 MED ORDER — HYDROCODONE-ACETAMINOPHEN 7.5-325 MG PO TABS: 1.0000 | ORAL_TABLET | Freq: Three times a day (TID) | ORAL | 0 refills | Status: DC | PRN

## 2024-04-07 MED ORDER — PREGABALIN 100 MG PO CAPS: 100.0000 mg | ORAL_CAPSULE | Freq: Three times a day (TID) | ORAL | 5 refills | Status: DC

## 2024-04-07 MED ORDER — LIDOCAINE 5 % EX PTCH: 1.0000 | MEDICATED_PATCH | Freq: Two times a day (BID) | CUTANEOUS | 11 refills | Status: AC

## 2024-04-07 NOTE — Progress Notes (Signed)
 Nursing Pain Medication Assessment:  Safety precautions to be maintained throughout the outpatient stay will include: orient to surroundings, keep bed in low position, maintain call bell within reach at all times, provide assistance with transfer out of bed and ambulation.  Medication Inspection Compliance: Pill count conducted under aseptic conditions, in front of the patient. Neither the pills nor the bottle was removed from the patient's sight at any time. Once count was completed pills were immediately returned to the patient in their original bottle.  Medication: Hydrocodone/APAP Pill/Patch Count:  8 of 90 pills remain Pill/Patch Appearance: Markings consistent with prescribed medication Bottle Appearance: Standard pharmacy container. Clearly labeled. Filled Date: 03 / 11 / 2025 Last Medication intake:  Today

## 2024-04-07 NOTE — Patient Instructions (Signed)
 ______________________________________________________________________    General Risks and Possible Complications  Patient Responsibilities: It is important that you read this as it is part of your informed consent. It is our duty to inform you of the risks and possible complications associated with treatments offered to you. It is your responsibility as a patient to read this and to ask questions about anything that is not clear or that you believe was not covered in this document.  Patient's Rights: You have the right to refuse treatment. You also have the right to change your mind, even after initially having agreed to have the treatment done. However, under this last option, if you wait until the last second to change your mind, you may be charged for the materials used up to that point.  Introduction: Medicine is not an Visual merchandiser. Everything in Medicine, including the lack of treatment(s), carries the potential for danger, harm, or loss (which is by definition: Risk). In Medicine, a complication is a secondary problem, condition, or disease that can aggravate an already existing one. All treatments carry the risk of possible complications. The fact that a side effects or complications occurs, does not imply that the treatment was conducted incorrectly. It must be clearly understood that these can happen even when everything is done following the highest safety standards.  No treatment: You can choose not to proceed with the proposed treatment alternative. The "PRO(s)" would include: avoiding the risk of complications associated with the therapy. The "CON(s)" would include: not getting any of the treatment benefits. These benefits fall under one of three categories: diagnostic; therapeutic; and/or palliative. Diagnostic benefits include: getting information which can ultimately lead to improvement of the disease or symptom(s). Therapeutic benefits are those associated with the successful  treatment of the disease. Finally, palliative benefits are those related to the decrease of the primary symptoms, without necessarily curing the condition (example: decreasing the pain from a flare-up of a chronic condition, such as incurable terminal cancer).  General Risks and Complications: These are associated to most interventional treatments. They can occur alone, or in combination. They fall under one of the following six (6) categories: no benefit or worsening of symptoms; bleeding; infection; nerve damage; allergic reactions; and/or death. No benefits or worsening of symptoms: In Medicine there are no guarantees, only probabilities. No healthcare provider can ever guarantee that a medical treatment will work, they can only state the probability that it may. Furthermore, there is always the possibility that the condition may worsen, either directly, or indirectly, as a consequence of the treatment. Bleeding: This is more common if the patient is taking a blood thinner, either prescription or over the counter (example: Goody Powders, Fish oil, Aspirin, Garlic, etc.), or if suffering a condition associated with impaired coagulation (example: Hemophilia, cirrhosis of the liver, low platelet counts, etc.). However, even if you do not have one on these, it can still happen. If you have any of these conditions, or take one of these drugs, make sure to notify your treating physician. Infection: This is more common in patients with a compromised immune system, either due to disease (example: diabetes, cancer, human immunodeficiency virus [HIV], etc.), or due to medications or treatments (example: therapies used to treat cancer and rheumatological diseases). However, even if you do not have one on these, it can still happen. If you have any of these conditions, or take one of these drugs, make sure to notify your treating physician. Nerve Damage: This is more common when the treatment is  an invasive one, but it  can also happen with the use of medications, such as those used in the treatment of cancer. The damage can occur to small secondary nerves, or to large primary ones, such as those in the spinal cord and brain. This damage may be temporary or permanent and it may lead to impairments that can range from temporary numbness to permanent paralysis and/or brain death. Allergic Reactions: Any time a substance or material comes in contact with our body, there is the possibility of an allergic reaction. These can range from a mild skin rash (contact dermatitis) to a severe systemic reaction (anaphylactic reaction), which can result in death. Death: In general, any medical intervention can result in death, most of the time due to an unforeseen complication. ______________________________________________________________________      ______________________________________________________________________    Preparing for your procedure  Appointments: If you think you may not be able to keep your appointment, call 24-48 hours in advance to cancel. We need time to make it available to others.  Procedure visits are for procedures only. During your procedure appointment there will be: NO Prescription Refills*. NO medication changes or discussions*. NO discussion of disability issues*. NO unrelated pain problem evaluations*. NO evaluations to order other pain procedures*. *These will be addressed at a separate and distinct evaluation encounter on the provider's evaluation schedule and not during procedure days.  Instructions: Food intake: Avoid eating anything solid for at least 8 hours prior to your procedure. Clear liquid intake: You may take clear liquids such as water up to 2 hours prior to your procedure. (No carbonated drinks. No soda.) Transportation: Unless otherwise stated by your physician, bring a driver. (Driver cannot be a Market researcher, Pharmacist, community, or any other form of public transportation.) Morning  Medicines: Except for blood thinners, take all of your other morning medications with a sip of water. Make sure to take your heart and blood pressure medicines. If your blood pressure's lower number is above 100, the case will be rescheduled. Blood thinners: Make sure to stop your blood thinners as instructed.  If you take a blood thinner, but were not instructed to stop it, call our office [phone] and ask to talk to a nurse. Not stopping a blood thinner prior to certain procedures could lead to serious complications. Diabetics on insulin: Notify the staff so that you can be scheduled 1st case in the morning. If your diabetes requires high dose insulin, take only  of your normal insulin dose the morning of the procedure and notify the staff that you have done so. Preventing infections: Shower with an antibacterial soap the morning of your procedure.  Build-up your immune system: Take 1000 mg of Vitamin C with every meal (3 times a day) the day prior to your procedure. Antibiotics: Inform the nursing staff if you are taking any antibiotics or if you have any conditions that may require antibiotics prior to procedures. (Example: recent joint implants)   Pregnancy: If you are pregnant make sure to notify the nursing staff. Not doing so may result in injury to the fetus, including death.  Sickness: If you have a cold, fever, or any active infections, call and cancel or reschedule your procedure. Receiving steroids while having an infection may result in complications. Arrival: You must be in the facility at least 30 minutes prior to your scheduled procedure. Tardiness: Your scheduled time is also the cutoff time. If you do not arrive at least 15 minutes prior to your procedure, you will  be rescheduled.  Children: Do not bring any children with you. Make arrangements to keep them home. Dress appropriately: There is always a possibility that your clothing may get soiled. Avoid long dresses. Valuables:  Do not bring any jewelry or valuables.  Reasons to call and reschedule or cancel your procedure: (Following these recommendations will minimize the risk of a serious complication.) Surgeries: Avoid having procedures within 2 weeks of any surgery. (Avoid for 2 weeks before or after any surgery). Flu Shots: Avoid having procedures within 2 weeks of a flu shots or . (Avoid for 2 weeks before or after immunizations). Barium: Avoid having a procedure within 7-10 days after having had a radiological study involving the use of radiological contrast. (Myelograms, Barium swallow or enema study). Heart attacks: Avoid any elective procedures or surgeries for the initial 6 months after a "Myocardial Infarction" (Heart Attack). Blood thinners: It is imperative that you stop these medications before procedures. Let us know if you if you take any blood thinner.  Infection: Avoid procedures during or within two weeks of an infection (including chest colds or gastrointestinal problems). Symptoms associated with infections include: Localized redness, fever, chills, night sweats or profuse sweating, burning sensation when voiding, cough, congestion, stuffiness, runny nose, sore throat, diarrhea, nausea, vomiting, cold or Flu symptoms, recent or current infections. It is specially important if the infection is over the area that we intend to treat. Heart and lung problems: Symptoms that may suggest an active cardiopulmonary problem include: cough, chest pain, breathing difficulties or shortness of breath, dizziness, ankle swelling, uncontrolled high or unusually low blood pressure, and/or palpitations. If you are experiencing any of these symptoms, cancel your procedure and contact your primary care physician for an evaluation.  Remember:  Regular Business hours are:  Monday to Thursday 8:00 AM to 4:00 PM  Provider's Schedule: Delano Metz, MD:  Procedure days: Tuesday and Thursday 7:30 AM to 4:00 PM  Edward Jolly, MD:  Procedure days: Monday and Wednesday 7:30 AM to 4:00 PM Last  Updated: 12/10/2023 ______________________________________________________________________     Sacroiliac (SI) Joint Injection Patient Information  Description: The sacroiliac joint connects the scrum (very low back and tailbone) to the ilium (a pelvic bone which also forms half of the hip joint).  Normally this joint experiences very little motion.  When this joint becomes inflamed or unstable low back and or hip and pelvis pain may result.  Injection of this joint with local anesthetics (numbing medicines) and steroids can provide diagnostic information and reduce pain.  This injection is performed with the aid of x-ray guidance into the tailbone area while you are lying on your stomach.   You may experience an electrical sensation down the leg while this is being done.  You may also experience numbness.  We also may ask if we are reproducing your normal pain during the injection.  Conditions which may be treated SI injection:  Low back, buttock, hip or leg pain  Preparation for the Injection:  Do not eat any solid food or dairy products within 8 hours of your appointment.  You may drink clear liquids up to 3 hours before appointment.  Clear liquids include water, black coffee, juice or soda.  No milk or cream please. You may take your regular medications, including pain medications with a sip of water before your appointment.  Diabetics should hold regular insulin (if take separately) and take 1/2 normal NPH dose the morning of the procedure.  Carry some sugar containing items with  you to your appointment. A driver must accompany you and be prepared to drive you home after your procedure. Bring all of your current medications with you. An IV may be inserted and sedation may be given at the discretion of the physician. A blood pressure cuff, EKG and other monitors will often be applied during the procedure.  Some  patients may need to have extra oxygen administered for a short period.  You will be asked to provide medical information, including your allergies, prior to the procedure.  We must know immediately if you are taking blood thinners (like Coumadin/Warfarin) or if you are allergic to IV iodine contrast (dye).  We must know if you could possible be pregnant.  Possible side effects:  Bleeding from needle site Infection (rare, may require surgery) Nerve injury (rare) Numbness & tingling (temporary) A brief convulsion or seizure Light-headedness (temporary) Pain at injection site (several days) Decreased blood pressure (temporary) Weakness in the leg (temporary)   Call if you experience:  New onset weakness or numbness of an extremity below the injection site that last more than 8 hours. Hives or difficulty breathing ( go to the emergency room) Inflammation or drainage at the injection site Any new symptoms which are concerning to you  Please note:  Although the local anesthetic injected can often make your back/ hip/ buttock/ leg feel good for several hours after the injections, the pain will likely return.  It takes 3-7 days for steroids to work in the sacroiliac area.  You may not notice any pain relief for at least that one week.  If effective, we will often do a series of three injections spaced 3-6 weeks apart to maximally decrease your pain.  After the initial series, we generally will wait some months before a repeat injection of the same type.  If you have any questions, please call [phone] [facility]

## 2024-04-07 NOTE — Progress Notes (Signed)
 PROVIDER NOTE: Interpretation of information contained herein should be left to medically-trained personnel. Specific patient instructions are provided elsewhere under "Patient Instructions" section of medical record. This document was created in part using AI and STT-dictation technology, any transcriptional errors that may result from this process are unintentional.  Patient: Sandy Byrd  Service: E/M   PCP: Enid Baas, MD  DOB: Nov 29, 1955  DOS: 04/07/2024  Provider: Edward Jolly, MD  MRN: [id number]  Delivery: Face-to-face  Specialty: Interventional Pain Management  Type: Established Patient  Setting: Ambulatory outpatient facility  Specialty designation: 09  Referring Prov.: Enid Baas, MD  Location: Outpatient office facility       HPI  Sandy Byrd, a 69 y.o. year old female, is here today because of her Lumbar spondylosis [M47.816]. Sandy Byrd's primary complain today is Back Pain  Pertinent problems: Sandy Byrd has Lumbar facet joint syndrome; Lumbar degenerative disc disease; Chronic pain syndrome; and H/O rotator cuff surgery (bilateral) on their pertinent problem list. Pain Assessment: Severity of Chronic pain is reported as a 7 /10. Location: Back Lower/into hips; down back of right leg to knee; NEW  pain - sharp pain in middle of right buttock, burning and aching. Onset: More than a month ago. Quality: Aching, Burning, Sharp. Timing: Constant. Modifying factor(s): meds. Vitals:  height is 5\' 7"  (1.702 m) and weight is 230 lb (104.3 kg). Her temperature is 97.7 F (36.5 C). Her blood pressure is 128/80 and her pulse is 63. Her respiration is 16 and oxygen saturation is 100%.  BMI: Estimated body mass index is 36.02 kg/m as calculated from the following:   Height as of this encounter: 5\' 7"  (1.702 m).   Weight as of this encounter: 230 lb (104.3 kg). Last encounter: 10/01/2023. Last procedure: 03/04/2024.  Reason for encounter: evaluation for possible  interventional PM therapy/treatment and Medication management. Also to review PPE. The patient indicates doing well with current medication regimen. No adverse reaction or side effects reported to the medication. She notes improved pain relief with radiofrequency (RFA) (100/100/60%)- details below. However the patient now reports new sharp and aching on the left side of the buttock (L>R) which she believes radiates from her lumbar region. No trauma, falls, injuries.   Post-procedure evaluation    Type: Lumbar Facet, Medial Branch Radiofrequency Ablation (RFA) #3  Laterality: Right  Level: L3, L4, L5, Medial Branch Level(s). These levels will denervate the L3-4 and L5-S1 lumbar facet joints.  Imaging: Fluoroscopic guidance Anesthesia: Local anesthesia (1-2% Lidocaine) Anxiolysis: none DOS: 03/04/2024  Performed by: Edward Jolly, MD  Purpose: Therapeutic/Palliative Indications: Low back pain severe enough to impact quality of life or function. Indications: 1. Lumbar spondylosis   2. Lumbar facet joint syndrome    Sandy Byrd has been dealing with the above chronic pain for longer than three months and has either failed to respond, was unable to tolerate, or simply did not get enough benefit from other more conservative therapies including, but not limited to: 1. Over-the-counter medications 2. Anti-inflammatory medications 3. Muscle relaxants 4. Membrane stabilizers 5. Opioids 6. Physical therapy and/or chiropractic manipulation 7. Modalities (Heat, ice, etc.) 8. Invasive techniques such as nerve blocks. Sandy Byrd has attained more than 50% relief of the pain from a series of diagnostic injections conducted in separate occasions.  Pain Score: Pre-procedure: 7 /10 Post-procedure: 7 /10   Effectiveness:  Initial hour after procedure: 100 %  Subsequent 4-6 hours post-procedure: 100 %  Analgesia past initial 6 hours: 60 %  Ongoing improvement:  Analgesic:  60% Function:  Somewhat improved ROM: Somewhat improved   Pharmacotherapy Assessment  Analgesic: Hydrocodone-acetaminophen (Norco) 7.5-325 mg tab every 8 hours as needed for pain.  MME = 22 Monitoring: [place] PMP: PDMP reviewed during this encounter.       Pharmacotherapy: No side-effects or adverse reactions reported. Compliance: No problems identified. Effectiveness: Clinically acceptable.  Nonah Mattes, RN  04/07/2024 10:37 AM  Sign when Signing Visit Nursing Pain Medication Assessment:  Safety precautions to be maintained throughout the outpatient stay will include: orient to surroundings, keep bed in low position, maintain call bell within reach at all times, provide assistance with transfer out of bed and ambulation.  Medication Inspection Compliance: Pill count conducted under aseptic conditions, in front of the patient. Neither the pills nor the bottle was removed from the patient's sight at any time. Once count was completed pills were immediately returned to the patient in their original bottle.  Medication: Hydrocodone/APAP Pill/Patch Count:  8 of 90 pills remain Pill/Patch Appearance: Markings consistent with prescribed medication Bottle Appearance: Standard pharmacy container. Clearly labeled. Filled Date: 03 / 11 / 2025 Last Medication intake:  Today    No results found for: "CBDTHCR" No results found for: "D8THCCBX" No results found for: "D9THCCBX"  UDS:  Summary  Date Value Ref Range Status  10/01/2023 Note  Final    Comment:    ==================================================================== [employer] Select 13 (MW) ==================================================================== Test                             Result       Flag       Units  Drug Present and Declared for Prescription Verification   Hydrocodone                    366          EXPECTED   ng/mg creat   Hydromorphone                  82           EXPECTED   ng/mg creat   Dihydrocodeine                 98            EXPECTED   ng/mg creat   Norhydrocodone                 624          EXPECTED   ng/mg creat    Sources of hydrocodone include scheduled prescription medications.    Hydromorphone, dihydrocodeine and norhydrocodone are expected    metabolites of hydrocodone. Hydromorphone and dihydrocodeine are    also available as scheduled prescription medications.  ==================================================================== Test                      Result    Flag   Units      Ref Range   Creatinine              85               mg/dL      >=16 ==================================================================== Declared Medications:  The flagging and interpretation on this report are based on the  following declared medications.  Unexpected results may arise from  inaccuracies in the declared medications.   **Note: The testing scope of this panel includes these medications:  Hydrocodone (Norco)   **Note: The testing scope of this panel does not include the  following reported medications:   Acetaminophen  Acetaminophen (Norco)  Albuterol  Baclofen (Lioresal)  Benzonatate (Tessalon)  Celecoxib (Celebrex)  Cyclosporine (Restasis)  Epinephrine (EpiPen)  Escitalopram (Lexapro)  Fluticasone (Flonase)  Folic Acid  Furosemide (Lasix)  Hydrochlorothiazide (Dyazide)  Ketoconazole (Nizoral)  Levothyroxine (Synthroid)  Magnesium  Metronidazole (MetroGel)  Potassium  Pregabalin (Lyrica)  Simvastatin (Zocor)  Topical Lidocaine (Lidoderm)  Triamcinolone (Kenalog)  Triamterene (Dyazide)  Turmeric  Vitamin C  Vitamin D  Vitamin D2 (Drisdol)  Zolpidem (Ambien) ==================================================================== For clinical consultation, please call [phone]. ====================================================================       ROS  Constitutional: Denies any fever or chills Gastrointestinal: No reported hemesis, hematochezia, vomiting, or  acute GI distress Musculoskeletal: Left buttock pain (L>R) radiate from Lumbar region  Neurological: No reported episodes of acute onset apraxia, aphasia, dysarthria, agnosia, amnesia, paralysis, loss of coordination, or loss of consciousness  Medication Review  ALPRAZolam, EPINEPHrine, HYDROcodone-acetaminophen, Magnesium, Turmeric, Vitamin D (Ergocalciferol), acetaminophen, albuterol, ascorbic acid, baclofen, benzonatate, celecoxib, cyanocobalamin, cycloSPORINE, ergocalciferol, escitalopram, fluticasone, folic acid, furosemide, ketoconazole, levothyroxine, lidocaine, metroNIDAZOLE, pilocarpine, potassium chloride SA, pregabalin, simvastatin, triamcinolone cream, triamterene-hydrochlorothiazide, and zolpidem  History Review  Allergy: Sandy Byrd is allergic to cephalexin, cephalosporins, prochlorperazine, shrimp extract, bee venom, latex, other, and silicone. Drug: Sandy Byrd  reports no history of drug use. Alcohol:  reports current alcohol use. Tobacco:  reports that she quit smoking about 22 years ago. Her smoking use included cigarettes. She has never been exposed to tobacco smoke. She has never used smokeless tobacco. Social: Sandy Byrd  reports that she quit smoking about 22 years ago. Her smoking use included cigarettes. She has never been exposed to tobacco smoke. She has never used smokeless tobacco. She reports current alcohol use. She reports that she does not use drugs. Medical:  has a past medical history of Anxiety, Arthritis, Depression, Hypertension, Rosacea, and Thyroid disease. Surgical: Sandy Byrd  has a past surgical history that includes Shoulder surgery (Right, 2016) and Shoulder surgery (Right, 2017). Family: She was adopted. Family history is unknown by patient.  Laboratory Chemistry Profile   Renal No results found for: "BUN", "CREATININE", "LABCREA", "BCR", "GFR", "GFRAA", "GFRNONAA", "LABVMA", "EPIRU", "EPINEPH24HUR", "NOREPRU", "NOREPI24HUR", "DOPARU",  "DOPAM24HRUR"  Hepatic No results found for: "AST", "ALT", "ALBUMIN", "ALKPHOS", "HCVAB", "AMYLASE", "LIPASE", "AMMONIA"  Electrolytes No results found for: "NA", "K", "CL", "CALCIUM", "MG", "PHOS"  Bone No results found for: "VD25OH", "VD125OH2TOT", "UJ8119JY7", "[id number]", "25OHVITD1", "25OHVITD2", "25OHVITD3", "TESTOFREE", "TESTOSTERONE"  Inflammation (CRP: Acute Phase) (ESR: Chronic Phase) No results found for: "CRP", "ESRSEDRATE", "LATICACIDVEN"       Note: Above Lab results reviewed.   Physical Exam  General appearance: alert, cooperative, and Well nourished, well developed, and well hydrated. In no apparent acute distress Mental status: Alert, oriented x 3 (person, place, & time)       Respiratory: No evidence of acute respiratory distress Eyes: PERLA Vitals: BP 128/80   Pulse 63   Temp 97.7 F (36.5 C)   Resp 16   Ht 5\' 7"  (1.702 m)   Wt 230 lb (104.3 kg)   SpO2 100%   BMI 36.02 kg/m  BMI: Estimated body mass index is 36.02 kg/m as calculated from the following:   Height as of this encounter: 5\' 7"  (1.702 m).   Weight as of this encounter: 230 lb (104.3 kg). Ideal: Ideal body weight: 61.6 kg (135 lb 12.9 oz) Adjusted ideal body weight: 78.7 kg (173 lb  7.7 oz)  Lumbar Spine Area Exam  Skin & Axial Inspection: No masses, redness, or swelling Alignment: Symmetrical Functional ROM: Pain restricted ROM affecting primarily the right Stability: No instability detected Muscle Tone/Strength: Functionally intact. No obvious neuro-muscular anomalies detected. Sensory (Neurological): Referred pain pattern from right SIJ Palpation: No palpable anomalies       Provocative Tests:  Patrick's Maneuver: (+) for right-sided S-I arthralgia FABER* test: (+) for right-sided S-I arthralgia S-I anterior distraction/compression test: (+) for right-sided S-I arthralgia S-I lateral compression test: (+) for right-sided S-I arthralgia S-I Thigh-thrust test: (+) for right-sided S-I  arthralgia S-I Gaenslen's test:(+) for right-sided S-I arthralgia   Assessment   Diagnosis Status  1. Lumbar spondylosis   2. Lumbar facet joint syndrome   3. Chronic pain syndrome   4. SI joint arthritis (HCC)   5. Sacroiliac joint pain   6. Piriformis syndrome of right side    Controlled Improving Controlled   Plan of Care  Problem-specific:  Assessment and Plan The patient reports doing well with her current medication regimen without any adverse reaction or side effects.  She notes 60% improvement in pain relief on her right lumbar side following radiofrequency ablation (RFA). She report new pain on left side of the buttock (L>R) that is localized to her SIJ. The Patient interest in receiving a left lumbar sacroiliac (SI) joint injection for additional pain relief.  Sandy Byrd has a current medication list which includes the following long-term medication(s): escitalopram, fluticasone, furosemide, levothyroxine, potassium chloride sa, pregabalin, simvastatin, triamterene-hydrochlorothiazide, ventolin hfa, and zolpidem.  Pharmacotherapy (Medications Ordered): Meds ordered this encounter  Medications   HYDROcodone-acetaminophen (NORCO) 7.5-325 MG tablet    Sig: Take 1 tablet by mouth every 8 (eight) hours as needed.    Dispense:  90 tablet    Refill:  0   HYDROcodone-acetaminophen (NORCO) 7.5-325 MG tablet    Sig: Take 1 tablet by mouth every 8 (eight) hours as needed.    Dispense:  90 tablet    Refill:  0   HYDROcodone-acetaminophen (NORCO) 7.5-325 MG tablet    Sig: Take 1 tablet by mouth every 8 (eight) hours as needed.    Dispense:  90 tablet    Refill:  0   pregabalin (LYRICA) 100 MG capsule    Sig: Take 1 capsule (100 mg total) by mouth 3 (three) times daily.    Dispense:  90 capsule    Refill:  5    Fill one day early if pharmacy is closed on scheduled refill date. May substitute for generic if available.   lidocaine (LIDODERM) 5 %    Sig: Place 1 patch  onto the skin every 12 (twelve) hours. Remove & Discard patch within 12 hours or as directed by MD    Dispense:  60 patch    Refill:  11   Orders:  Orders Placed This Encounter  Procedures   SACROILIAC JOINT INJECTION    Physical Examination Findings: Positive Sacral Thrust (Sacral Spring, Downward Pressure): (Y) Positive FABER maneuver (Patrick's): (Y) Positive SI distraction (Gapping): (Y) Positive SI compression (Approximation): (Y) Positive Thigh Thrust:  (Y) Positive Gaenslen's: (Y) Positive Sacral Sulcus Tenderness: (Y)    Standing Status:   Future    Expiration Date:   07/07/2024    Scheduling Instructions:     Procedure: Sacroiliac Joint Injection + Right Piriformis TPI     Side  Laterality: RIGHT     Sedation: Patient's choice.     Timeframe: As soon as  schedule allows.    Where will this procedure be performed?:   [facility]   Follow-up plan:   Return in about 3 months (around 07/07/2024) for Left SIJ and Left Piriformis TPI with Dr Cherylann Ratel, (F2F), (MM).     Recent Visits Date Type Provider Dept  03/04/24 Procedure visit Edward Jolly, MD [facility]  Showing recent visits within past 90 days and meeting all other requirements Today's Visits Date Type Provider Dept  04/07/24 Office Visit Edward Jolly, MD [facility]  Showing today's visits and meeting all other requirements Future Appointments Date Type Provider Dept  05/06/24 Appointment Edward Jolly, MD [facility]  06/30/24 Appointment Bettey Costa, NP [facility]  Showing future appointments within next 90 days and meeting all other requirements  I discussed the assessment and treatment plan with the patient. The patient was provided an opportunity to ask questions and all were answered. The patient agreed with the plan and demonstrated an understanding of the instructions.  Patient advised to call back or seek an in-person evaluation if the symptoms or  condition worsens.  Duration of encounter: 30 minutes.  Total time on encounter, as per AMA guidelines included both the face-to-face and non-face-to-face time personally spent by the physician and/or other qualified health care professional(s) on the day of the encounter (includes time in activities that require the physician or other qualified health care professional and does not include time in activities normally performed by clinical staff). Physician's time may include the following activities when performed: Preparing to see the patient (e.g., pre-charting review of records, searching for previously ordered imaging, lab work, and nerve conduction tests) Review of prior analgesic pharmacotherapies. Reviewing PMP Interpreting ordered tests (e.g., lab work, imaging, nerve conduction tests) Performing post-procedure evaluations, including interpretation of diagnostic procedures Obtaining and/or reviewing separately obtained history Performing a medically appropriate examination and/or evaluation Counseling and educating the patient/family/caregiver Ordering medications, tests, or procedures Referring and communicating with other health care professionals (when not separately reported) Documenting clinical information in the electronic or other health record Independently interpreting results (not separately reported) and communicating results to the patient/ family/caregiver Care coordination (not separately reported)  Note by: Edward Jolly, MD (TTS and AI technology used. I apologize for any typographical errors that were not detected and corrected.) Date: 04/07/2024; Time: 2:28 PM

## 2024-05-06 ENCOUNTER — Ambulatory Visit: Admitting: Student in an Organized Health Care Education/Training Program

## 2024-05-20 ENCOUNTER — Ambulatory Visit
Attending: Student in an Organized Health Care Education/Training Program | Admitting: Student in an Organized Health Care Education/Training Program

## 2024-05-20 ENCOUNTER — Ambulatory Visit
Admission: RE | Admit: 2024-05-20 | Discharge: 2024-05-20 | Disposition: A | Discharge status: Home / Self Care | Source: Ambulatory Visit | Attending: Student in an Organized Health Care Education/Training Program | Admitting: Student in an Organized Health Care Education/Training Program

## 2024-05-20 VITALS — BP 138/93 | HR 64 | Temp 98.0°F | Resp 11 | Ht 67.0 in | Wt 212.0 lb

## 2024-05-20 DIAGNOSIS — M533 Sacrococcygeal disorders, not elsewhere classified: Secondary | ICD-10-CM | POA: Insufficient documentation

## 2024-05-20 DIAGNOSIS — M461 Sacroiliitis, not elsewhere classified: Secondary | ICD-10-CM | POA: Diagnosis present

## 2024-05-20 DIAGNOSIS — G5702 Lesion of sciatic nerve, left lower limb: Secondary | ICD-10-CM

## 2024-05-20 DIAGNOSIS — G5701 Lesion of sciatic nerve, right lower limb: Secondary | ICD-10-CM | POA: Diagnosis not present

## 2024-05-20 DIAGNOSIS — G894 Chronic pain syndrome: Secondary | ICD-10-CM | POA: Diagnosis present

## 2024-05-20 MED ORDER — IOHEXOL 180 MG/ML  SOLN
INTRAMUSCULAR | Status: AC
  Filled 2024-05-20: qty 20

## 2024-05-20 MED ORDER — ROPIVACAINE HCL 2 MG/ML IJ SOLN
9.0000 mL | Freq: Once | INTRAMUSCULAR | Status: AC
  Administered 2024-05-20: 6 mL via PERINEURAL

## 2024-05-20 MED ORDER — LIDOCAINE HCL 2 % IJ SOLN
20.0000 mL | Freq: Once | INTRAMUSCULAR | Status: AC
  Administered 2024-05-20: 200 mg

## 2024-05-20 MED ORDER — DEXAMETHASONE SODIUM PHOSPHATE 10 MG/ML IJ SOLN
INTRAMUSCULAR | Status: AC
  Filled 2024-05-20: qty 2

## 2024-05-20 MED ORDER — IOHEXOL 180 MG/ML  SOLN
10.0000 mL | Freq: Once | INTRAMUSCULAR | Status: AC
  Administered 2024-05-20: 10 mL via INTRA_ARTICULAR

## 2024-05-20 MED ORDER — METHYLPREDNISOLONE ACETATE 80 MG/ML IJ SUSP
INTRAMUSCULAR | Status: AC
Start: 2024-05-20 — End: ?
  Filled 2024-05-20: qty 1

## 2024-05-20 MED ORDER — ROPIVACAINE HCL 2 MG/ML IJ SOLN
INTRAMUSCULAR | Status: AC
  Filled 2024-05-20: qty 20

## 2024-05-20 MED ORDER — LIDOCAINE HCL (PF) 2 % IJ SOLN
INTRAMUSCULAR | Status: AC
  Filled 2024-05-20: qty 10

## 2024-05-20 MED ORDER — METHYLPREDNISOLONE ACETATE 80 MG/ML IJ SUSP
80.0000 mg | Freq: Once | INTRAMUSCULAR | Status: AC
  Administered 2024-05-20: 80 mg

## 2024-05-20 NOTE — Progress Notes (Signed)
 Safety precautions to be maintained throughout the outpatient stay will include: orient to surroundings, keep bed in low position, maintain call bell within reach at all times, provide assistance with transfer out of bed and ambulation.

## 2024-05-20 NOTE — Progress Notes (Signed)
 PROVIDER NOTE: Interpretation of information contained herein should be left to medically-trained personnel. Specific patient instructions are provided elsewhere under "Patient Instructions" section of medical record. This document was created in part using STT-dictation technology, any transcriptional errors that may result from this process are unintentional.  Patient: Sandy Byrd Type: Established DOB: 10-22-1955 MRN: [id number] PCP: Rex Castor, MD  Service: Procedure DOS: 05/20/2024 Setting: Ambulatory Location: Ambulatory outpatient facility Delivery: Face-to-face Provider: Cephus Collin, MD Specialty: Interventional Pain Management Specialty designation: 09 Location: Outpatient facility Ref. Prov.: Rex Castor, MD       Interventional Therapy   ProcedureSacroiliac Joint Steroid Injection #1 and LEFT piriformis TPI  #1 Laterality: Left     Level: PIIS (Posterior inferior iliac Spine)  Imaging: Fluoroscopic guidance Anesthesia: Local anesthesia (1-2% Lidocaine ) DOS: 05/20/2024  Performed by: Cephus Collin, MD  Purpose: Diagnostic/Therapeutic Indications: Sacroiliac joint pain in the lower back and hip area severe enough to impact quality of life or function. Rationale (medical necessity): procedure needed and proper for the diagnosis and/or treatment of Sandy Byrd's medical symptoms and needs. 1. SI joint arthritis (HCC)   2. Sacroiliac joint pain   3. Piriformis syndrome of right side   4. Chronic pain syndrome    NAS-11 Pain score:   Pre-procedure: 8 /10   Post-procedure: 8 /10     Target: Interarticular sacroiliac joint. Location: Medial to the postero-medial edge of iliac spine. Region: Lumbosacral-sacrococcygeal. Approach: Inferior postero-medial percutaneous approach. Type of procedure: Percutaneous joint injection.  Position / Prep / Materials:  Position: Prone  Prep solution: DuraPrep (Iodine Povacrylex [0.7% available iodine] and Isopropyl  Alcohol, 74% w/w) Prep Area: Entire posterior lumbosacral area  Materials:  Tray: Block Needle(s):  Type: Spinal  Gauge (G): 22  Length: 3.5-in Qty: 1  H&P (Pre-op Assessment):  Sandy Byrd is a 69 y.o. (year old), female patient, seen today for interventional treatment. She  has a past surgical history that includes Shoulder surgery (Right, 2016) and Shoulder surgery (Right, 2017). Sandy Byrd has a current medication list which includes the following prescription(s): acetaminophen , alprazolam, ascorbic acid, baclofen, benzonatate , celecoxib, cyanocobalamin, cyclosporine, epinephrine, ergocalciferol, escitalopram, fluticasone, folic acid, furosemide, hydrocodone -acetaminophen , [START ON 06/09/2024] hydrocodone -acetaminophen , ketoconazole, levothyroxine, lidocaine , magnesium, metronidazole, pilocarpine, potassium chloride sa, pregabalin , triamcinolone cream, triamterene-hydrochlorothiazide, turmeric, ventolin hfa, vitamin d (ergocalciferol), simvastatin, and zolpidem. Her primarily concern today is the Hip Pain (left)  Initial Vital Signs:  Pulse/HCG Rate: 64ECG Heart Rate: 64 Temp: 98 F (36.7 C) Resp: 16 BP: 133/86 SpO2: 97 %  BMI: Estimated body mass index is 33.2 kg/m as calculated from the following:   Height as of this encounter: 5\' 7"  (1.702 m).   Weight as of this encounter: 212 lb (96.2 kg).  Risk Assessment: Allergies: Reviewed. She is allergic to cephalexin, cephalosporins, prochlorperazine, shrimp extract, bee venom, latex, other, and silicone.  Allergy Precautions: None required Coagulopathies: Reviewed. None identified.  Blood-thinner therapy: None at this time Active Infection(s): Reviewed. None identified. Sandy Byrd is afebrile  Site Confirmation: Sandy Byrd was asked to confirm the procedure and laterality before marking the site Procedure checklist: Completed Consent: Before the procedure and under the influence of no sedative(s), amnesic(s), or  anxiolytics, the patient was informed of the treatment options, risks and possible complications. To fulfill our ethical and legal obligations, as recommended by the American Medical Association's Code of Ethics, I have informed the patient of my clinical impression; the nature and purpose of the treatment or procedure; the risks, benefits, and possible complications of the  intervention; the alternatives, including doing nothing; the risk(s) and benefit(s) of the alternative treatment(s) or procedure(s); and the risk(s) and benefit(s) of doing nothing. The patient was provided information about the general risks and possible complications associated with the procedure. These may include, but are not limited to: failure to achieve desired goals, infection, bleeding, organ or nerve damage, allergic reactions, paralysis, and death. In addition, the patient was informed of those risks and complications associated to the procedure, such as failure to decrease pain; infection; bleeding; organ or nerve damage with subsequent damage to sensory, motor, and/or autonomic systems, resulting in permanent pain, numbness, and/or weakness of one or several areas of the body; allergic reactions; (i.e.: anaphylactic reaction); and/or death. Furthermore, the patient was informed of those risks and complications associated with the medications. These include, but are not limited to: allergic reactions (i.e.: anaphylactic or anaphylactoid reaction(s)); adrenal axis suppression; blood sugar elevation that in diabetics may result in ketoacidosis or comma; water retention that in patients with history of congestive heart failure may result in shortness of breath, pulmonary edema, and decompensation with resultant heart failure; weight gain; swelling or edema; medication-induced neural toxicity; particulate matter embolism and blood vessel occlusion with resultant organ, and/or nervous system infarction; and/or aseptic necrosis of one or  more joints. Finally, the patient was informed that Medicine is not an exact science; therefore, there is also the possibility of unforeseen or unpredictable risks and/or possible complications that may result in a catastrophic outcome. The patient indicated having understood very clearly. We have given the patient no guarantees and we have made no promises. Enough time was given to the patient to ask questions, all of which were answered to the patient's satisfaction. Ms. Riggle has indicated that she wanted to continue with the procedure. Attestation: I, the ordering provider, attest that I have discussed with the patient the benefits, risks, side-effects, alternatives, likelihood of achieving goals, and potential problems during recovery for the procedure that I have provided informed consent. Date  Time: 05/20/2024  7:55 AM  Pre-Procedure Preparation:  Monitoring: As per clinic protocol. Respiration, ETCO2, SpO2, BP, heart rate and rhythm monitor placed and checked for adequate function Safety Precautions: Patient was assessed for positional comfort and pressure points before starting the procedure. Time-out: I initiated and conducted the "Time-out" before starting the procedure, as per protocol. The patient was asked to participate by confirming the accuracy of the "Time Out" information. Verification of the correct person, site, and procedure were performed and confirmed by me, the nursing staff, and the patient. "Time-out" conducted as per Joint Commission's Universal Protocol (UP.01.01.01). Time: 0831 Start Time: 0831 hrs.  Description/Narrative of Procedure:          Start Time: 0831 hrs.  Rationale (medical necessity): procedure needed and proper for the diagnosis and/or treatment of the patient's medical symptoms and needs. Procedural Technique Safety Precautions: Aspiration looking for blood return was conducted prior to all injections. At no point did we inject any substances, as a  needle was being advanced. No attempts were made at seeking any paresthesias. Safe injection practices and needle disposal techniques used. Medications properly checked for expiration dates. SDV (single dose vial) medications used. Description of the Procedure: Protocol guidelines were followed. The patient was assisted into a comfortable position. The target area was identified and the area prepped in the usual manner. Skin & deeper tissues infiltrated with local anesthetic. Appropriate amount of time allowed to pass for local anesthetics to take effect. The procedure needles were  then advanced to the target area. Proper needle placement secured. Negative aspiration confirmed. Solution injected in intermittent fashion, asking for systemic symptoms every 0.5cc of injectate. The needles were then removed and the area cleansed, making sure to leave some of the prepping solution back to take advantage of its long term bactericidal properties.  Technical description of procedure:  Fluoroscopy using a posterior anterior 45 degree angle from the midline aiming at the anterolateral aspect of the patient was used to find a direct path into the sacroiliac joint, the superior medial to posterior superior iliac spine.  The skin was marked where the desired target and the skin infiltrated with local anesthetics.  The procedure needle was then advanced until the joint was entered.  Once inside of the joint, we then proceeded to inject the desired solution.  10 cc solution made of 9 cc of 0.2% ropivacaine , 1 cc of methylprednisolone , 80 mg/cc.  5 cc injected into the left SI joint after contrast confirmation.  Afterwards a left piriformis trigger point injection was done 1 cm inferior, 1 cm deep, 1 cm lateral to the inferior fissure of the SI joint.  Contrast was injected to confirm piriformis muscle striation.  5 cc injected into the left piriformis muscle.  While injecting, patient did not complain of any pain radiating  down her leg.   Vitals:   05/20/24 0806 05/20/24 0825 05/20/24 0830  BP: 133/86 (!) 158/96 (!) 138/93  Pulse: 64    Resp: 16 14 11   Temp: 98 F (36.7 C)    SpO2: 97% 99% 99%  Weight: 212 lb (96.2 kg)    Height: 5\' 7"  (1.702 m)       End Time: 0835 hrs.  Imaging Guidance (Non-Spinal):          Type of Imaging Technique: Fluoroscopy Guidance (Non-Spinal) Indication(s): Assistance in needle guidance and placement for procedures requiring needle placement in or near specific anatomical locations not easily accessible without such assistance. Exposure Time: Please see nurses notes. Contrast: Before injecting any contrast, we confirmed that the patient did not have an allergy to iodine, shellfish, or radiological contrast. Once satisfactory needle placement was completed at the desired level, radiological contrast was injected. Contrast injected under live fluoroscopy. No contrast complications. See chart for type and volume of contrast used. Fluoroscopic Guidance: I was personally present during the use of fluoroscopy. "Tunnel Vision Technique" used to obtain the best possible view of the target area. Parallax error corrected before commencing the procedure. "Direction-depth-direction" technique used to introduce the needle under continuous pulsed fluoroscopy. Once target was reached, antero-posterior, oblique, and lateral fluoroscopic projection used confirm needle placement in all planes. Images permanently stored in EMR. Interpretation: I personally interpreted the imaging intraoperatively. Adequate needle placement confirmed in multiple planes. Appropriate spread of contrast into desired area was observed. No evidence of afferent or efferent intravascular uptake. Permanent images saved into the patient's record.  Post-operative Assessment:  Post-procedure Vital Signs:  Pulse/HCG Rate: 6461 Temp: 98 F (36.7 C) Resp: 11 BP: (!) 138/93 SpO2: 99 %  EBL: None  Complications: No  immediate post-treatment complications observed by team, or reported by patient.  Note: The patient tolerated the entire procedure well. A repeat set of vitals were taken after the procedure and the patient was kept under observation following institutional policy, for this type of procedure. Post-procedural neurological assessment was performed, showing return to baseline, prior to discharge. The patient was provided with post-procedure discharge instructions, including a section on how to  identify potential problems. Should any problems arise concerning this procedure, the patient was given instructions to immediately contact us , at any time, without hesitation. In any case, we plan to contact the patient by telephone for a follow-up status report regarding this interventional procedure.  Comments:  No additional relevant information.  Plan of Care (POC)  Orders:  Orders Placed This Encounter  Procedures   DG PAIN CL[facility] C-ARM 1-60 MIN NO REPORT    Intraoperative interpretation by procedural physician at [facility].    Standing Status:   Standing    Number of Occurrences:   1    Reason for exam::   Assistance in needle guidance and placement for procedures requiring needle placement in or near specific anatomical locations not easily accessible without such assistance.   Medications ordered for procedure: Meds ordered this encounter  Medications   iohexol  (OMNIPAQUE ) 180 MG/ML injection 10 mL    Must be Myelogram-compatible. If not available, you may substitute with a water-soluble, non-ionic, hypoallergenic, myelogram-compatible radiological contrast medium.   lidocaine  (XYLOCAINE ) 2 % (with pres) injection 400 mg   ropivacaine  (PF) 2 mg/mL (0.2%) (NAROPIN ) injection 9 mL   methylPREDNISolone  acetate (DEPO-MEDROL ) injection 80 mg   Medications administered: We administered iohexol , lidocaine , ropivacaine  (PF) 2 mg/mL (0.2%), and methylPREDNISolone  acetate.  See the medical  record for exact dosing, route, and time of administration.  Follow-up plan:   Return for Keep sch. appt.       Recent Visits Date Type Provider Dept  04/07/24 Office Visit Cephus Collin, MD [facility]  03/04/24 Procedure visit Cephus Collin, MD [facility]  Showing recent visits within past 90 days and meeting all other requirements Today's Visits Date Type Provider Dept  05/20/24 Procedure visit Cephus Collin, MD [facility]  Showing today's visits and meeting all other requirements Future Appointments Date Type Provider Dept  06/30/24 Appointment Patel, Seema K, NP [facility]  Showing future appointments within next 90 days and meeting all other requirements  Disposition: Discharge home  Discharge (Date  Time): 05/20/2024; 0840 hrs.   Primary Care Physician: Rex Castor, MD Location: [facility] Note by: Cephus Collin, MD (TTS technology used. I apologize for any typographical errors that were not detected and corrected.) Date: 05/20/2024; Time: 8:41 AM  Disclaimer:  Medicine is not an Visual merchandiser. The only guarantee in medicine is that nothing is guaranteed. It is important to note that the decision to proceed with this intervention was based on the information collected from the patient. The Data and conclusions were drawn from the patient's questionnaire, the interview, and the physical examination. Because the information was provided in large part by the patient, it cannot be guaranteed that it has not been purposely or unconsciously manipulated. Every effort has been made to obtain as much relevant data as possible for this evaluation. It is important to note that the conclusions that lead to this procedure are derived in large part from the available data. Always take into account that the treatment will also be dependent on availability of resources and existing treatment guidelines, considered by  other Pain Management Practitioners as being common knowledge and practice, at the time of the intervention. For Medico-Legal purposes, it is also important to point out that variation in procedural techniques and pharmacological choices are the acceptable norm. The indications, contraindications, technique, and results of the above procedure should only be interpreted and judged by a Board-Certified Interventional Pain Specialist with extensive  familiarity and expertise in the same exact procedure and technique.

## 2024-05-20 NOTE — Patient Instructions (Signed)

## 2024-05-21 ENCOUNTER — Telehealth: Payer: Self-pay

## 2024-05-21 NOTE — Telephone Encounter (Signed)
 Post procedure follow up.  Patient states she is doing well.   ?

## 2024-06-30 ENCOUNTER — Ambulatory Visit: Attending: Nurse Practitioner | Admitting: Nurse Practitioner

## 2024-06-30 ENCOUNTER — Encounter: Payer: Self-pay | Admitting: Nurse Practitioner

## 2024-06-30 VITALS — BP 144/80 | HR 60 | Temp 97.2°F | Resp 18 | Ht 67.0 in | Wt 220.0 lb

## 2024-06-30 DIAGNOSIS — Z9889 Other specified postprocedural states: Secondary | ICD-10-CM | POA: Diagnosis present

## 2024-06-30 DIAGNOSIS — M12812 Other specific arthropathies, not elsewhere classified, left shoulder: Secondary | ICD-10-CM | POA: Diagnosis present

## 2024-06-30 DIAGNOSIS — M25512 Pain in left shoulder: Secondary | ICD-10-CM | POA: Diagnosis present

## 2024-06-30 DIAGNOSIS — M25561 Pain in right knee: Secondary | ICD-10-CM | POA: Insufficient documentation

## 2024-06-30 DIAGNOSIS — G8929 Other chronic pain: Secondary | ICD-10-CM | POA: Diagnosis present

## 2024-06-30 DIAGNOSIS — M1711 Unilateral primary osteoarthritis, right knee: Secondary | ICD-10-CM | POA: Diagnosis present

## 2024-06-30 DIAGNOSIS — G894 Chronic pain syndrome: Secondary | ICD-10-CM | POA: Diagnosis not present

## 2024-06-30 DIAGNOSIS — M47816 Spondylosis without myelopathy or radiculopathy, lumbar region: Secondary | ICD-10-CM | POA: Insufficient documentation

## 2024-06-30 DIAGNOSIS — M12811 Other specific arthropathies, not elsewhere classified, right shoulder: Secondary | ICD-10-CM | POA: Diagnosis present

## 2024-06-30 DIAGNOSIS — M7918 Myalgia, other site: Secondary | ICD-10-CM | POA: Insufficient documentation

## 2024-06-30 DIAGNOSIS — M25511 Pain in right shoulder: Secondary | ICD-10-CM | POA: Insufficient documentation

## 2024-06-30 MED ORDER — HYDROCODONE-ACETAMINOPHEN 7.5-325 MG PO TABS: 1.0000 | ORAL_TABLET | Freq: Three times a day (TID) | ORAL | 0 refills | Status: DC | PRN

## 2024-06-30 MED ORDER — HYDROCODONE-ACETAMINOPHEN 7.5-325 MG PO TABS: 1.0000 | ORAL_TABLET | Freq: Three times a day (TID) | ORAL | 0 refills | Status: AC | PRN

## 2024-06-30 MED ORDER — PREGABALIN 100 MG PO CAPS: 100.0000 mg | ORAL_CAPSULE | Freq: Three times a day (TID) | ORAL | 5 refills | Status: DC

## 2024-06-30 NOTE — Progress Notes (Signed)
 PROVIDER NOTE: Interpretation of information contained herein should be left to medically-trained personnel. Specific patient instructions are provided elsewhere under Patient Instructions section of medical record. This document was created in part using AI and STT-dictation technology, any transcriptional errors that may result from this process are unintentional.  Patient: Sandy Byrd  Service: E/M   PCP: Sherial Bail, MD  DOB: 13-May-1955  DOS: 06/30/2024  Provider: Emmy MARLA Blanch, NP  MRN: [id number]  Delivery: Face-to-face  Specialty: Interventional Pain Management  Type: Established Patient  Setting: Ambulatory outpatient facility  Specialty designation: 09  Referring Prov.: Sherial Bail, MD  Location: Outpatient office facility       History of present illness (HPI) [name], a 69 y.o. year old female, is here today because of her Chronic pain syndrome [G89.4]. [name]'s primary complain today is Back Pain  Pertinent problems: [name] has Lumbar spondylosis; Lumbar degenerative disc disease; Chronic pain syndrome; and History of rotator cuff surgery (bilateral on their pertinent problem list  Pain Assessment: Severity of Chronic pain is reported as a 7 /10. Location: Back Right/Radiates to righ knee. Onset: More than a month ago. Quality: Aching, Burning, Restless, Sharp. Timing: Constant. Modifying factor(s): Rest, medication, heat, ice, stretching. Vitals:  height is 5' 7 (1.702 m) and weight is 220 lb (99.8 kg). Her temperature is 97.2 F (36.2 C) (abnormal). Her blood pressure is 144/80 (abnormal) and her pulse is 60. Her respiration is 18 and oxygen saturation is 99%.  BMI: Estimated body mass index is 34.46 kg/m as calculated from the following:   Height as of this encounter: 5' 7 (1.702 m).   Weight as of this encounter: 220 lb (99.8 kg).  Last encounter: Visit date not found. Last procedure: Visit date not found.  Reason for encounter:  both, medication management and post-procedure evaluation and assessment. No change in medical history since last visit.  Patient's pain is at baseline.  Patient continues multimodal pain regimen as prescribed.  States that it provides pain relief and improvement in functional status.   Ms. Hyneman underwent a diagnostic/therapeutic sacroiliac joint steroid injection and piriformis TPI on May 20, 2024.  She reports 90 % pain relief and improvement during local anesthetic phase with sustained 70 % pain relief and functional improvement since the procedure.   Procedure ProcedureSacroiliac Joint Steroid Injection #1 and LEFT piriformis TPI  #1 Laterality: Left     Level: PIIS (Posterior inferior iliac Spine)  Imaging: Fluoroscopic guidance Anesthesia: Local anesthesia (1-2% Lidocaine ) DOS: 05/20/2024  Performed by: Wallie Sherry, MD   Purpose: Diagnostic/Therapeutic Indications: Sacroiliac joint pain in the lower back and hip area severe enough to impact quality of life or function. Rationale (medical necessity): procedure needed and proper for the diagnosis and/or treatment of [name]'s medical symptoms and needs. 1. SI joint arthritis (HCC)   2. Sacroiliac joint pain   3. Piriformis syndrome of right side   4. Chronic pain syndrome     NAS-11 Pain score:        Pre-procedure: 8 /10        Post-procedure: 8 /10    Target: Interarticular sacroiliac joint. Location: Medial to the postero-medial edge of iliac spine. Region: Lumbosacral-sacrococcygeal. Approach: Inferior postero-medial percutaneous approach. Type of procedure: Percutaneous joint injection.  Effectiveness:  Initial hour after procedure: 90 % . Subsequent 4-6 hours post-procedure: 80 % . Analgesia past initial 6 hours: 70 % . Ongoing improvement:  Analgesic: [name] underwent a diagnostic/therapeutic sacroiliac joint steroid  injection and piriformis TPI on May 20, 2024.  She reports 90 % pain relief and  improvement during local anesthetic phase with sustained 70 % pain relief and functional improvement since the procedure.  Function: [name] reports improvement in function ROM: [name] reports improvement in ROM  Pharmacotherapy Assessment   Analgesic:  Hydrocodone -acetaminophen  (Norco) 7.5-325 mg tab every 8 hours as needed for pain.  MME = 22  Pregabalin  (Lyrica  100 mg capsule 3 times daily Monitoring: [place] PMP: PDMP reviewed during this encounter.       Pharmacotherapy: No side-effects or adverse reactions reported. Compliance: No problems identified. Effectiveness: Clinically acceptable.  Erlene Doyal Sandy Byrd, [place]  06/30/2024  1:32 PM  Sign when Signing Visit Nursing Pain Medication Assessment:  Safety precautions to be maintained throughout the outpatient stay will include: orient to surroundings, keep bed in low position, maintain call bell within reach at all times, provide assistance with transfer out of bed and ambulation.  Medication Inspection Compliance: Pill count conducted under aseptic conditions, in front of the patient. Neither the pills nor the bottle was removed from the patient's sight at any time. Once count was completed pills were immediately returned to the patient in their original bottle.  Medication: Hydrocodone /APAP Pill/Patch Count: 33 of 90 pills/patches remain Pill/Patch Appearance: Markings consistent with prescribed medication Bottle Appearance: Standard pharmacy container. Clearly labeled. Filled Date: 05 / 11 / 2025 Last Medication intake:  Today    UDS:  Summary  Date Value Ref Range Status  10/01/2023 Note  Final    Comment:    ==================================================================== [employer] (MW) ==================================================================== Test                             Result       Flag       Units  Drug Present and Declared for Prescription Verification   Hydrocodone                      366          EXPECTED   ng/mg creat   Hydromorphone                  82           EXPECTED   ng/mg creat   Dihydrocodeine                 98           EXPECTED   ng/mg creat   Norhydrocodone                 624          EXPECTED   ng/mg creat    Sources of hydrocodone  include scheduled prescription medications.    Hydromorphone, dihydrocodeine and norhydrocodone are expected    metabolites of hydrocodone . Hydromorphone and dihydrocodeine are    also available as scheduled prescription medications.  ==================================================================== Test                      Result    Flag   Units      Ref Range   Creatinine              85               mg/dL      >=79 ==================================================================== Declared Medications:  The flagging and interpretation on this report are based on  the  following declared medications.  Unexpected results may arise from  inaccuracies in the declared medications.   **Note: The testing scope of this panel includes these medications:   Hydrocodone  (Norco)   **Note: The testing scope of this panel does not include the  following reported medications:   Acetaminophen   Acetaminophen  (Norco)  Albuterol  Baclofen (Lioresal)  Benzonatate  (Tessalon )  Celecoxib (Celebrex)  Cyclosporine (Restasis)  Epinephrine (EpiPen)  Escitalopram (Lexapro)  Fluticasone (Flonase)  Folic Acid  Furosemide (Lasix)  Hydrochlorothiazide (Dyazide)  Ketoconazole (Nizoral)  Levothyroxine (Synthroid)  Magnesium  Metronidazole (MetroGel)  Potassium  Pregabalin  (Lyrica )  Simvastatin (Zocor)  Topical Lidocaine  (Lidoderm )  Triamcinolone (Kenalog)  Triamterene (Dyazide)  Turmeric  Vitamin C  Vitamin D  Vitamin D2 (Drisdol)  Zolpidem (Ambien) ==================================================================== For clinical consultation, please call [phone]. ====================================================================     No results found for: CBDTHCR No results found for: D8THCCBX No results found for: D9THCCBX  ROS  Constitutional: Denies any fever or chills Gastrointestinal: No reported hemesis, hematochezia, vomiting, or acute GI distress Musculoskeletal: Low back pain Neurological: No reported episodes of acute onset apraxia, aphasia, dysarthria, agnosia, amnesia, paralysis, loss of coordination, or loss of consciousness  Medication Review  ALPRAZolam, EPINEPHrine, HYDROcodone -acetaminophen , Magnesium, Turmeric, Vitamin D (Ergocalciferol), acetaminophen , albuterol, ascorbic acid, azelastine, baclofen, benzonatate , celecoxib, cyanocobalamin, cycloSPORINE, ergocalciferol, escitalopram, fluticasone, folic acid, furosemide, ketoconazole, levothyroxine, lidocaine , metroNIDAZOLE, pilocarpine, potassium chloride SA, pregabalin , simvastatin, triamcinolone cream, triamterene-hydrochlorothiazide, and zolpidem  History Review  Allergy: Ms. Lauro is allergic to cephalexin, cephalosporins, prochlorperazine, shrimp extract, bee venom, latex, other, and silicone. Drug: Ms. Quant  reports no history of drug use. Alcohol:  reports current alcohol use. Tobacco:  reports that she quit smoking about 22 years ago. Her smoking use included cigarettes. She has never been exposed to tobacco smoke. She has never used smokeless tobacco. Social: Ms. Crockett  reports that she quit smoking about 22 years ago. Her smoking use included cigarettes. She has never been exposed to tobacco smoke. She has never used smokeless tobacco. She reports current alcohol use. She reports that she does not use drugs. Medical:  has a past medical history of Anxiety, Arthritis, Depression, Hypertension, Rosacea, and Thyroid disease. Surgical: Ms. Pehrson  has a past surgical history that includes Shoulder surgery (Right, 2016) and Shoulder surgery  (Right, 2017). Family: She was adopted. Family history is unknown by patient.  Laboratory Chemistry Profile   Renal No results found for: BUN, CREATININE, LABCREA, BCR, GFR, GFRAA, GFRNONAA, LABVMA, EPIRU, EPINEPH24HUR, NOREPRU, NOREPI24HUR, DOPARU, [id number]  Hepatic No results found for: AST, ALT, ALBUMIN, ALKPHOS, HCVAB, AMYLASE, LIPASE, AMMONIA  Electrolytes No results found for: NA, K, CL, CALCIUM, MG, PHOS  Bone No results found for: VD25OH, [id number], CI6874NY7, CI7874NY7, 25OHVITD1, 25OHVITD2, 25OHVITD3, TESTOFREE, TESTOSTERONE  Inflammation (CRP: Acute Phase) (ESR: Chronic Phase) No results found for: CRP, ESRSEDRATE, LATICACIDVEN       Note: Above Lab results reviewed.  Recent Imaging Review  DG PAIN CLINIC C-ARM 1-60 MIN NO REPORT Fluoro was used, but no Radiologist interpretation will be provided.  Please refer to NOTES tab for provider progress note. Note: Reviewed        Physical Exam  Vitals: BP (!) 144/80   Pulse 60   Temp (!) 97.2 F (36.2 C)   Resp 18   Ht 5' 7 (1.702 m)   Wt 220 lb (99.8 kg)   SpO2 99%   BMI 34.46 kg/m  BMI: Estimated body mass index is 34.46 kg/m as calculated from the following:  Height as of this encounter: 5' 7 (1.702 m).   Weight as of this encounter: 220 lb (99.8 kg). Ideal: Ideal body weight: 61.6 kg (135 lb 12.9 oz) Adjusted ideal body weight: 76.9 kg (169 lb 7.7 oz) General appearance: Well nourished, well developed, and well hydrated. In no apparent acute distress Mental status: Alert, oriented x 3 (person, place, & time)       Respiratory: No evidence of acute respiratory distress Eyes: PERLA Assessment   Diagnosis Status  1. Chronic pain syndrome   2. H/O rotator cuff surgery (bilateral)   3. Lumbar facet joint syndrome   4. Chronic pain of right knee   5. Chronic pain of both shoulders (right greater than left)   6. Myofascial  pain syndrome, cervical   7. Rotator cuff arthropathy of both shoulders   8. Primary osteoarthritis of right knee    Controlled Controlled Controlled   Updated Problems: No problems updated.  Plan of Care  Problem-specific:  Assessment and Plan  We will continue on current medication regimen.  Prescribing drug monitoring (PDMP) reviewed; findings consistent with use of prescribed medication and no evidence of narcotic misuse or abuse.  Schedule follow-up in 90 days for medication management.  No new issues or problems reported to this visit.   Ms. TNYA ADES has a current medication list which includes the following long-term medication(s): azelastine, escitalopram, fluticasone, furosemide, levothyroxine, potassium chloride sa, simvastatin, triamterene-hydrochlorothiazide, ventolin hfa, zolpidem, and pregabalin .  Pharmacotherapy (Medications Ordered): Meds ordered this encounter  Medications   HYDROcodone -acetaminophen  (NORCO) 7.5-325 MG tablet    Sig: Take 1 tablet by mouth every 8 (eight) hours as needed.    Dispense:  90 tablet    Refill:  0   HYDROcodone -acetaminophen  (NORCO) 7.5-325 MG tablet    Sig: Take 1 tablet by mouth every 8 (eight) hours as needed.    Dispense:  90 tablet    Refill:  0   HYDROcodone -acetaminophen  (NORCO) 7.5-325 MG tablet    Sig: Take 1 tablet by mouth every 8 (eight) hours as needed.    Dispense:  90 tablet    Refill:  0   pregabalin  (LYRICA ) 100 MG capsule    Sig: Take 1 capsule (100 mg total) by mouth 3 (three) times daily.    Dispense:  90 capsule    Refill:  5    Fill one day early if pharmacy is closed on scheduled refill date. May substitute for generic if available.   Orders:  No orders of the defined types were placed in this encounter.     Return in about 3 months (around 09/30/2024) for (F2F), (MM), Emmy Blanch NP.    Recent Visits Date Type Provider Dept  05/20/24 Procedure visit Marcelino Nurse, MD [facility]   04/07/24 Office Visit Marcelino Nurse, MD [facility]  Showing recent visits within past 90 days and meeting all other requirements Today's Visits Date Type Provider Dept  06/30/24 Office Visit Jaeleigh Monaco K, NP [facility]  Showing today's visits and meeting all other requirements Future Appointments Date Type Provider Dept  09/21/24 Appointment Caoilainn Sacks K, NP [facility]  Showing future appointments within next 90 days and meeting all other requirements  I discussed the assessment and treatment plan with the patient. The patient was provided an opportunity to ask questions and all were answered. The patient agreed with the plan and demonstrated an understanding of the instructions.  Patient advised to call back or seek an  in-person evaluation if the symptoms or condition worsens.  Duration of encounter: 30 minutes.  Total time on encounter, as per AMA guidelines included both the face-to-face and non-face-to-face time personally spent by the physician and/or other qualified health care professional(s) on the day of the encounter (includes time in activities that require the physician or other qualified health care professional and does not include time in activities normally performed by clinical staff). Physician's time may include the following activities when performed: Preparing to see the patient (e.g., pre-charting review of records, searching for previously ordered imaging, lab work, and nerve conduction tests) Review of prior analgesic pharmacotherapies. Reviewing PMP Interpreting ordered tests (e.g., lab work, imaging, nerve conduction tests) Performing post-procedure evaluations, including interpretation of diagnostic procedures Obtaining and/or reviewing separately obtained history Performing a medically appropriate examination and/or evaluation Counseling and educating the patient/family/caregiver Ordering medications, tests, or  procedures Referring and communicating with other health care professionals (when not separately reported) Documenting clinical information in the electronic or other health record Independently interpreting results (not separately reported) and communicating results to the patient/ family/caregiver Care coordination (not separately reported)  Note by: Haruki Arnold K Mariluz Crespo, NP (TTS and AI technology used. I apologize for any typographical errors that were not detected and corrected.) Date: 06/30/2024; Time: 2:47 PM

## 2024-06-30 NOTE — Progress Notes (Signed)
 Nursing Pain Medication Assessment:  Safety precautions to be maintained throughout the outpatient stay will include: orient to surroundings, keep bed in low position, maintain call bell within reach at all times, provide assistance with transfer out of bed and ambulation.  Medication Inspection Compliance: Pill count conducted under aseptic conditions, in front of the patient. Neither the pills nor the bottle was removed from the patient's sight at any time. Once count was completed pills were immediately returned to the patient in their original bottle.  Medication: Hydrocodone /APAP Pill/Patch Count: 33 of 90 pills/patches remain Pill/Patch Appearance: Markings consistent with prescribed medication Bottle Appearance: Standard pharmacy container. Clearly labeled. Filled Date: 05 / 11 / 2025 Last Medication intake:  Today

## 2024-08-11 ENCOUNTER — Ambulatory Visit
Attending: Student in an Organized Health Care Education/Training Program | Admitting: Student in an Organized Health Care Education/Training Program

## 2024-08-11 ENCOUNTER — Encounter: Payer: Self-pay | Admitting: Student in an Organized Health Care Education/Training Program

## 2024-08-11 VITALS — BP 141/86 | HR 57 | Temp 97.5°F | Resp 16 | Ht 67.0 in | Wt 220.0 lb

## 2024-08-11 DIAGNOSIS — M461 Sacroiliitis, not elsewhere classified: Secondary | ICD-10-CM | POA: Insufficient documentation

## 2024-08-11 DIAGNOSIS — G5701 Lesion of sciatic nerve, right lower limb: Secondary | ICD-10-CM | POA: Diagnosis present

## 2024-08-11 DIAGNOSIS — G8929 Other chronic pain: Secondary | ICD-10-CM | POA: Diagnosis present

## 2024-08-11 DIAGNOSIS — M25511 Pain in right shoulder: Secondary | ICD-10-CM | POA: Insufficient documentation

## 2024-08-11 DIAGNOSIS — M12812 Other specific arthropathies, not elsewhere classified, left shoulder: Secondary | ICD-10-CM | POA: Insufficient documentation

## 2024-08-11 DIAGNOSIS — M25512 Pain in left shoulder: Secondary | ICD-10-CM | POA: Diagnosis present

## 2024-08-11 DIAGNOSIS — M12811 Other specific arthropathies, not elsewhere classified, right shoulder: Secondary | ICD-10-CM | POA: Diagnosis present

## 2024-08-11 DIAGNOSIS — Z9889 Other specified postprocedural states: Secondary | ICD-10-CM | POA: Insufficient documentation

## 2024-08-11 DIAGNOSIS — M533 Sacrococcygeal disorders, not elsewhere classified: Secondary | ICD-10-CM | POA: Insufficient documentation

## 2024-08-11 NOTE — Patient Instructions (Signed)

## 2024-08-11 NOTE — Progress Notes (Signed)
 PROVIDER NOTE: Interpretation of information contained herein should be left to medically-trained personnel. Specific patient instructions are provided elsewhere under Patient Instructions section of medical record. This document was created in part using AI and STT-dictation technology, any transcriptional errors that may result from this process are unintentional.  Patient: Sandy Byrd Crimes  Service: E/M   PCP: Sherial Bail, MD  DOB: Dec 11, 1955  DOS: 08/11/2024  Provider: Wallie Sherry, MD  MRN: [id number]  Delivery: Face-to-face  Specialty: Interventional Pain Management  Type: Established Patient  Setting: Ambulatory outpatient facility  Specialty designation: 09  Referring Prov.: Sherial Bail, MD  Location: Outpatient office facility       History of present illness (HPI) [name], a 69 y.o. year old female, is here today because of her Rotator cuff arthropathy of both shoulders [M12.811, M12.812]. Ms. Leaman's primary complain today is Shoulder Pain (Right scapular area)  Pertinent problems: Ms. Klinck has Lumbar facet joint syndrome; Lumbar degenerative disc disease; Chronic pain syndrome; and H/O rotator cuff surgery (bilateral) on their pertinent problem list.  Pain Assessment: Severity of Chronic pain is reported as a 8 /10. Location: Scapula Right/denies. Onset: More than a month ago. Quality: Sharp. Timing: Constant. Modifying factor(s): ice, stretching. Vitals:  height is 5' 7 (1.702 m) and weight is 220 lb (99.8 kg). Her temporal temperature is 97.5 F (36.4 C) (abnormal). Her blood pressure is 141/86 (abnormal) and her pulse is 57 (abnormal). Her respiration is 16 and oxygen saturation is 100%.  BMI: Estimated body mass index is 34.46 kg/m as calculated from the following:   Height as of this encounter: 5' 7 (1.702 m).   Weight as of this encounter: 220 lb (99.8 kg).  Last encounter: 04/07/2024. Last procedure: 05/20/2024.  Reason for encounter:    History of Present Illness   Sandy Byrd is a 69 year old female who presents for management of right scapular and lower back pain.  She experiences persistent pain in her right scapula, which was previously managed with an injection towards the end of December or January. The injection provided significant relief at the time, but she is now experiencing a recurrence of pain and is seeking further management.  She has chronic lower back pain, which she describes as 'never good'. She has previously undergone radiofrequency ablations (RFAs) which provide relief, but the effects do not last long. Her last RFA was on April 8th. She experiences pain in her SI joint, primarily on the right side, and has found relief from SI joint injections in the past. She is interested in continuing this treatment to manage her symptoms.  Her current medication regimen includes Norco three times a day, Lyrica  at a maximum dose, baclofen, Tylenol , and alprazolam (Xanax). She reports that she is not experiencing side effects such as drowsiness or cognitive impairment.  She uses a walker regularly, both inside and outside her home, to assist with mobility due to her pain.       Pharmacotherapy Assessment   Norco 7.5 mg TID prn   Monitoring: [place] PMP: PDMP reviewed during this encounter.       Pharmacotherapy: No side-effects or adverse reactions reported. Compliance: No problems identified. Effectiveness: Clinically acceptable.  No notes on file  UDS:  Summary  Date Value Ref Range Status  10/01/2023 Note  Final    Comment:    ==================================================================== [facility] (MW) ==================================================================== Test  Result       Flag       Units  Drug Present and Declared for Prescription Verification   Hydrocodone                     366          EXPECTED   ng/mg creat   Hydromorphone                   82           EXPECTED   ng/mg creat   Dihydrocodeine                 98           EXPECTED   ng/mg creat   Norhydrocodone                 624          EXPECTED   ng/mg creat    Sources of hydrocodone  include scheduled prescription medications.    Hydromorphone, dihydrocodeine and norhydrocodone are expected    metabolites of hydrocodone . Hydromorphone and dihydrocodeine are    also available as scheduled prescription medications.  ==================================================================== Test                      Result    Flag   Units      Ref Range   Creatinine              85               mg/dL      >=79 ==================================================================== Declared Medications:  The flagging and interpretation on this report are based on the  following declared medications.  Unexpected results may arise from  inaccuracies in the declared medications.   **Note: The testing scope of this panel includes these medications:   Hydrocodone  (Norco)   **Note: The testing scope of this panel does not include the  following reported medications:   Acetaminophen   Acetaminophen  (Norco)  Albuterol  Baclofen (Lioresal)  Benzonatate  (Tessalon )  Celecoxib (Celebrex)  Cyclosporine (Restasis)  Epinephrine (EpiPen)  Escitalopram (Lexapro)  Fluticasone (Flonase)  Folic Acid  Furosemide (Lasix)  Hydrochlorothiazide (Dyazide)  Ketoconazole (Nizoral)  Levothyroxine (Synthroid)  Magnesium  Metronidazole (MetroGel)  Potassium  Pregabalin  (Lyrica )  Simvastatin (Zocor)  Topical Lidocaine  (Lidoderm )  Triamcinolone (Kenalog)  Triamterene (Dyazide)  Turmeric  Vitamin C  Vitamin D  Vitamin D2 (Drisdol)  Zolpidem (Ambien) ==================================================================== For clinical consultation, please call [phone]. ====================================================================     No results found for: CBDTHCR No  results found for: D8THCCBX No results found for: D9THCCBX  ROS  Constitutional: Denies any fever or chills Gastrointestinal: No reported hemesis, hematochezia, vomiting, or acute GI distress Musculoskeletal: Bilateral SI joint pain, right scapular pain Neurological: No reported episodes of acute onset apraxia, aphasia, dysarthria, agnosia, amnesia, paralysis, loss of coordination, or loss of consciousness  Medication Review  ALPRAZolam, EPINEPHrine, HYDROcodone -acetaminophen , Magnesium, Turmeric, Vitamin D (Ergocalciferol), acetaminophen , albuterol, ascorbic acid, azelastine, baclofen, celecoxib, cyanocobalamin, cycloSPORINE, ergocalciferol, escitalopram, fluticasone, folic acid, furosemide, ketoconazole, levothyroxine, lidocaine , metroNIDAZOLE, mirabegron ER, pilocarpine, potassium chloride SA, pregabalin , rOPINIRole, simvastatin, triamcinolone cream, triamterene-hydrochlorothiazide, and zolpidem  History Review  Allergy: Ms. Weight is allergic to cephalexin, cephalosporins, prochlorperazine, shrimp extract, bee venom, latex, and other. Drug: Ms. Petrov  reports no history of drug use. Alcohol:  reports current alcohol use. Tobacco:  reports that she quit smoking about 22 years ago. Her smoking use included cigarettes.  She has never been exposed to tobacco smoke. She has never used smokeless tobacco. Social: Ms. Capurro  reports that she quit smoking about 22 years ago. Her smoking use included cigarettes. She has never been exposed to tobacco smoke. She has never used smokeless tobacco. She reports current alcohol use. She reports that she does not use drugs. Medical:  has a past medical history of Anxiety, Arthritis, Depression, Hypertension, Rosacea, and Thyroid disease. Surgical: Ms. Basic  has a past surgical history that includes Shoulder surgery (Right, 2016) and Shoulder surgery (Right, 2017). Family: She was adopted. Family history is unknown by  patient.  Laboratory Chemistry Profile   Renal No results found for: BUN, CREATININE, LABCREA, BCR, GFR, GFRAA, GFRNONAA, LABVMA, EPIRU, EPINEPH24HUR, NOREPRU, NOREPI24HUR, DOPARU, [id number]  Hepatic No results found for: AST, ALT, ALBUMIN, ALKPHOS, HCVAB, AMYLASE, LIPASE, AMMONIA  Electrolytes No results found for: NA, K, CL, CALCIUM, MG, PHOS  Bone No results found for: VD25OH, [id number], CI6874NY7, CI7874NY7, 25OHVITD1, 25OHVITD2, 25OHVITD3, TESTOFREE, TESTOSTERONE  Inflammation (CRP: Acute Phase) (ESR: Chronic Phase) No results found for: CRP, ESRSEDRATE, LATICACIDVEN       Note: Above Lab results reviewed.    Physical Exam  Vitals: BP (!) 141/86 (Cuff Size: Large)   Pulse (!) 57   Temp (!) 97.5 F (36.4 C) (Temporal)   Resp 16   Ht 5' 7 (1.702 m)   Wt 220 lb (99.8 kg)   SpO2 100%   BMI 34.46 kg/m  BMI: Estimated body mass index is 34.46 kg/m as calculated from the following:   Height as of this encounter: 5' 7 (1.702 m).   Weight as of this encounter: 220 lb (99.8 kg). Ideal: Ideal body weight: 61.6 kg (135 lb 12.9 oz) Adjusted ideal body weight: 76.9 kg (169 lb 7.7 oz) General appearance: Well nourished, well developed, and well hydrated. In no apparent acute distress Mental status: Alert, oriented x 3 (person, place, & time)       Respiratory: No evidence of acute respiratory distress Eyes: PERLA   Lumbar Spine Area Exam  Skin & Axial Inspection: No masses, redness, or swelling Alignment: Symmetrical Functional ROM: Pain restricted ROM affecting primarily the right >left Stability: No instability detected Muscle Tone/Strength: Functionally intact. No obvious neuro-muscular anomalies detected. Sensory (Neurological): Improved Palpation: No palpable anomalies       Provocative Tests: Hyperextension/rotation test: Pain related to lumbar facet arthropathy Patrick's Maneuver: (+)  for right-sided >left S-I arthralgia  FABER* test: (+) for right-sided >left S-I arthralgia  S-I anterior distraction/compression test: (+) for right-sided >left S-I arthralgia  S-I lateral compression test:(+) for right-sided >left S-I arthralgia  S-I Thigh-thrust test:(+) for right-sided >left S-I arthralgia  S-I Gaenslen's test: (+) for right-sided >left S-I arthralgia    Gait & Posture Assessment  Ambulation: Unassisted Gait: Relatively normal for age and body habitus Posture: WNL  Lower Extremity Exam      Side: Right lower extremity   Side: Left lower extremity  Stability: No instability observed           Stability: No instability observed          Skin & Extremity Inspection: Skin color, temperature, and hair growth are WNL. No peripheral edema or cyanosis. No masses, redness, swelling, asymmetry, or associated skin lesions. No contractures.   Skin & Extremity Inspection: Skin color, temperature, and hair growth are WNL. No peripheral edema or cyanosis. No masses, redness, swelling, asymmetry, or associated skin lesions. No contractures.  Functional ROM: Pain restricted ROM  for knee joint           Functional ROM: Unrestricted ROM                  Muscle Tone/Strength: Functionally intact. No obvious neuro-muscular anomalies detected.   Muscle Tone/Strength: Functionally intact. No obvious neuro-muscular anomalies detected.  Sensory (Neurological): Arthropathic arthralgia         Sensory (Neurological): Unimpaired        DTR: Patellar: deferred today Achilles: deferred today Plantar: deferred today   DTR: Patellar: deferred today Achilles: deferred today Plantar: deferred today  Palpation: No palpable anomalies   Palpation: No palpable anomalies     Assessment   Diagnosis Status  1. Rotator cuff arthropathy of both shoulders   2. H/O rotator cuff surgery (bilateral)   3. Chronic pain of both shoulders (right greater than left)   4. SI joint arthritis (HCC)   5. Sacroiliac  joint pain   6. Piriformis syndrome of right side    Having a Flare-up Having a Flare-up Having a Flare-up   Updated Problems: No problems updated.  Plan of Care  Problem-specific:  Assessment and Plan    Chronic right scapular pain related to rotator cuff arthropathy status post previous suprascapular nerve block which was helpful Chronic right scapular pain, previously managed with suprascapular nerve block injections, which provided 80% pain relief for over 6 months.  Previously done 01/09/2023.  Repeat bilateral suprascapular nerve block.  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 Previous radiofrequency ablation was performed on April 8th, and insurance requires a six-month interval before the next procedure. Current medications include Norco, Lyrica , baclofen, and alprazolam. She is on the maximum dose of Lyrica , and there is concern about increasing hydrocodone  due to concurrent use of alprazolam. Schedule radiofrequency ablation after October 8th. Maintain current medication regimen and consider alternative muscle relaxants if needed.       Ms. JONNAE FONSECA has a current medication list which includes the following long-term medication(s): azelastine, escitalopram, fluticasone, furosemide, levothyroxine, myrbetriq, potassium chloride sa, pregabalin , ropinirole, simvastatin, triamterene-hydrochlorothiazide, ventolin hfa, and  zolpidem.  Pharmacotherapy (Medications Ordered): No orders of the defined types were placed in this encounter.  Orders:  Orders Placed This Encounter  Procedures   SACROILIAC JOINT INJECTION    Physical Examination Findings: Positive Sacral Thrust (Sacral Spring, Downward Pressure): (Y) Positive FABER maneuver (Patrick's): (Y) Positive SI distraction (Gapping): (Y) Positive SI compression (Approximation): (Y) Positive Thigh Thrust:  (Y) Positive Gaenslen's: (Y) Positive Sacral Sulcus Tenderness: (Y)    Standing Status:   Future    Expiration Date:   11/11/2024    Scheduling Instructions:     Procedure: Sacroiliac Joint Injection     Side  Laterality: RIGHT     Sedation: Patient's choice.     Timeframe: As soon as schedule allows.    Where will this procedure be performed?:   [facility]   SUPRASCAPULAR NERVE BLOCK    For shoulder pain.    Standing Status:   Future  Expected Date:   08/25/2024    Expiration Date:   08/11/2025    Scheduling Instructions:     Purpose: Therapeutic     Laterality: Bilateral     Level(s): Suprascapular notch     Sedation: Patient's choice.     Scheduling Timeframe: As permitted by the schedule    Where will this procedure be performed?:   [facility]     R L3,4,5 RFA 02/26/22, 08/29/22, 07/31/23, 03/04/24   Return in about 2 weeks (around 08/25/2024) for B/L SSNB + Right SI-J , in clinic [facility].    Recent Visits Date Type Provider Dept  06/30/24 Office Visit Patel, Seema K, NP [facility]  05/20/24 Procedure visit Marcelino Nurse, MD [facility]  Showing recent visits within past 90 days and meeting all other requirements Today's Visits Date Type Provider Dept  08/11/24 Office Visit Marcelino Nurse, MD [facility]  Showing today's visits and meeting all other requirements Future Appointments Date Type Provider Dept  09/07/24 Appointment Marcelino Nurse, MD [facility]  09/21/24 Appointment  Patel, Seema K, NP [facility]  Showing future appointments within next 90 days and meeting all other requirements  I discussed the assessment and treatment plan with the patient. The patient was provided an opportunity to ask questions and all were answered. The patient agreed with the plan and demonstrated an understanding of the instructions.  Patient advised to call back or seek an in-person evaluation if the symptoms or condition worsens.  Duration of encounter: .  Total time on encounter, as per AMA guidelines included both the face-to-face and non-face-to-face time personally spent by the physician and/or other qualified health care professional(s) on the day of the encounter (includes time in activities that require the physician or other qualified health care professional and does not include time in activities normally performed by clinical staff). Physician's time may include the following activities when performed: Preparing to see the patient (e.g., pre-charting review of records, searching for previously ordered imaging, lab work, and nerve conduction tests) Review of prior analgesic pharmacotherapies. Reviewing PMP Interpreting ordered tests (e.g., lab work, imaging, nerve conduction tests) Performing post-procedure evaluations, including interpretation of diagnostic procedures Obtaining and/or reviewing separately obtained history Performing a medically appropriate examination and/or evaluation Counseling and educating the patient/family/caregiver Ordering medications, tests, or procedures Referring and communicating with other health care professionals (when not separately reported) Documenting clinical information in the electronic or other health record Independently interpreting results (not separately reported) and communicating results to the patient/ family/caregiver Care coordination (not separately reported)  Note by: Nurse Marcelino, MD (TTS and AI  technology used. I apologize for any typographical errors that were not detected and corrected.) Date: 08/11/2024; Time: 2:54 PM

## 2024-09-07 ENCOUNTER — Encounter: Payer: Self-pay | Admitting: Student in an Organized Health Care Education/Training Program

## 2024-09-07 ENCOUNTER — Ambulatory Visit (HOSPITAL_BASED_OUTPATIENT_CLINIC_OR_DEPARTMENT_OTHER): Admitting: Student in an Organized Health Care Education/Training Program

## 2024-09-07 ENCOUNTER — Ambulatory Visit
Admission: RE | Admit: 2024-09-07 | Discharge: 2024-09-07 | Disposition: A | Discharge status: Home / Self Care | Source: Ambulatory Visit | Attending: Student in an Organized Health Care Education/Training Program | Admitting: Student in an Organized Health Care Education/Training Program

## 2024-09-07 VITALS — BP 161/92 | HR 61 | Temp 97.2°F | Resp 15 | Ht 67.0 in | Wt 220.0 lb

## 2024-09-07 DIAGNOSIS — M129 Arthropathy, unspecified: Secondary | ICD-10-CM | POA: Insufficient documentation

## 2024-09-07 DIAGNOSIS — G5703 Lesion of sciatic nerve, bilateral lower limbs: Secondary | ICD-10-CM | POA: Diagnosis not present

## 2024-09-07 DIAGNOSIS — G8929 Other chronic pain: Secondary | ICD-10-CM | POA: Insufficient documentation

## 2024-09-07 DIAGNOSIS — Z9889 Other specified postprocedural states: Secondary | ICD-10-CM

## 2024-09-07 DIAGNOSIS — M533 Sacrococcygeal disorders, not elsewhere classified: Secondary | ICD-10-CM | POA: Diagnosis present

## 2024-09-07 DIAGNOSIS — M461 Sacroiliitis, not elsewhere classified: Secondary | ICD-10-CM

## 2024-09-07 DIAGNOSIS — M25511 Pain in right shoulder: Secondary | ICD-10-CM

## 2024-09-07 DIAGNOSIS — M545 Low back pain, unspecified: Secondary | ICD-10-CM | POA: Diagnosis present

## 2024-09-07 MED ORDER — IOHEXOL 180 MG/ML  SOLN
10.0000 mL | Freq: Once | INTRAMUSCULAR | Status: AC
  Administered 2024-09-07: 10 mL via INTRA_ARTICULAR
  Filled 2024-09-07: qty 20

## 2024-09-07 MED ORDER — ROPIVACAINE HCL 2 MG/ML IJ SOLN
18.0000 mL | Freq: Once | INTRAMUSCULAR | Status: AC
  Administered 2024-09-07: 18 mL via PERINEURAL
  Filled 2024-09-07: qty 20

## 2024-09-07 MED ORDER — LIDOCAINE HCL 2 % IJ SOLN
20.0000 mL | Freq: Once | INTRAMUSCULAR | Status: AC
  Administered 2024-09-07: 400 mg
  Filled 2024-09-07: qty 20

## 2024-09-07 MED ORDER — METHYLPREDNISOLONE ACETATE 80 MG/ML IJ SUSP
80.0000 mg | Freq: Once | INTRAMUSCULAR | Status: AC
  Administered 2024-09-07: 80 mg via INTRA_ARTICULAR
  Filled 2024-09-07: qty 1

## 2024-09-07 MED ORDER — DEXAMETHASONE SODIUM PHOSPHATE 10 MG/ML IJ SOLN
20.0000 mg | Freq: Once | INTRAMUSCULAR | Status: AC
  Administered 2024-09-07: 20 mg
  Filled 2024-09-07: qty 2

## 2024-09-07 NOTE — Progress Notes (Signed)
 Safety precautions to be maintained throughout the outpatient stay will include: orient to surroundings, keep bed in low position, maintain call bell within reach at all times, provide assistance with transfer out of bed and ambulation.

## 2024-09-07 NOTE — Patient Instructions (Signed)

## 2024-09-07 NOTE — Progress Notes (Signed)
 PROVIDER NOTE: Interpretation of information contained herein should be left to medically-trained personnel. Specific patient instructions are provided elsewhere under Patient Instructions section of medical record. This document was created in part using STT-dictation technology, any transcriptional errors that may result from this process are unintentional.  Patient: Sandy Byrd Type: Established DOB: [birth year] MRN: [id number] PCP: Sherial Bail, MD  Service: Procedure DOS: 09/07/2024 Setting: Ambulatory Location: Ambulatory outpatient facility Delivery: Face-to-face Provider: Wallie Sherry, MD Specialty: Interventional Pain Management Specialty designation: 09 Location: Outpatient facility Ref. Prov.: Sherial Bail, MD       Interventional Therapy   ProcedureSacroiliac Joint Steroid Injection and right piriformis TPI Laterality: Bilateral     Level: PIIS (Posterior inferior iliac Spine)  Imaging: Fluoroscopic guidance Anesthesia: Local anesthesia (1-2% Lidocaine ) DOS: 09/07/2024  Performed by: Wallie Sherry, MD  Purpose: Diagnostic/Therapeutic Indications: Sacroiliac joint pain in the lower back and hip area severe enough to impact quality of life or function. Rationale (medical necessity): procedure needed and proper for the diagnosis and/or treatment of Sandy Byrd's medical symptoms and needs. 1. SI joint arthritis (HCC)   2. Sacroiliac joint pain   3. H/O rotator cuff surgery (bilateral)   4. Chronic pain of both shoulders (right greater than left)    NAS-11 Pain score:   Pre-procedure: 8 /10   Post-procedure: 8 /10     Target: Interarticular sacroiliac joint. Location: Medial to the postero-medial edge of iliac spine. Region: Lumbosacral-sacrococcygeal. Approach: Inferior postero-medial percutaneous approach. Type of procedure: Percutaneous joint injection.  Position / Prep / Materials:  Position: Prone  Prep solution: DuraPrep (Iodine Povacrylex  [0.7% available iodine] and Isopropyl Alcohol, 74% w/w) Prep Area: Entire posterior lumbosacral area  Materials:  Tray: Block Needle(s):  Type: Spinal  Gauge (G): 22  Length: 3.5-in Qty: 1  H&P (Pre-op Assessment):  Sandy Byrd is a 69 y.o. (year old), female patient, seen today for interventional treatment. She  has a past surgical history that includes Shoulder surgery (Right, 2016) and Shoulder surgery (Right, 2017). Sandy Byrd has a current medication list which includes the following prescription(s): acetaminophen , ascorbic acid, azelastine, baclofen, celecoxib, cyanocobalamin, cyclosporine, epinephrine, ergocalciferol, escitalopram, fluticasone, folic acid, furosemide, hydrocodone -acetaminophen , [START ON 09/09/2024] hydrocodone -acetaminophen , ketoconazole, levothyroxine, lidocaine , magnesium, metronidazole, myrbetriq, omeprazole, pilocarpine, potassium chloride sa, pregabalin , ropinirole, simvastatin, triamcinolone cream, triamterene-hydrochlorothiazide, ventolin hfa, vitamin d (ergocalciferol), zolpidem, alprazolam, and turmeric, and the following Facility-Administered Medications: dexamethasone , iohexol , lidocaine , methylprednisolone  acetate, and ropivacaine  (pf) 2 mg/ml (0.2%). Her primarily concern today is the Back Pain (Lower back and right shoulder)  Initial Vital Signs:  Pulse/HCG Rate: 61ECG Heart Rate: 72 Temp: (!) 97.2 F (36.2 C) Resp: 16 BP: 131/83 SpO2: 100 %  BMI: Estimated body mass index is 34.46 kg/m as calculated from the following:   Height as of this encounter: 5' 7 (1.702 m).   Weight as of this encounter: 220 lb (99.8 kg).  Risk Assessment: Allergies: Reviewed. She is allergic to cephalexin, cephalosporins, prochlorperazine, shrimp extract, bee venom, latex, and other.  Allergy Precautions: None required Coagulopathies: Reviewed. None identified.  Blood-thinner therapy: None at this time Active Infection(s): Reviewed. None identified. Sandy Byrd  is afebrile  Site Confirmation: Sandy Byrd was asked to confirm the procedure and laterality before marking the site Procedure checklist: Completed Consent: Before the procedure and under the influence of no sedative(s), amnesic(s), or anxiolytics, the patient was informed of the treatment options, risks and possible complications. To fulfill our ethical and legal obligations, as recommended by the American Medical Association's Code of Ethics,  I have informed the patient of my clinical impression; the nature and purpose of the treatment or procedure; the risks, benefits, and possible complications of the intervention; the alternatives, including doing nothing; the risk(s) and benefit(s) of the alternative treatment(s) or procedure(s); and the risk(s) and benefit(s) of doing nothing. The patient was provided information about the general risks and possible complications associated with the procedure. These may include, but are not limited to: failure to achieve desired goals, infection, bleeding, organ or nerve damage, allergic reactions, paralysis, and death. In addition, the patient was informed of those risks and complications associated to the procedure, such as failure to decrease pain; infection; bleeding; organ or nerve damage with subsequent damage to sensory, motor, and/or autonomic systems, resulting in permanent pain, numbness, and/or weakness of one or several areas of the body; allergic reactions; (i.e.: anaphylactic reaction); and/or death. Furthermore, the patient was informed of those risks and complications associated with the medications. These include, but are not limited to: allergic reactions (i.e.: anaphylactic or anaphylactoid reaction(s)); adrenal axis suppression; blood sugar elevation that in diabetics may result in ketoacidosis or comma; water retention that in patients with history of congestive heart failure may result in shortness of breath, pulmonary edema, and  decompensation with resultant heart failure; weight gain; swelling or edema; medication-induced neural toxicity; particulate matter embolism and blood vessel occlusion with resultant organ, and/or nervous system infarction; and/or aseptic necrosis of one or more joints. Finally, the patient was informed that Medicine is not an exact science; therefore, there is also the possibility of unforeseen or unpredictable risks and/or possible complications that may result in a catastrophic outcome. The patient indicated having understood very clearly. We have given the patient no guarantees and we have made no promises. Enough time was given to the patient to ask questions, all of which were answered to the patient's satisfaction. Ms. Lightsey has indicated that she wanted to continue with the procedure. Attestation: I, the ordering provider, attest that I have discussed with the patient the benefits, risks, side-effects, alternatives, likelihood of achieving goals, and potential problems during recovery for the procedure that I have provided informed consent. Date  Time: 09/07/2024 11:06 AM  Pre-Procedure Preparation:  Monitoring: As per clinic protocol. Respiration, ETCO2, SpO2, BP, heart rate and rhythm monitor placed and checked for adequate function Safety Precautions: Patient was assessed for positional comfort and pressure points before starting the procedure. Time-out: I initiated and conducted the Time-out before starting the procedure, as per protocol. The patient was asked to participate by confirming the accuracy of the Time Out information. Verification of the correct person, site, and procedure were performed and confirmed by me, the nursing staff, and the patient. Time-out conducted as per Joint Commission's Universal Protocol (UP.01.01.01). Time: 1154 Start Time: 1154 hrs.  Description/Narrative of Procedure:          Start Time: 1154 hrs.  Rationale (medical necessity): procedure needed  and proper for the diagnosis and/or treatment of the patient's medical symptoms and needs. Procedural Technique Safety Precautions: Aspiration looking for blood return was conducted prior to all injections. At no point did we inject any substances, as a needle was being advanced. No attempts were made at seeking any paresthesias. Safe injection practices and needle disposal techniques used. Medications properly checked for expiration dates. SDV (single dose vial) medications used. Description of the Procedure: Protocol guidelines were followed. The patient was assisted into a comfortable position. The target area was identified and the area prepped in the usual manner.  Skin & deeper tissues infiltrated with local anesthetic. Appropriate amount of time allowed to pass for local anesthetics to take effect. The procedure needles were then advanced to the target area. Proper needle placement secured. Negative aspiration confirmed. Solution injected in intermittent fashion, asking for systemic symptoms every 0.5cc of injectate. The needles were then removed and the area cleansed, making sure to leave some of the prepping solution back to take advantage of its long term bactericidal properties.  Technical description of procedure:  Fluoroscopy using a posterior anterior 45 degree angle from the midline aiming at the anterolateral aspect of the patient was used to find a direct path into the sacroiliac joint, the superior medial to posterior superior iliac spine.  The skin was marked where the desired target and the skin infiltrated with local anesthetics.  The procedure needle was then advanced until the joint was entered.  Once inside of the joint, we then proceeded to inject the desired solution.  10 cc solution made of 9 cc of 0.2% ropivacaine , 1 cc of methylprednisolone , 80 mg/cc.  5 cc injected into the right SI joint after contrast confirmation.5 cc injected into the left SI joint after contrast  confirmation.  Afterwards a right and left piriformis trigger point injection was done 1 cm inferior, 1 cm deep, 1 cm lateral to the inferior fissure of the SI joint.  Contrast was injected to confirm piriformis muscle striation.  10 cc solution made of 9 cc of 0.2% ropivacaine , 1 cc of Decadron  10 mg/cc; 5 cc injected into the right piriformis muscle, 5 cc injected into the left piriformis muscle.  While injecting, patient did not complain of any pain radiating down her right or left leg.   Vitals:   09/07/24 1110 09/07/24 1154 09/07/24 1159  BP: 131/83 (!) 144/94 (!) 159/96  Pulse: 61    Resp:  16 16  Temp: (!) 97.2 F (36.2 C)    SpO2: 100% 100% 100%  Weight: 220 lb (99.8 kg)    Height: 5' 7 (1.702 m)       End Time: 1201 hrs.  Imaging Guidance (Non-Spinal):          Type of Imaging Technique: Fluoroscopy Guidance (Non-Spinal) Indication(s): Assistance in needle guidance and placement for procedures requiring needle placement in or near specific anatomical locations not easily accessible without such assistance. Exposure Time: Please see nurses notes. Contrast: Before injecting any contrast, we confirmed that the patient did not have an allergy to iodine, shellfish, or radiological contrast. Once satisfactory needle placement was completed at the desired level, radiological contrast was injected. Contrast injected under live fluoroscopy. No contrast complications. See chart for type and volume of contrast used. Fluoroscopic Guidance: I was personally present during the use of fluoroscopy. Tunnel Vision Technique used to obtain the best possible view of the target area. Parallax error corrected before commencing the procedure. Direction-depth-direction technique used to introduce the needle under continuous pulsed fluoroscopy. Once target was reached, antero-posterior, oblique, and lateral fluoroscopic projection used confirm needle placement in all planes. Images permanently stored in  EMR. Interpretation: I personally interpreted the imaging intraoperatively. Adequate needle placement confirmed in multiple planes. Appropriate spread of contrast into desired area was observed. No evidence of afferent or efferent intravascular uptake. Permanent images saved into the patient's record.  Post-operative Assessment:  Post-procedure Vital Signs:  Pulse/HCG Rate: 6162 Temp: (!) 97.2 F (36.2 C) Resp: 16 BP: (!) 159/96 SpO2: 100 %  EBL: None  Complications: No immediate post-treatment complications observed  by team, or reported by patient.  Note: The patient tolerated the entire procedure well. A repeat set of vitals were taken after the procedure and the patient was kept under observation following institutional policy, for this type of procedure. Post-procedural neurological assessment was performed, showing return to baseline, prior to discharge. The patient was provided with post-procedure discharge instructions, including a section on how to identify potential problems. Should any problems arise concerning this procedure, the patient was given instructions to immediately contact us , at any time, without hesitation. In any case, we plan to contact the patient by telephone for a follow-up status report regarding this interventional procedure.  Comments:  No additional relevant information.  Plan of Care (POC)  Orders:  Orders Placed This Encounter  Procedures   DG PAIN [facility] C-ARM 1-60 MIN NO REPORT    Intraoperative interpretation by procedural physician at [facility].    Standing Status:   Standing    Number of Occurrences:   1    Reason for exam::   Assistance in needle guidance and placement for procedures requiring needle placement in or near specific anatomical locations not easily accessible without such assistance.     Medications ordered for procedure: Meds ordered this encounter  Medications   iohexol  (OMNIPAQUE ) 180 MG/ML injection 10 mL     Must be Myelogram-compatible. If not available, you may substitute with a water-soluble, non-ionic, hypoallergenic, myelogram-compatible radiological contrast medium.   lidocaine  (XYLOCAINE ) 2 % (with pres) injection 400 mg   ropivacaine  (PF) 2 mg/mL (0.2%) (NAROPIN ) injection 18 mL   dexamethasone  (DECADRON ) injection 20 mg   methylPREDNISolone  acetate (DEPO-MEDROL ) injection 80 mg   Medications administered: Olam EMERSON Crimes had no medications administered during this visit.  See the medical record for exact dosing, route, and time of administration.  Follow-up plan:   Return for Keep sch. appt.       Recent Visits Date Type Provider Dept  08/11/24 Office Visit Marcelino Nurse, MD [facility]  06/30/24 Office Visit Patel, Seema K, NP [facility]  Showing recent visits within past 90 days and meeting all other requirements Today's Visits Date Type Provider Dept  09/07/24 Procedure visit Marcelino Nurse, MD [facility]  Showing today's visits and meeting all other requirements Future Appointments Date Type Provider Dept  09/21/24 Appointment Patel, Seema K, NP [facility]  Showing future appointments within next 90 days and meeting all other requirements  Disposition: Discharge home  Discharge (Date  Time): 09/07/2024;   hrs.   Primary Care Physician: Sherial Bail, MD Location: [facility] Note by: Nurse Marcelino, MD (TTS technology used. I apologize for any typographical errors that were not detected and corrected.) Date: 09/07/2024; Time: 12:06 PM  Disclaimer:  Medicine is not an Visual merchandiser. The only guarantee in medicine is that nothing is guaranteed. It is important to note that the decision to proceed with this intervention was based on the information collected from the patient. The Data and conclusions were drawn from the patient's questionnaire, the interview, and the physical examination. Because the  information was provided in large part by the patient, it cannot be guaranteed that it has not been purposely or unconsciously manipulated. Every effort has been made to obtain as much relevant data as possible for this evaluation. It is important to note that the conclusions that lead to this procedure are derived in large part from the available data. Always take into account that the treatment will  also be dependent on availability of resources and existing treatment guidelines, considered by other Pain Management Practitioners as being common knowledge and practice, at the time of the intervention. For Medico-Legal purposes, it is also important to point out that variation in procedural techniques and pharmacological choices are the acceptable norm. The indications, contraindications, technique, and results of the above procedure should only be interpreted and judged by a Board-Certified Interventional Pain Specialist with extensive familiarity and expertise in the same exact procedure and technique.

## 2024-09-07 NOTE — Progress Notes (Signed)
 PROVIDER NOTE: Interpretation of information contained herein should be left to medically-trained personnel. Specific patient instructions are provided elsewhere under Patient Instructions section of medical record. This document was created in part using STT-dictation technology, any transcriptional errors that may result from this process are unintentional.  Patient: Sandy Byrd Type: Established DOB: September 24, 1955 MRN: [id number] PCP: Sherial Bail, MD  Service: Procedure DOS: 09/07/2024 Setting: Ambulatory Location: Ambulatory outpatient facility Delivery: Face-to-face Provider: Wallie Sherry, MD Specialty: Interventional Pain Management Specialty designation: 09 Location: Outpatient facility Ref. Prov.: Sherial Bail, MD       Interventional Therapy   Procedure: Suprascapular nerve block (SSNB) #1  Laterality:  Right  Level: Superior to scapular spine, lateral to supraspinatus fossa (Suprascapular notch).  Imaging: Fluoroscopic guidance         Anesthesia: Local anesthesia (1-2% Lidocaine ) DOS: 09/07/2024  Performed by: Wallie Sherry, MD  Purpose: Diagnostic/Therapeutic Indications: Shoulder pain, severe enough to impact quality of life and/or function. Right rotator cuff arthropathy  NAS-11 score:   Pre-procedure: 8 /10   Post-procedure: 0-No pain/10     Target: Suprascapular nerve Location: midway between the medial border of the scapula and the acromion as it runs through the suprascapular notch. Region: Suprascapular, posterior shoulder  Approach: Percutaneous  Neuroanatomy: The suprascapular nerve is the lateral branch of the superior trunk of the brachial plexus. It receives nerve fibers that originate in the nerve roots C5 and C6 (and sometimes C4). It is a mixed nerve, meaning that it provides both sensory and motor supply for the suprascapular region. Function: The main function of this nerve is to provide motor innervation for two muscles, the  supraspinatus and infraspinatus muscles. They are part of the rotator cuff muscles. In addition, the suprascapular nerve provides a sensory supply to the joints of the scapula (glenohumeral and acromioclavicular joints). Rationale (medical necessity): procedure needed and proper for the diagnosis and/or treatment of the patient's medical symptoms and needs.  Position / Prep / Materials:  Position: Prone Materials:  Tray: Block Needle(s):  Type: Spinal  Gauge (G): 22  Length: 3.5 in.  Qty: 1 Prep solution: ChloraPrep (2% chlorhexidine gluconate and 70% isopropyl alcohol) Prep Area: Entire posterior shoulder area. From upper spine to shoulder proper (upper arm), and from lateral neck to lower tip of shoulder blade.   H&P (Pre-op Assessment):  Sandy Byrd is a 69 y.o. (year old), female patient, seen today for interventional treatment. She  has a past surgical history that includes Shoulder surgery (Right, 2016) and Shoulder surgery (Right, 2017). Sandy Byrd has a current medication list which includes the following prescription(s): acetaminophen , ascorbic acid, azelastine, baclofen, celecoxib, cyanocobalamin, cyclosporine, epinephrine, ergocalciferol, escitalopram, fluticasone, folic acid, furosemide, hydrocodone -acetaminophen , [START ON 09/09/2024] hydrocodone -acetaminophen , ketoconazole, levothyroxine, lidocaine , magnesium, metronidazole, myrbetriq, omeprazole, pilocarpine, potassium chloride sa, pregabalin , ropinirole, simvastatin, triamcinolone cream, triamterene-hydrochlorothiazide, ventolin hfa, vitamin d (ergocalciferol), zolpidem, alprazolam, and turmeric, and the following Facility-Administered Medications: iohexol , lidocaine , methylprednisolone  acetate, and ropivacaine  (pf) 2 mg/ml (0.2%). Her primarily concern today is the Back Pain (Lower back and right shoulder)  Initial Vital Signs:  Pulse/HCG Rate: 61ECG Heart Rate: 72 Temp: (!) 97.2 F (36.2 C) Resp: 16 BP: 131/83 SpO2:  100 %  BMI: Estimated body mass index is 34.46 kg/m as calculated from the following:   Height as of this encounter: 5' 7 (1.702 m).   Weight as of this encounter: 220 lb (99.8 kg).  Risk Assessment: Allergies: Reviewed. She is allergic to cephalexin, cephalosporins, prochlorperazine, shrimp extract, bee venom, latex, and other.  Allergy Precautions: None required Coagulopathies: Reviewed. None identified.  Blood-thinner therapy: None at this time Active Infection(s): Reviewed. None identified. Sandy Byrd is afebrile  Site Confirmation: Sandy Byrd was asked to confirm the procedure and laterality before marking the site Procedure checklist: Completed Consent: Before the procedure and under the influence of no sedative(s), amnesic(s), or anxiolytics, the patient was informed of the treatment options, risks and possible complications. To fulfill our ethical and legal obligations, as recommended by the American Medical Association's Code of Ethics, I have informed the patient of my clinical impression; the nature and purpose of the treatment or procedure; the risks, benefits, and possible complications of the intervention; the alternatives, including doing nothing; the risk(s) and benefit(s) of the alternative treatment(s) or procedure(s); and the risk(s) and benefit(s) of doing nothing. The patient was provided information about the general risks and possible complications associated with the procedure. These may include, but are not limited to: failure to achieve desired goals, infection, bleeding, organ or nerve damage, allergic reactions, paralysis, and death. In addition, the patient was informed of those risks and complications associated to the procedure, such as failure to decrease pain; infection; bleeding; organ or nerve damage with subsequent damage to sensory, motor, and/or autonomic systems, resulting in permanent pain, numbness, and/or weakness of one or several areas of the  body; allergic reactions; (i.e.: anaphylactic reaction); and/or death. Furthermore, the patient was informed of those risks and complications associated with the medications. These include, but are not limited to: allergic reactions (i.e.: anaphylactic or anaphylactoid reaction(s)); adrenal axis suppression; blood sugar elevation that in diabetics may result in ketoacidosis or comma; water retention that in patients with history of congestive heart failure may result in shortness of breath, pulmonary edema, and decompensation with resultant heart failure; weight gain; swelling or edema; medication-induced neural toxicity; particulate matter embolism and blood vessel occlusion with resultant organ, and/or nervous system infarction; and/or aseptic necrosis of one or more joints. Finally, the patient was informed that Medicine is not an exact science; therefore, there is also the possibility of unforeseen or unpredictable risks and/or possible complications that may result in a catastrophic outcome. The patient indicated having understood very clearly. We have given the patient no guarantees and we have made no promises. Enough time was given to the patient to ask questions, all of which were answered to the patient's satisfaction. Ms. Latu has indicated that she wanted to continue with the procedure. Attestation: I, the ordering provider, attest that I have discussed with the patient the benefits, risks, side-effects, alternatives, likelihood of achieving goals, and potential problems during recovery for the procedure that I have provided informed consent. Date  Time: 09/07/2024 11:06 AM  Pre-Procedure Preparation:  Monitoring: As per clinic protocol. Respiration, ETCO2, SpO2, BP, heart rate and rhythm monitor placed and checked for adequate function Safety Precautions: Patient was assessed for positional comfort and pressure points before starting the procedure. Time-out: I initiated and conducted the  Time-out before starting the procedure, as per protocol. The patient was asked to participate by confirming the accuracy of the Time Out information. Verification of the correct person, site, and procedure were performed and confirmed by me, the nursing staff, and the patient. Time-out conducted as per Joint Commission's Universal Protocol (UP.01.01.01). Time: 1154 Start Time: 1154 hrs.  Description of Procedure:          Procedural Technique Safety Precautions: Aspiration looking for blood return was conducted prior to all injections. At no point did we inject any substances,  as a needle was being advanced. No attempts were made at seeking any paresthesias. Safe injection practices and needle disposal techniques used. Medications properly checked for expiration dates. SDV (single dose vial) medications used. Description of the Procedure: Protocol guidelines were followed. The patient was placed in position over the procedure table. The target area was identified and the area prepped in the usual manner. Skin & deeper tissues infiltrated with local anesthetic. Appropriate amount of time allowed to pass for local anesthetics to take effect. The procedure needles were then advanced to the target area. Proper needle placement secured. Negative aspiration confirmed. Solution injected in intermittent fashion, asking for systemic symptoms every 0.5cc of injectate. The needles were then removed and the area cleansed, making sure to leave some of the prepping solution back to take advantage of its long term bactericidal properties.  Vitals:   09/07/24 1110 09/07/24 1154 09/07/24 1159 09/07/24 1204  BP: 131/83 (!) 144/94 (!) 159/96 (!) 161/92  Pulse: 61     Resp:  16 16 15   Temp: (!) 97.2 F (36.2 C)     SpO2: 100% 100% 100% 100%  Weight: 220 lb (99.8 kg)     Height: 5' 7 (1.702 m)       5 cc solution made of 4 cc of 0.2% ropivacaine , 1 cc of Decadron  10 mg/cc.  Injected along the right  suprascapular nerve  Start Time: 1154 hrs. End Time: 1201 hrs.  Imaging Guidance (Spinal):         Type of Imaging Technique: Fluoroscopy Guidance (Spinal) Indication(s): Fluoroscopy guidance for needle placement to enhance accuracy in procedures requiring precise needle localization for targeted delivery of medication in or near specific anatomical locations not easily accessible without such real-time imaging assistance. Exposure Time: Please see nurses notes. Contrast: Before injecting any contrast, we confirmed that the patient did not have an allergy to iodine, shellfish, or radiological contrast. Once satisfactory needle placement was completed at the desired level, radiological contrast was injected. Contrast injected under live fluoroscopy. No contrast complications. See chart for type and volume of contrast used. Fluoroscopic Guidance: I was personally present during the use of fluoroscopy. Tunnel Vision Technique used to obtain the best possible view of the target area. Parallax error corrected before commencing the procedure. Direction-depth-direction technique used to introduce the needle under continuous pulsed fluoroscopy. Once target was reached, antero-posterior, oblique, and lateral fluoroscopic projection used confirm needle placement in all planes. Images permanently stored in EMR. Interpretation: I personally interpreted the imaging intraoperatively. Adequate needle placement confirmed in multiple planes. Appropriate spread of contrast into desired area was observed. No evidence of afferent or efferent intravascular uptake. No intrathecal or subarachnoid spread observed. Permanent images saved into the patient's record.  Antibiotic Prophylaxis:   Anti-infectives (From admission, onward)    None      Indication(s): None identified  Post-operative Assessment:  Post-procedure Vital Signs:  Pulse/HCG Rate: 6160 Temp: (!) 97.2 F (36.2 C) Resp: 15 BP: (!) 161/92 SpO2:  100 %  EBL: None  Complications: No immediate post-treatment complications observed by team, or reported by patient.  Note: The patient tolerated the entire procedure well. A repeat set of vitals were taken after the procedure and the patient was kept under observation following institutional policy, for this type of procedure. Post-procedural neurological assessment was performed, showing return to baseline, prior to discharge. The patient was provided with post-procedure discharge instructions, including a section on how to identify potential problems. Should any problems arise concerning this procedure,  the patient was given instructions to immediately contact us , at any time, without hesitation. In any case, we plan to contact the patient by telephone for a follow-up status report regarding this interventional procedure.  Comments:  No additional relevant information.  Plan of Care (POC)  Orders:  Orders Placed This Encounter  Procedures   DG PAIN [facility] C-ARM 1-60 MIN NO REPORT    Intraoperative interpretation by procedural physician at [facility].    Standing Status:   Standing    Number of Occurrences:   1    Reason for exam::   Assistance in needle guidance and placement for procedures requiring needle placement in or near specific anatomical locations not easily accessible without such assistance.    Medications ordered for procedure: Meds ordered this encounter  Medications   iohexol  (OMNIPAQUE ) 180 MG/ML injection 10 mL    Must be Myelogram-compatible. If not available, you may substitute with a water-soluble, non-ionic, hypoallergenic, myelogram-compatible radiological contrast medium.   lidocaine  (XYLOCAINE ) 2 % (with pres) injection 400 mg   ropivacaine  (PF) 2 mg/mL (0.2%) (NAROPIN ) injection 18 mL   dexamethasone  (DECADRON ) injection 20 mg   methylPREDNISolone  acetate (DEPO-MEDROL ) injection 80 mg   Medications administered: We administered dexamethasone .   See the medical record for exact dosing, route, and time of administration.   Follow-up plan:   Return for Keep sch. appt.     Recent Visits Date Type Provider Dept  08/11/24 Office Visit Marcelino Nurse, MD [facility]  06/30/24 Office Visit Patel, Seema K, NP [facility]  Showing recent visits within past 90 days and meeting all other requirements Today's Visits Date Type Provider Dept  09/07/24 Procedure visit Marcelino Nurse, MD [facility]  Showing today's visits and meeting all other requirements Future Appointments Date Type Provider Dept  09/21/24 Appointment Patel, Seema K, NP [facility]  Showing future appointments within next 90 days and meeting all other requirements   Disposition: Discharge home  Discharge (Date  Time): 09/07/2024; 1206 hrs.   Primary Care Physician: Sherial Bail, MD Location: [facility] Note by: Nurse Marcelino, MD (TTS technology used. I apologize for any typographical errors that were not detected and corrected.) Date: 09/07/2024; Time: 12:07 PM  Disclaimer:  Medicine is not an Visual merchandiser. The only guarantee in medicine is that nothing is guaranteed. It is important to note that the decision to proceed with this intervention was based on the information collected from the patient. The Data and conclusions were drawn from the patient's questionnaire, the interview, and the physical examination. Because the information was provided in large part by the patient, it cannot be guaranteed that it has not been purposely or unconsciously manipulated. Every effort has been made to obtain as much relevant data as possible for this evaluation. It is important to note that the conclusions that lead to this procedure are derived in large part from the available data. Always take into account that the treatment will also be dependent on availability of resources and existing treatment guidelines,  considered by other Pain Management Practitioners as being common knowledge and practice, at the time of the intervention. For Medico-Legal purposes, it is also important to point out that variation in procedural techniques and pharmacological choices are the acceptable norm. The indications, contraindications, technique, and results of the above procedure should only be interpreted and judged by a Board-Certified Interventional Pain Specialist with extensive familiarity and expertise in the same exact procedure and technique.

## 2024-09-08 ENCOUNTER — Telehealth: Payer: Self-pay

## 2024-09-08 NOTE — Telephone Encounter (Signed)
 Post procedure follow up.  Patient states she is doing well.   ?

## 2024-09-21 ENCOUNTER — Ambulatory Visit: Attending: Nurse Practitioner | Admitting: Nurse Practitioner

## 2024-09-21 ENCOUNTER — Encounter: Payer: Self-pay | Admitting: Nurse Practitioner

## 2024-09-21 VITALS — BP 128/80 | HR 70 | Temp 97.1°F | Resp 18 | Ht 67.0 in | Wt 220.0 lb

## 2024-09-21 DIAGNOSIS — G5703 Lesion of sciatic nerve, bilateral lower limbs: Secondary | ICD-10-CM | POA: Diagnosis present

## 2024-09-21 DIAGNOSIS — M25561 Pain in right knee: Secondary | ICD-10-CM | POA: Diagnosis present

## 2024-09-21 DIAGNOSIS — M7918 Myalgia, other site: Secondary | ICD-10-CM | POA: Insufficient documentation

## 2024-09-21 DIAGNOSIS — M461 Sacroiliitis, not elsewhere classified: Secondary | ICD-10-CM | POA: Insufficient documentation

## 2024-09-21 DIAGNOSIS — M533 Sacrococcygeal disorders, not elsewhere classified: Secondary | ICD-10-CM | POA: Diagnosis present

## 2024-09-21 DIAGNOSIS — G8929 Other chronic pain: Secondary | ICD-10-CM | POA: Diagnosis present

## 2024-09-21 DIAGNOSIS — G894 Chronic pain syndrome: Secondary | ICD-10-CM | POA: Insufficient documentation

## 2024-09-21 DIAGNOSIS — Z79899 Other long term (current) drug therapy: Secondary | ICD-10-CM | POA: Insufficient documentation

## 2024-09-21 MED ORDER — HYDROCODONE-ACETAMINOPHEN 7.5-325 MG PO TABS: 1.0000 | ORAL_TABLET | Freq: Three times a day (TID) | ORAL | 0 refills | Status: DC | PRN

## 2024-09-21 NOTE — Progress Notes (Signed)
 Nursing Pain Medication Assessment:  Safety precautions to be maintained throughout the outpatient stay will include: orient to surroundings, keep bed in low position, maintain call bell within reach at all times, provide assistance with transfer out of bed and ambulation.  Medication Inspection Compliance: Pill count conducted under aseptic conditions, in front of the patient. Neither the pills nor the bottle was removed from the patient's sight at any time. Once count was completed pills were immediately returned to the patient in their original bottle.  Medication: Hydrocodone /APAP Pill/Patch Count: 70 of 90 pills/patches remain Pill/Patch Appearance: Markings consistent with prescribed medication Bottle Appearance: Standard pharmacy container. Clearly labeled. Filled Date: 09 / 16 / 2025 Last Medication intake:  Today

## 2024-09-21 NOTE — Progress Notes (Signed)
 PROVIDER NOTE: Interpretation of information contained herein should be left to medically-trained personnel. Specific patient instructions are provided elsewhere under Patient Instructions section of medical record. This document was created in part using AI and STT-dictation technology, any transcriptional errors that may result from this process are unintentional.  Patient: Sandy Byrd Crimes  Service: E/M   PCP: Sherial Bail, MD  DOB: [birth year]  DOS: 09/21/2024  Provider: Emmy MARLA Blanch, NP  MRN: [id number]  Delivery: Face-to-face  Specialty: Interventional Pain Management  Type: Established Patient  Setting: Ambulatory outpatient facility  Specialty designation: 09  Referring Prov.: Sherial Bail, MD  Location: Outpatient office facility       History of present illness (HPI) Sandy Byrd, a 69 y.o. year old female, is here today because of her Chronic pain syndrome [G89.4]. Sandy Byrd's primary complain today is Back Pain (Lower Back, Radiates into Right Hip and Right Buttock )  Pertinent problems: Sandy Byrd has  Lumbar spondylosis; Lumbar degenerative disc disease; Chronic pain syndrome; and History of rotator cuff surgery (bilateral on their pertinent problem list  Pain Assessment: Severity of Chronic pain is reported as a 8 /10. Location: Back Lower/Right Hip and Right Buttock. Onset: More than a month ago. Quality: Aching, Constant, Stabbing. Timing: Constant. Modifying factor(s): Medication. Vitals:  height is 5' 7 (1.702 m) and weight is 220 lb (99.8 kg). Her temporal temperature is 97.1 F (36.2 C) (abnormal). Her blood pressure is 128/80 and her pulse is 70. Her respiration is 18 and oxygen saturation is 100%.  BMI: Estimated body mass index is 34.46 kg/m as calculated from the following:   Height as of this encounter: 5' 7 (1.702 m).   Weight as of this encounter: 220 lb (99.8 kg).  Last encounter: 06/30/2024. Last procedure: 09/07/2024  Reason for  encounter: both, medication management and post-procedure evaluation and assessment. No change in medical history since last visit.  Patient's pain is at baseline.  Patient continues multimodal pain regimen as prescribed.  States that it provides pain relief and improvement in functional status.   [name] underwent a diagnostic/therapeutic Sacroiliac Joint Steroid Injection and right piriformis TPI and Suprascapular nerve block on September 07, 2024. She reports 100 % pain relief and functional improvement during local anesthetic phase, followed sustain 60% ongoing pain relief and functional improvement since the procedure.   Procedure ProcedureSacroiliac Joint Steroid Injection and right piriformis TPI Laterality: Bilateral     Level: PIIS (Posterior inferior iliac Spine)  Imaging: Fluoroscopic guidance Anesthesia: Local anesthesia (1-2% Lidocaine ) DOS: 09/07/2024  Performed by: Wallie Sherry, MD   Purpose: Diagnostic/Therapeutic Indications: Sacroiliac joint pain in the lower back and hip area severe enough to impact quality of life or function. Rationale (medical necessity): procedure needed and proper for the diagnosis and/or treatment of Sandy Byrd's medical symptoms and needs. 1. SI joint arthritis (HCC)   2. Sacroiliac joint pain   3. H/O rotator cuff surgery (bilateral)   4. Chronic pain of both shoulders (right greater than left)     NAS-11 Pain score:        Pre-procedure: 8 /10        Post-procedure: 8 /10   Procedure Procedure: Suprascapular nerve block (SSNB) #1  Laterality:  Right  Level: Superior to scapular spine, lateral to supraspinatus fossa (Suprascapular notch).  Imaging: Fluoroscopic guidance         Anesthesia: Local anesthesia (1-2% Lidocaine ) DOS: 09/07/2024  Performed by: Wallie Sherry, MD   Purpose: Diagnostic/Therapeutic Indications: Shoulder pain,  severe enough to impact quality of life and/or function. Right rotator cuff arthropathy   NAS-11  score:         Pre-procedure: 8 /10         Post-procedure: 0-No pain/10   Post-Procedure Evaluation    Effectiveness:  Initial hour after procedure:   100%. Subsequent 4-6 hours post-procedure:   100%. Analgesia past initial 6 hours:   60% ongoing . Ongoing improvement:  Analgesic:  Sandy Byrd underwent a diagnostic/therapeutic Sacroiliac Joint Steroid Injection and right piriformis TPI and Suprascapular nerve block on September 07, 2024. She reports 100 % pain relief and functional improvement during local anesthetic phase, followed sustain 60% ongoing pain relief and functional improvement since the procedure.    Function: Sandy Byrd reports improvement in function ROM: [name] reports improvement in ROM  Pharmacotherapy Assessment   Analgesic:  Hydrocodone -acetaminophen  (Norco) 7.5-325 mg tab every 8 hours as needed for pain.  MME = 22  Monitoring: [place] PMP: PDMP reviewed during this encounter.       Pharmacotherapy: No side-effects or adverse reactions reported. Compliance: No problems identified. Effectiveness: Clinically acceptable.  Sandy Byrd, [place]  09/21/2024  1:23 PM  Sign when Signing Visit Nursing Pain Medication Assessment:  Safety precautions to be maintained throughout the outpatient stay will include: orient to surroundings, keep bed in low position, maintain call bell within reach at all times, provide assistance with transfer out of bed and ambulation.  Medication Inspection Compliance: Pill count conducted under aseptic conditions, in front of the patient. Neither the pills nor the bottle was removed from the patient's sight at any time. Once count was completed pills were immediately returned to the patient in their original bottle.  Medication: Hydrocodone /APAP Pill/Patch Count: 70 of 90 pills/patches remain Pill/Patch Appearance: Markings consistent with prescribed medication Bottle Appearance: Standard pharmacy container. Clearly labeled. Filled  Date: 09 / 16 / 2025 Last Medication intake:  Today    UDS:  Summary  Date Value Ref Range Status  10/01/2023 Note  Final    Comment:    ==================================================================== [employer] (MW) ==================================================================== Test                             Result       Flag       Units  Drug Present and Declared for Prescription Verification   Hydrocodone                     366          EXPECTED   ng/mg creat   Hydromorphone                  82           EXPECTED   ng/mg creat   Dihydrocodeine                 98           EXPECTED   ng/mg creat   Norhydrocodone                 624          EXPECTED   ng/mg creat    Sources of hydrocodone  include scheduled prescription medications.    Hydromorphone, dihydrocodeine and norhydrocodone are expected    metabolites of hydrocodone . Hydromorphone and dihydrocodeine are    also available as scheduled prescription medications.  ==================================================================== Test  Result    Flag   Units      Ref Range   Creatinine              85               mg/dL      >=79 ==================================================================== Declared Medications:  The flagging and interpretation on this report are based on the  following declared medications.  Unexpected results may arise from  inaccuracies in the declared medications.   **Note: The testing scope of this panel includes these medications:   Hydrocodone  (Norco)   **Note: The testing scope of this panel does not include the  following reported medications:   Acetaminophen   Acetaminophen  (Norco)  Albuterol  Baclofen (Lioresal)  Benzonatate  (Tessalon )  Celecoxib (Celebrex)  Cyclosporine (Restasis)  Epinephrine (EpiPen)  Escitalopram (Lexapro)  Fluticasone (Flonase)  Folic Acid  Furosemide (Lasix)  Hydrochlorothiazide (Dyazide)  Ketoconazole  (Nizoral)  Levothyroxine (Synthroid)  Magnesium  Metronidazole (MetroGel)  Potassium  Pregabalin  (Lyrica )  Simvastatin (Zocor)  Topical Lidocaine  (Lidoderm )  Triamcinolone (Kenalog)  Triamterene (Dyazide)  Turmeric  Vitamin C  Vitamin D  Vitamin D2 (Drisdol)  Zolpidem (Ambien) ==================================================================== For clinical consultation, please call [phone]. ====================================================================     No results found for: CBDTHCR No results found for: D8THCCBX No results found for: D9THCCBX  ROS  Constitutional: Denies any fever or chills Gastrointestinal: No reported hemesis, hematochezia, vomiting, or acute GI distress Musculoskeletal: Back Pain (Lower Back, Radiates into Right Hip and Right Buttock ) Neurological: No reported episodes of acute onset apraxia, aphasia, dysarthria, agnosia, amnesia, paralysis, loss of coordination, or loss of consciousness  Medication Review  EPINEPHrine, HYDROcodone -acetaminophen , Magnesium, Turmeric, Vitamin D (Ergocalciferol), acetaminophen , albuterol, ascorbic acid, azelastine, baclofen, celecoxib, cyanocobalamin, cycloSPORINE, ergocalciferol, escitalopram, fluticasone, folic acid, furosemide, ketoconazole, levothyroxine, lidocaine , metroNIDAZOLE, mirabegron ER, omeprazole, pilocarpine, potassium chloride SA, pregabalin , rOPINIRole, simvastatin, triamcinolone cream, triamterene-hydrochlorothiazide, and zolpidem  History Review  Allergy: Ms. Mikus is allergic to cephalexin, cephalosporins, prochlorperazine, shrimp extract, bee venom, latex, and other. Drug: Ms. Vossler  reports no history of drug use. Alcohol:  reports current alcohol use. Tobacco:  reports that she quit smoking about 22 years ago. Her smoking use included cigarettes. She has never been exposed to tobacco smoke. She has never used smokeless tobacco. Social: Ms. Mauriello  reports that she  quit smoking about 22 years ago. Her smoking use included cigarettes. She has never been exposed to tobacco smoke. She has never used smokeless tobacco. She reports current alcohol use. She reports that she does not use drugs. Medical:  has a past medical history of Anxiety, Arthritis, Depression, Hypertension, Rosacea, and Thyroid disease. Surgical: Ms. Storck  has a past surgical history that includes Shoulder surgery (Right, 2016) and Shoulder surgery (Right, 2017). Family: She was adopted. Family history is unknown by patient.  Laboratory Chemistry Profile   Renal No results found for: BUN, CREATININE, LABCREA, BCR, GFR, GFRAA, GFRNONAA, LABVMA, EPIRU, EPINEPH24HUR, NOREPRU, NOREPI24HUR, DOPARU, [id number]  Hepatic No results found for: AST, ALT, ALBUMIN, ALKPHOS, HCVAB, AMYLASE, LIPASE, AMMONIA  Electrolytes No results found for: NA, K, CL, CALCIUM, MG, PHOS  Bone No results found for: VD25OH, [id number], CI6874NY7, CI7874NY7, 25OHVITD1, 25OHVITD2, 25OHVITD3, TESTOFREE, TESTOSTERONE  Inflammation (CRP: Acute Phase) (ESR: Chronic Phase) No results found for: CRP, ESRSEDRATE, LATICACIDVEN       Note: Above Lab results reviewed.  Recent Imaging Review  DG PAIN CLINIC C-ARM 1-60 MIN NO REPORT Fluoro was used, but no Radiologist interpretation will be provided.  Please  refer to NOTES tab for provider progress note. Note: Reviewed        Physical Exam  Vitals: BP 128/80 (BP Location: Right Arm, Patient Position: Sitting)   Pulse 70   Temp (!) 97.1 F (36.2 C) (Temporal)   Resp 18   Ht 5' 7 (1.702 m)   Wt 220 lb (99.8 kg)   SpO2 100%   BMI 34.46 kg/m  BMI: Estimated body mass index is 34.46 kg/m as calculated from the following:   Height as of this encounter: 5' 7 (1.702 m).   Weight as of this encounter: 220 lb (99.8 kg). Ideal: Ideal body weight: 61.6 kg (135 lb 12.9 oz) Adjusted ideal  body weight: 76.9 kg (169 lb 7.7 oz) General appearance: Well nourished, well developed, and well hydrated. In no apparent acute distress Mental status: Alert, oriented x 3 (person, place, & time)       Respiratory: No evidence of acute respiratory distress Eyes: PERLA   Assessment   Diagnosis Status  1. Chronic pain syndrome   2. SI joint arthritis (HCC)   3. Sacroiliac joint pain   4. Medication management   5. Chronic pain of right knee   6. Myofascial pain syndrome, cervical   7. Piriformis syndrome of both sides    Controlled Controlled Controlled   Updated Problems: No problems updated.  Plan of Care  Problem-specific:  Assessment and Plan  The patient will continue on current medication regimen.  Prescribing drug monitoring (PDMP) reviewed; findings consistent with the use of prescribed medication and no evidence of narcotic misuse or abuse.  Routine UDS ordered today (Serum).  No side effects or any adverse reaction reported to this medication.  Schedule follow-up in 90 days for medication management.  No other new issues or problems reported at this visit.   Ms. AISHI COURTS has a current medication list which includes the following long-term medication(s): azelastine, escitalopram, fluticasone, furosemide, levothyroxine, myrbetriq, omeprazole, potassium chloride sa, pregabalin , ropinirole, simvastatin, triamterene-hydrochlorothiazide, ventolin hfa, and zolpidem.  Pharmacotherapy (Medications Ordered): Meds ordered this encounter  Medications   HYDROcodone -acetaminophen  (NORCO) 7.5-325 MG tablet    Sig: Take 1 tablet by mouth every 8 (eight) hours as needed.    Dispense:  90 tablet    Refill:  0   HYDROcodone -acetaminophen  (NORCO) 7.5-325 MG tablet    Sig: Take 1 tablet by mouth every 8 (eight) hours as needed.    Dispense:  90 tablet    Refill:  0   HYDROcodone -acetaminophen  (NORCO) 7.5-325 MG tablet    Sig: Take 1 tablet by mouth every 8 (eight) hours as  needed.    Dispense:  90 tablet    Refill:  0   Orders:  Orders Placed This Encounter  Procedures   Drug Screen 10 W/Conf, Serum    Release to patient:   Immediate      Return in about 3 months (around 12/21/2024) for (F2F), (MM), Emmy Blanch NP.    Recent Visits Date Type Provider Dept  09/07/24 Procedure visit Marcelino Nurse, MD [facility]  08/11/24 Office Visit Marcelino Nurse, MD [facility]  06/30/24 Office Visit Eugena Rhue K, NP [facility]  Showing recent visits within past 90 days and meeting all other requirements Today's Visits Date Type Provider Dept  09/21/24 Office Visit Phil Corti K, NP [facility]  Showing today's visits and meeting all other requirements Future Appointments No visits were found meeting these conditions. Showing future appointments within next 90  days and meeting all other requirements  I discussed the assessment and treatment plan with the patient. The patient was provided an opportunity to ask questions and all were answered. The patient agreed with the plan and demonstrated an understanding of the instructions.  Patient advised to call back or seek an in-person evaluation if the symptoms or condition worsens.  I personally spent a total of 30 minutes in the care of the patient today including preparing to see the patient, getting/reviewing separately obtained history, performing a medically appropriate exam/evaluation, counseling and educating, placing orders, documenting clinical information in the EHR, independently interpreting results, communicating results, and coordinating care.   Duration of encounter:  minutes.  Total time on encounter, as per AMA guidelines included both the face-to-face and non-face-to-face time personally spent by the physician and/or other qualified health care professional(s) on the day of the encounter (includes time in activities that require the physician or other qualified  health care professional and does not include time in activities normally performed by clinical staff). Physician's time may include the following activities when performed: Preparing to see the patient (e.g., pre-charting review of records, searching for previously ordered imaging, lab work, and nerve conduction tests) Review of prior analgesic pharmacotherapies. Reviewing PMP Interpreting ordered tests (e.g., lab work, imaging, nerve conduction tests) Performing post-procedure evaluations, including interpretation of diagnostic procedures Obtaining and/or reviewing separately obtained history Performing a medically appropriate examination and/or evaluation Counseling and educating the patient/family/caregiver Ordering medications, tests, or procedures Referring and communicating with other health care professionals (when not separately reported) Documenting clinical information in the electronic or other health record Independently interpreting results (not separately reported) and communicating results to the patient/ family/caregiver Care coordination (not separately reported)  Note by: Kupono Marling K Yoltzin Barg, NP (TTS and AI technology used. I apologize for any typographical errors that were not detected and corrected.) Date: 09/21/2024; Time: 2:04 PM

## 2024-10-08 NOTE — Progress Notes (Signed)
 Ref Provider: Dr. Sherial Bail, MD PCP: Dr. Sherial Bail, MD Assessment and Plan:   In most patients we give written parts of assessment and plan to patient under Patient Instructions/After Visit Summary. So some parts are directed to patient.  Dear [name], It was our pleasure to participate in your care. We have typed up brief summary of what we discussed. 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 Condition exacerbated by smoking, contributing to cerebral vascular issues. Progressive decline raises safety concerns for living alone and driving. Imaging from May 2024 showed extensive small vessel hardening and brain atrophy. Further imaging not expected to alter management.  I reviewed patient's imaging report and actual images, on my read I agree with the radiologist report.  EXAM:  MRI HEAD WITHOUT CONTRAST   TECHNIQUE:  Multiplanar, multiecho pulse sequences of the brain and surrounding  structures were obtained without intravenous contrast.   COMPARISON:  Cervical spine MRI 05/28/2023.   FINDINGS:  Brain: Cerebral volume is within normal limits for age. No  restricted diffusion to suggest acute infarction. No midline shift,  mass effect, evidence of mass lesion, ventriculomegaly, extra-axial  collection or acute intracranial hemorrhage. Cervicomedullary  junction and pituitary are within normal limits.   Scattered mild or at most moderate for age small cerebral white  matter T2 and FLAIR hyperintensity in a nonspecific configuration.  Frontal lobes are most affected, more so the left. No cortical  encephalomalacia or chronic cerebral blood products. Deep gray   nuclei are within normal limits. However, there is mild patchy T2  and FLAIR heterogeneity throughout the pons. And there are several  small chronic lacunar type infarcts in the bilateral cerebellum  (series 11, images 8 and 9).   Vascular: Major intracranial vascular flow voids are preserved. The  distal right vertebral artery appears somewhat dominant.   Skull and upper cervical spine: Negative for age visible cervical  spine. Visualized bone marrow signal is within normal limits.   Sinuses/Orbits: Negative.   Other: Mild right mastoid air cell effusion, probably  postinflammatory and significance doubtful. Left mastoids are clear.  Other Visible internal auditory structures appear normal. Normal  stylomastoid foramina. Negative visible scalp and face.   IMPRESSION:  No acute intracranial abnormality. But overall moderate for age  signal changes in the brain most compatible with chronic small  vessel disease, including pontine involvement and several small  chronic cerebellar infarcts.   - Advise against driving due to safety concerns.  No driving - Discuss need for assisted living or retirement community due to inability to live independently. - Provide printout of previous MRI report for records. - Consider retirement community  - Recommend beginning Alpha Lipoic Acid 600 mg a day.  Alpha-lipoic acid is a naturally occurring, biological antioxidant that might help with many chronic diseases, mostly diabetic peripheral neuropathy, but also can help other types of neuropathies as well. Usually given 600 mg/day, may take 3-5 wks before some symptomatic relief occurs. Diabetic patients may need to reduce their diabetic meds. It may exacerbate thiamine deficiency in patients with chronic malnutrition e.g. alcoholism.  - Headspace (paid app), Calm, and Healthyminds are apps that can help teach and guide meditation with short easy to use sessions. Meditation has shown benefits in  multiple neurological  conditions including stress, focus, attention, insomnia, etc. For more information visit www.headspace.com, www.calm.com, or www.hminnovations.org/meditation-app  Stress can make many neurological symptoms worse. Stress relaxation techniques are simple, inexpensive and effective way of dealing with stress. Deep breathing, progressive muscle relaxation technique, visualization, massage, meditation, yoga and regular exercise can be helpful as well.  Whole food, plant based diet, with minimal amount of processed food can be very healthy. Www.nutritionfacts.org   Ultra-processed foods (UPFs) are food like edible substances made in a factory that undergo extensive processing and typically contain multiple ingredients, including additives, preservatives, and artificial flavors. Some commonly recognized examples are soda, chips, cooking, fast food, etc. So called healthy food can be ultra processed as well, e.g. protein bars, protein shakes, instant breakfast cereals, frozen meals, low fat yogurt, etc. Common characteristics of ultra processed food: High in sugar, salt, and unhealthy fats. Low in fiber, vitamins, and minerals. Contain additives and preservatives. Designed to be hyper-palatable and addictive. Easy way to recognize ultra processed food - if you can't easily pronounce ingredients, if you can't make a particular food in your kitchen, if a food has a long shelf life, etc. You can also check out videos by Dr. Medford Fleeta Plaza on this subject.   Dr. Maree has created some playlists on his YouTube channel that may benefit you. Consider going to: [email or web address]  Other ways to search: go to [email or web address]. Once there, find the search bar and type in [email or web address]. Select Dr. Jamal channel. Here you can click on the Playlists Tab and find the playlist that have been recommended to you.   2. History of Left cerebellar strokes, brainstem gliosis  (likely ischemic), white matter microvascular ischemic and metabolic changes  Left cerebellar strokes and brainstem gliosis, likely ischemic, contributing to current cognitive and balance issues.  3. Frequent falls in a patient with osteoporosis and sudden onset of imbalance in the summer of 2019 with reported evidence of mild cerebral volume loss, chronic lacunar infarcts in the left cerebellum, and mild chronic microvascular ischemic changes on MRI in May 2022. No signs and symptoms suggestive of parkinsonism, Normal Pressure Hydrocephalus, myelopathy. Suspect sensory ataxia secondary to peripheral sensorimotor neuropathy  Imbalance and frequent falls likely related to neurological issues and exacerbated by osteoporosis. Fear of falling has prevented her from performing balance exercises, contributing to instability.  - Order home physical therapy with a focus on balance exercises.  Order home Physical Therapy, Occupational Therapy , Speech therapy for neuropathy, balance, and fall prevention  Balance is a very complex, learned motor task that requires intact sensory input (vision, vestibular function, proprioception (joint position sense), touch and pressure sensation), integration of sensory input (cerebellar and subcortical cerebral pathways), and good motor output (muscle strength, well-aligned skeletal system). Major abnormality of any one system OR minor dysfunction of multiple systems combined can cause difficulty with balance and falls. The patient was informed about fall prevention and safety. To learn more about specific exercises/techniques to improve your balance, visit my YouTube Channel at [email or web address]. Once there, go to the playlists tab and click on my Balance Exercise - [facility] Playlist.  You can also access this playlist by searching this URL: [email or web address]  There are a few interventions  the patient should consider to prevent falls: Be careful, Avoid high heel footwear, Use appropriate assistive device, Avoid hazards in your path,  (toys, pets, wires, etc.), Have good light when you walk, Use a nonskid shower mat/shower chair, Use handrails and grab bars. Check out www.stopfalls.org for more  information.  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 Lack of balance exercises may exacerbate symptoms.  - Include balance exercises in home physical therapy regimen.  5. Peripheral neuropathy, sensory-motor type, and bilateral carpal tunnel syndrome Peripheral neuropathy and bilateral carpal tunnel syndrome with worsening symptoms despite previous injections. Significant muscle mass loss in hands and difficulty with dexterity. Carpal tunnel syndrome confirmed, considering surgical intervention under local anesthesia.  - Refer to orthopedics for evaluation of carpal tunnel surgery under local anesthesia.  Order orthopedic consult for carpel tunnel surgery  - Recommend use of carpal tunnel wrist splints, available over the counter. - Set up home health services including occupational therapy, speech therapy, and nursing.  Follow-up 6 months with Allyson Stallion, FNP-BC Return in about 6 months (around 04/08/2025) for with Allyson Stallion, FNP-BC.  This note has been created using automated tools and reviewed for accuracy by [facility]. Interim History date 10/08/2024   Sandy Byrd is a 69 y.o. female here for treatment and evaluation of Carpal Tunnel  Sandy Byrd last visit was on 06/10/2024  Patient states her carpal tunnel syndrome symptoms are worse.  She received injections on 06/25/2024 and 07/02/2024.    She feels her balance is worse.  She has completed Physical Therapy.    History of Present Illness Sandy Byrd is a 69 year old female with mild cognitive impairment and left cerebellar strokes who presents with  frequent falls.  Gait instability and falls - Frequent falls and significant imbalance since September 2019 - Progressive worsening of balance issues over time - Fear of falling has limited participation in physical therapy to stretching exercises only, avoiding balance exercises - Lives alone, raising safety concerns due to frequent falls - Friend checks on her when she does not respond for a day, fearing she may have fallen  Cognitive impairment - Mild cognitive impairment with deterioration in memory - Episodes of confusion, such as inability to operate car windows or put the car in reverse despite prior familiarity - Memory described as 'just gone' by her friend - Concerns regarding ability to live alone and drive safely due to cognitive decline  Chronic neurological findings - History of left cerebellar strokes and brain stem gliosis - Brain MRI from May 2022 showed mild cerebral volume loss, chronic pulmonary infarction, and chronic macroscopic ischemic changes  Peripheral neuropathy and carpal tunnel syndrome - History of sensory motor peripheral neuropathy and bilateral carpal tunnel syndrome - Received carpal tunnel injections in June 2025 with worsening symptoms since then  Benign paroxysmal positional vertigo (bppv) - History of residual canal BPPV  Medication use and symptom management - Currently taking Lyrica  100 mg three times a day and Norco three times a day - No symptom relief with current medications - Inquiring about increasing the dose of Lyrica   Tobacco use - History of smoking, which may contribute to her health issues  I reviewed labs, imaging, and notes in [facility], [facility], and from outside providers, if available.    Results RADIOLOGY Brain MRI (05/2023): Extensive hardening of small blood vessels, brain shrinkage, cerebellar strokes. Brain MRI (04/2021): Mild cerebral volume loss, chronic ischemic changes.  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 No signs and symptoms suggestive of parkinsonism, Normal Pressure Hydrocephalus, myelopathy, or sensory neuropathy as cause of her imbalance.  Her imbalance has been gradually worsening. She uses a wide-based cane and Rollator for stability but has daily falls. She has mild non-fluctuating ptosis of the right eye compared to the left. Patient denies dizziness, weakness, paresthesia, or stroke symptoms. She denies loss of bowel or bladder control (aside from stress incontinence). She is unsure of her family history because she is adopted  Patient reports that she is here to discuss about her brian MRI results, and balance issues. She reports that she is for imbalance. Her symptoms started suddenly on 2019, and since then it has been getting worse. Walking makes her symptoms worse. She had a brain MRI done in May 2022. She does not have any family history of similar conditions as she was adopted. She reports that she has major life event lorette situation as she is having alot of stress.  She is more worried about her imbalance.   She denies taking any new medications, lab work, trying any new medication for her issue, having similar symptoms prior.   MRI Brain without contrast 05/30/2023 - No acute intracranial abnormality. But overall moderate for age signal changes in the brain most compatible with chronic small vessel disease, including pontine involvement and several small chronic cerebellar infarcts.    MRI Cervical Spine (C-Spine) without contrast 05/28/2023 - C5-6 left foraminal impingement from  uncovertebral spur. Widely patent spinal canal.  Normal appearance of the cord.       MR Lumbar spine wo contrast 03/05/2023 - No evidence of high-grade spinal canal or neural foraminal stenosis. Moderate narrowing of the right lateral recess at L4-L5, which could impinge the traversing right L5 nerve root. Small intraspinal synovial cyst on the left at L3-L4 which abuts the descending nerve roots along the posterior margin.    Lower Extremity NCS/EMG 05/29/2023 - Generalized sensory motor peripheral  neuropathy, no electrodiagnostic evidence of superimposed lumbosacral radiculopathy.This study may over estimate the degree of peripheral neuropathy due to edema and adipose tissue.   Upper Extremity NCS/EMG 05/15/2023 - This was an abnormal nerve conduction study showing bilateral grade I carpel tunnel syndrome.   MRI Brain with and without Contrast 05/03/2021: 1.  No acute abnormality. 2.  Mild cerebral volume loss. 3.  Brainstem gliosis. 4.  Chronic lacunar infarcts in the left cerebellum. 5.  Mild chronic microvascular ischemic disease.  MRI Lumbar Spine without Contrast 05/03/2021: Mild degenerative disease in the lumbar spine as described above.  History of L2-L4 buldging disc and chronic back pain - seeing pain management and receiving ablations with relief MRI Lumbar Spine without Contrast 05/03/2021: Mild degenerative disease in the lumbar spine as described above.  Insomnia, on trazodone  Osteoporosis. Taking vitamin D  History of vitamin B12 deficiency   History of Sjogren's syndrome - following rheumatology.   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 Had VNG on 06/10/2023  Sensorimotor Neuropathy- Pregabalin    Bilateral Carpal Tunnel Syndrome- NCS in patient grade I  Cognitive impairment with progressive memory loss and chronic  cerebral small vessel disease with cerebral atrophy Cognitive impairment with progressive memory loss, likely due to chronic cerebral small vessel disease and cerebral  atrophy, with symptoms including memory difficulty and challenges in performing routine tasks. Condition exacerbated by smoking, contributing to cerebral vascular issues. Progressive decline raises safety concerns for living alone and driving. Imaging from May 2024 showed extensive small vessel hardening and brain atrophy. Further imaging not expected to alter management.  EXAM:  MRI HEAD WITHOUT CONTRAST   TECHNIQUE:  Multiplanar, multiecho pulse sequences of the brain and surrounding  structures were obtained without intravenous contrast.   COMPARISON:  Cervical spine MRI 05/28/2023.   FINDINGS:  Brain: Cerebral volume is within normal limits for age. No  restricted diffusion to suggest acute infarction. No midline shift,  mass effect, evidence of mass lesion, ventriculomegaly, extra-axial  collection or acute intracranial hemorrhage. Cervicomedullary  junction and pituitary are within normal limits.   Scattered mild or at most moderate for age small cerebral white  matter T2 and FLAIR hyperintensity in a nonspecific configuration.  Frontal lobes are most affected, more so the left. No cortical  encephalomalacia or chronic cerebral blood products. Deep gray  nuclei are within normal limits. However, there is mild patchy T2  and FLAIR heterogeneity throughout the pons. And there are several  small chronic lacunar type infarcts in the bilateral cerebellum  (series 11, images 8 and 9).   Vascular: Major intracranial vascular flow voids are preserved. The  distal right vertebral artery appears somewhat dominant.   Skull and upper cervical spine: Negative for age visible cervical  spine. Visualized bone marrow signal is within normal limits.   Sinuses/Orbits: Negative.   Other: Mild right mastoid air cell effusion,  probably  postinflammatory and significance doubtful. Left mastoids are clear.  Other Visible internal auditory structures appear normal. Normal  stylomastoid foramina. Negative visible scalp and face.   IMPRESSION:  No acute intracranial abnormality. But overall moderate for age  signal changes in the brain most compatible with chronic small  vessel disease, including pontine involvement and several small  chronic cerebellar infarcts.   Physical Exam   Vitals Vitals:   10/08/24 0959  BP: 119/77  Pulse: 65  Weight: 95.7 kg (211 lb)  Height: 170.2 cm (5' 7)  PainSc:   8   Body mass index is 33.05 kg/m.  (Some of the exam changes noted are from previous clinical observations)  Physical Exam   General Exam  Neurological Exam Mild ptosis on the right side compared to the left Gait and station are normal when examined in small exam room Romberg's test is negative  Medications: Current Outpatient Medications on File Prior to Visit  Medication Sig Dispense Refill  . acetaminophen  (TYLENOL ) 500 MG tablet Take 1,000 mg by mouth every 8 (eight) hours    . ALPRAZolam (XANAX) 0.5 MG tablet Take 0.5 mg by mouth once daily    . ascorbic acid, vitamin C, (VITAMIN C) 500 MG tablet Take 500 mg by mouth once daily    . azelastine (ASTELIN) 137 mcg nasal spray USE 1 TO 2 SPRAY(S) IN EACH NOSTRIL TWICE DAILY    . baclofen (LIORESAL) 10 MG tablet TAKE 1 TABLET BY MOUTH TWICE DAILY AS NEEDED FOR MUSCLE SPASM 60 tablet 0  . celecoxib (CELEBREX) 200 MG capsule Take 1 capsule by mouth twice daily 180 capsule 0  . cyanocobalamin (VITAMIN B12) 1000 MCG tablet Take 2,000 mcg by mouth once daily    . ergocalciferol, vitamin D2, 1,250 mcg (50,000 unit) capsule Take 1 capsule by mouth once a week 12 capsule 0  . escitalopram oxalate (LEXAPRO) 20 MG tablet  Take 1 tablet (20 mg total) by mouth once daily 90 tablet 1  . fluticasone propionate (FLONASE) 50 mcg/actuation nasal spray Place into one  nostril 2 (two) times daily    . folic acid (FOLVITE) 1 MG tablet Take 1 mg by mouth once daily    . FUROsemide (LASIX) 40 MG tablet Take 1 tablet by mouth once daily 90 tablet 0  . HYDROcodone -acetaminophen  (NORCO) 7.5-325 mg tablet Take 1 tablet by mouth 3 (three) times a day    . ketoconazole (NIZORAL) 2 % shampoo     . KLOR-CON M20 20 mEq ER tablet Take 1 tablet by mouth once daily 30 tablet 0  . metroNIDAZOLE (METROGEL) 1 % gel 2 (two) times daily    . mirabegron (MYRBETRIQ) 50 mg ER tablet Take 1 tablet (50 mg total) by mouth once daily 30 tablet 3  . pilocarpine (SALAGEN) 5 mg tablet Take 1 tablet (5 mg total) by mouth 3 (three) times daily as needed 90 tablet 5  . pregabalin  (LYRICA ) 100 MG capsule Take 100 mg by mouth 3 (three) times daily    . RESTASIS 0.05 % ophthalmic emulsion INSTILL 1 DROP INTO EACH EYE TWICE DAILY 180 each 1  . rOPINIRole (REQUIP) 1 MG immediate release tablet Take 1 tablet (1 mg total) by mouth at bedtime 90 tablet 1  . simvastatin (ZOCOR) 40 MG tablet Take 40 mg by mouth at bedtime Take 1 tablet (40 mg total) by mouth at bedtime    . triamcinolone 0.1 % cream Apply topically 2 (two) times daily    . triamterene-hydroCHLOROthiazide (DYAZIDE) 37.5-25 mg capsule Take 1 capsule by mouth once daily 90 capsule 0  . VENTOLIN HFA 90 mcg/actuation inhaler INHALE 2 PUFFS BY MOUTH EVERY 4 HOURS AS NEEDED FOR WHEEZING 18 g 0  . levothyroxine (SYNTHROID) 100 MCG tablet Take 1 tablet (100 mcg total) by mouth once daily Take on an empty stomach with a glass of water at least 30-60 minutes before breakfast. 90 tablet 1   No current facility-administered medications on file prior to visit.   Past Medical History:  Past Medical History:  Diagnosis Date  . Anxiety   . Arthritis   . Depression   . History of stroke   . Hypertension   . Osteoporosis   . Rosacea   . Thyroid disease     Past Surgical History:  Past Surgical History:  Procedure Laterality Date  .  ARTHROSCOPIC ROTATOR CUFF REPAIR Left 08/2015  . ARTHROSCOPIC ROTATOR CUFF REPAIR Right 06/2016  . RADIOFREQUENCY ABLATION SPINAL NERVES  04/2021  . CHOLECYSTECTOMY     Family History:  Family History  Adopted: Yes  Family history unknown: Yes   Social History:  Social History   Socioeconomic History  . Marital status: Single  . Number of children: 0  Occupational History  . Occupation: retired    Comment: retired engineer, civil (consulting)  Tobacco Use  . Smoking status: Never    Passive exposure: Never  . Smokeless tobacco: Never  Vaping Use  . Vaping status: Never Used  Substance and Sexual Activity  . Alcohol use: Yes    Alcohol/week: 1.0 - 2.0 standard drink of alcohol    Types: 1 - 2 Glasses of wine per week  . Drug use: Never  . Sexual activity: Defer   Social Drivers of Health   Financial Resource Strain: Low Risk  (07/16/2024)   Overall Financial Resource Strain (CARDIA)   . Difficulty of Paying Living Expenses: Not hard  at all  Food Insecurity: No Food Insecurity (07/16/2024)   Hunger Vital Sign   . Worried About Programme Researcher, Broadcasting/film/video in the Last Year: Never true   . Ran Out of Food in the Last Year: Never true  Recent Concern: Food Insecurity - Food Insecurity Present (06/16/2024)   Hunger Vital Sign   . Worried About Programme Researcher, Broadcasting/film/video in the Last Year: Sometimes true   . Ran Out of Food in the Last Year: Never true  Transportation Needs: No Transportation Needs (07/16/2024)   PRAPARE - Transportation   . Lack of Transportation (Medical): No   . Lack of Transportation (Non-Medical): No  Housing Stability: Low Risk  (07/16/2024)   Housing Stability Vital Sign   . Unable to Pay for Housing in the Last Year: No   . Number of Times Moved in the Last Year: 0   . Homeless in the Last Year: No   Allergies:  Allergies  Allergen Reactions  . Cephalosporins Hives and Swelling  . Keflex [Cephalexin] Hives and Swelling  . Prochlorperazine Hives and Swelling  . Shellfish Containing  Products Anaphylaxis  . Shrimp Anaphylaxis  . Sulfa (Sulfonamide Antibiotics) Hives and Swelling  . Adhesive Tape-Silicones Rash    Plastic Tape (ok to use paper tape)  . Honey Hives    Welts  . Latex, Natural Rubber Other (See Comments)    Skin breaks out really bad   . Other Other (See Comments)    Cannot do plastic tape  . Silicones Rash    silicones  . Venom-Honey Bee Hives    Wasps sting   Dr. Jannett Fairly

## 2024-11-02 ENCOUNTER — Emergency Department

## 2024-11-02 ENCOUNTER — Observation Stay: Admission: EM | Admit: 2024-11-02 | Discharge: 2024-11-06 | Disposition: A | Discharge status: Skilled Nursing Facility

## 2024-11-02 ENCOUNTER — Other Ambulatory Visit: Payer: Self-pay

## 2024-11-02 DIAGNOSIS — E039 Hypothyroidism, unspecified: Secondary | ICD-10-CM | POA: Insufficient documentation

## 2024-11-02 DIAGNOSIS — E538 Deficiency of other specified B group vitamins: Secondary | ICD-10-CM | POA: Insufficient documentation

## 2024-11-02 DIAGNOSIS — D529 Folate deficiency anemia, unspecified: Secondary | ICD-10-CM | POA: Diagnosis not present

## 2024-11-02 DIAGNOSIS — I10 Essential (primary) hypertension: Secondary | ICD-10-CM | POA: Insufficient documentation

## 2024-11-02 DIAGNOSIS — F0390 Unspecified dementia without behavioral disturbance: Secondary | ICD-10-CM | POA: Insufficient documentation

## 2024-11-02 DIAGNOSIS — R197 Diarrhea, unspecified: Secondary | ICD-10-CM | POA: Insufficient documentation

## 2024-11-02 DIAGNOSIS — G609 Hereditary and idiopathic neuropathy, unspecified: Secondary | ICD-10-CM | POA: Insufficient documentation

## 2024-11-02 DIAGNOSIS — Z91040 Latex allergy status: Secondary | ICD-10-CM | POA: Insufficient documentation

## 2024-11-02 DIAGNOSIS — E876 Hypokalemia: Secondary | ICD-10-CM | POA: Diagnosis not present

## 2024-11-02 DIAGNOSIS — Z23 Encounter for immunization: Secondary | ICD-10-CM | POA: Diagnosis not present

## 2024-11-02 DIAGNOSIS — K529 Noninfective gastroenteritis and colitis, unspecified: Secondary | ICD-10-CM | POA: Diagnosis not present

## 2024-11-02 DIAGNOSIS — G894 Chronic pain syndrome: Secondary | ICD-10-CM | POA: Insufficient documentation

## 2024-11-02 DIAGNOSIS — R531 Weakness: Secondary | ICD-10-CM

## 2024-11-02 DIAGNOSIS — M51361 Other intervertebral disc degeneration, lumbar region with lower extremity pain only: Secondary | ICD-10-CM | POA: Diagnosis not present

## 2024-11-02 DIAGNOSIS — W19XXXA Unspecified fall, initial encounter: Principal | ICD-10-CM | POA: Insufficient documentation

## 2024-11-02 DIAGNOSIS — F1090 Alcohol use, unspecified, uncomplicated: Secondary | ICD-10-CM | POA: Diagnosis not present

## 2024-11-02 DIAGNOSIS — R001 Bradycardia, unspecified: Secondary | ICD-10-CM | POA: Insufficient documentation

## 2024-11-02 DIAGNOSIS — N179 Acute kidney failure, unspecified: Secondary | ICD-10-CM | POA: Diagnosis not present

## 2024-11-02 DIAGNOSIS — Z87891 Personal history of nicotine dependence: Secondary | ICD-10-CM | POA: Diagnosis not present

## 2024-11-02 LAB — COMPREHENSIVE METABOLIC PANEL WITH GFR
ALT: 19 U/L (ref 0–44)
AST: 43 U/L — ABNORMAL HIGH (ref 15–41)
Albumin: 4.6 g/dL (ref 3.5–5.0)
Alkaline Phosphatase: 50 U/L (ref 38–126)
Anion gap: 24 — ABNORMAL HIGH (ref 5–15)
BUN: 18 mg/dL (ref 8–23)
CO2: 30 mmol/L (ref 22–32)
Calcium: 9 mg/dL (ref 8.9–10.3)
Chloride: 84 mmol/L — ABNORMAL LOW (ref 98–111)
Creatinine, Ser: 1.12 mg/dL — ABNORMAL HIGH (ref 0.44–1.00)
GFR, Estimated: 54 mL/min — ABNORMAL LOW (ref >60)
Glucose, Bld: 109 mg/dL — ABNORMAL HIGH (ref 70–99)
Potassium: 2.6 mmol/L — CL (ref 3.5–5.1)
Sodium: 138 mmol/L (ref 135–145)
Total Bilirubin: 4.2 mg/dL — ABNORMAL HIGH (ref 0.0–1.2)
Total Protein: 8.8 g/dL — ABNORMAL HIGH (ref 6.5–8.1)

## 2024-11-02 LAB — PROTIME-INR
INR: 1 (ref 0.8–1.2)
Prothrombin Time: 14.1 s (ref 11.4–15.2)

## 2024-11-02 LAB — URINALYSIS, COMPLETE (UACMP) WITH MICROSCOPIC
Bilirubin Urine: NEGATIVE
Glucose, UA: NEGATIVE mg/dL
Ketones, ur: 20 mg/dL — AB
Leukocytes,Ua: NEGATIVE
Nitrite: NEGATIVE
Protein, ur: 30 mg/dL — AB
Specific Gravity, Urine: 1.026 (ref 1.005–1.030)
pH: 5 (ref 5.0–8.0)

## 2024-11-02 LAB — CBC WITH DIFFERENTIAL/PLATELET
Abs Immature Granulocytes: 0.04 K/uL (ref 0.00–0.07)
Basophils Absolute: 0 K/uL (ref 0.0–0.1)
Basophils Relative: 1 %
Eosinophils Absolute: 0 K/uL (ref 0.0–0.5)
Eosinophils Relative: 1 %
HCT: 40 % (ref 36.0–46.0)
Hemoglobin: 13.8 g/dL (ref 12.0–15.0)
Immature Granulocytes: 1 %
Lymphocytes Relative: 16 %
Lymphs Abs: 1.3 K/uL (ref 0.7–4.0)
MCH: 34.5 pg — ABNORMAL HIGH (ref 26.0–34.0)
MCHC: 34.5 g/dL (ref 30.0–36.0)
MCV: 100 fL (ref 80.0–100.0)
Monocytes Absolute: 0.6 K/uL (ref 0.1–1.0)
Monocytes Relative: 7 %
Neutro Abs: 5.9 K/uL (ref 1.7–7.7)
Neutrophils Relative %: 74 %
Platelets: 170 K/uL (ref 150–400)
RBC: 4 MIL/uL (ref 3.87–5.11)
RDW: 14.6 % (ref 11.5–15.5)
WBC: 7.9 K/uL (ref 4.0–10.5)
nRBC: 0.3 % — ABNORMAL HIGH (ref 0.0–0.2)

## 2024-11-02 LAB — HIV ANTIBODY (ROUTINE TESTING W REFLEX): HIV Screen 4th Generation wRfx: NONREACTIVE

## 2024-11-02 LAB — HEPATITIS PANEL, ACUTE
HCV Ab: NONREACTIVE
Hep A IgM: NONREACTIVE
Hep B C IgM: NONREACTIVE
Hepatitis B Surface Ag: NONREACTIVE

## 2024-11-02 LAB — MAGNESIUM: Magnesium: 3.2 mg/dL — ABNORMAL HIGH (ref 1.7–2.4)

## 2024-11-02 LAB — POTASSIUM
Potassium: 2.4 mmol/L — CL (ref 3.5–5.1)
Potassium: 3.2 mmol/L — ABNORMAL LOW (ref 3.5–5.1)

## 2024-11-02 LAB — PHOSPHORUS: Phosphorus: 2.2 mg/dL — ABNORMAL LOW (ref 2.5–4.6)

## 2024-11-02 LAB — TROPONIN I (HIGH SENSITIVITY)
Troponin I (High Sensitivity): 12 ng/L (ref <18)
Troponin I (High Sensitivity): 11 ng/L (ref <18)

## 2024-11-02 LAB — TSH: TSH: 67.348 u[IU]/mL — ABNORMAL HIGH (ref 0.350–4.500)

## 2024-11-02 LAB — BILIRUBIN, DIRECT: Bilirubin, Direct: 1.3 mg/dL — ABNORMAL HIGH (ref 0.0–0.2)

## 2024-11-02 LAB — CK: Total CK: 468 U/L — ABNORMAL HIGH (ref 38–234)

## 2024-11-02 MED ORDER — HYDROCODONE-ACETAMINOPHEN 7.5-325 MG PO TABS
1.0000 | ORAL_TABLET | Freq: Three times a day (TID) | ORAL | Status: DC | PRN
  Administered 2024-11-02 – 2024-11-04 (×3): 1 via ORAL
  Filled 2024-11-02 (×3): qty 1

## 2024-11-02 MED ORDER — ENSURE PLUS HIGH PROTEIN PO LIQD
237.0000 mL | Freq: Two times a day (BID) | ORAL | Status: DC
  Administered 2024-11-02 – 2024-11-06 (×5): 237 mL via ORAL

## 2024-11-02 MED ORDER — PREGABALIN 50 MG PO CAPS
100.0000 mg | ORAL_CAPSULE | Freq: Three times a day (TID) | ORAL | Status: DC
Start: 2024-11-02 — End: 2024-11-06
  Administered 2024-11-02 – 2024-11-06 (×12): 100 mg via ORAL
  Filled 2024-11-02 (×12): qty 2

## 2024-11-02 MED ORDER — POTASSIUM CHLORIDE 10 MEQ/100ML IV SOLN
10.0000 meq | INTRAVENOUS | Status: AC
  Administered 2024-11-02 (×2): 10 meq via INTRAVENOUS
  Filled 2024-11-02 (×2): qty 100

## 2024-11-02 MED ORDER — SODIUM CHLORIDE 0.9 % IV BOLUS
500.0000 mL | Freq: Once | INTRAVENOUS | Status: AC
  Administered 2024-11-02: 500 mL via INTRAVENOUS

## 2024-11-02 MED ORDER — MIRABEGRON ER 50 MG PO TB24
50.0000 mg | ORAL_TABLET | Freq: Every day | ORAL | Status: DC
  Administered 2024-11-02 – 2024-11-06 (×4): 50 mg via ORAL
  Filled 2024-11-02 (×5): qty 1

## 2024-11-02 MED ORDER — CYCLOSPORINE 0.05 % OP EMUL
1.0000 [drp] | Freq: Two times a day (BID) | OPHTHALMIC | Status: DC
  Administered 2024-11-02 – 2024-11-06 (×9): 1 [drp] via OPHTHALMIC
  Filled 2024-11-02 (×10): qty 30

## 2024-11-02 MED ORDER — ENOXAPARIN SODIUM 40 MG/0.4ML IJ SOSY
40.0000 mg | PREFILLED_SYRINGE | INTRAMUSCULAR | Status: DC
  Administered 2024-11-02 – 2024-11-05 (×4): 40 mg via SUBCUTANEOUS
  Filled 2024-11-02 (×4): qty 0.4

## 2024-11-02 MED ORDER — BACLOFEN 10 MG PO TABS
10.0000 mg | ORAL_TABLET | Freq: Two times a day (BID) | ORAL | Status: DC
  Administered 2024-11-02 – 2024-11-06 (×9): 10 mg via ORAL
  Filled 2024-11-02 (×9): qty 1

## 2024-11-02 MED ORDER — POTASSIUM CHLORIDE CRYS ER 20 MEQ PO TBCR
40.0000 meq | EXTENDED_RELEASE_TABLET | ORAL | Status: AC
  Administered 2024-11-02 (×2): 40 meq via ORAL
  Filled 2024-11-02 (×2): qty 2

## 2024-11-02 MED ORDER — LEVOTHYROXINE SODIUM 100 MCG PO TABS
100.0000 ug | ORAL_TABLET | Freq: Every day | ORAL | Status: DC
  Administered 2024-11-03 – 2024-11-06 (×4): 100 ug via ORAL
  Filled 2024-11-02 (×4): qty 1

## 2024-11-02 MED ORDER — ONDANSETRON HCL 4 MG/2ML IJ SOLN: 4.0000 mg | Freq: Four times a day (QID) | INTRAMUSCULAR | Status: DC | PRN

## 2024-11-02 MED ORDER — POTASSIUM CHLORIDE 10 MEQ/100ML IV SOLN
10.0000 meq | Freq: Once | INTRAVENOUS | Status: AC
  Administered 2024-11-02: 10 meq via INTRAVENOUS
  Filled 2024-11-02: qty 100

## 2024-11-02 MED ORDER — HYDRALAZINE HCL 20 MG/ML IJ SOLN: 5.0000 mg | Freq: Four times a day (QID) | INTRAMUSCULAR | Status: DC | PRN

## 2024-11-02 MED ORDER — ACETAMINOPHEN 325 MG PO TABS: 650.0000 mg | ORAL_TABLET | Freq: Four times a day (QID) | ORAL | Status: DC | PRN

## 2024-11-02 MED ORDER — POTASSIUM CHLORIDE 20 MEQ PO PACK
40.0000 meq | PACK | Freq: Once | ORAL | Status: AC
  Administered 2024-11-02: 40 meq via ORAL
  Filled 2024-11-02: qty 2

## 2024-11-02 MED ORDER — ZOLPIDEM TARTRATE 5 MG PO TABS
5.0000 mg | ORAL_TABLET | Freq: Every evening | ORAL | Status: DC | PRN
  Filled 2024-11-02: qty 1

## 2024-11-02 MED ORDER — INFLUENZA VAC SPLIT HIGH-DOSE 0.5 ML IM SUSY
0.5000 mL | PREFILLED_SYRINGE | INTRAMUSCULAR | Status: AC
  Administered 2024-11-03: 0.5 mL via INTRAMUSCULAR
  Filled 2024-11-02: qty 0.5

## 2024-11-02 MED ORDER — AZELASTINE HCL 0.1 % NA SOLN
1.0000 | Freq: Two times a day (BID) | NASAL | Status: DC
  Administered 2024-11-02 – 2024-11-06 (×9): 1 via NASAL
  Filled 2024-11-02 (×2): qty 30

## 2024-11-02 MED ORDER — MAGNESIUM SULFATE 2 GM/50ML IV SOLN
2.0000 g | Freq: Once | INTRAVENOUS | Status: AC
  Administered 2024-11-02: 2 g via INTRAVENOUS
  Filled 2024-11-02: qty 50

## 2024-11-02 MED ORDER — PILOCARPINE HCL 5 MG PO TABS
5.0000 mg | ORAL_TABLET | Freq: Two times a day (BID) | ORAL | Status: DC
  Administered 2024-11-02 – 2024-11-06 (×9): 5 mg via ORAL
  Filled 2024-11-02 (×10): qty 1

## 2024-11-02 MED ORDER — ACETAMINOPHEN 650 MG RE SUPP: 650.0000 mg | Freq: Four times a day (QID) | RECTAL | Status: DC | PRN

## 2024-11-02 MED ORDER — K PHOS MONO-SOD PHOS DI & MONO 155-852-130 MG PO TABS
500.0000 mg | ORAL_TABLET | Freq: Two times a day (BID) | ORAL | Status: AC
  Administered 2024-11-02 (×2): 500 mg via ORAL
  Filled 2024-11-02 (×2): qty 2

## 2024-11-02 MED ORDER — ALBUTEROL SULFATE (2.5 MG/3ML) 0.083% IN NEBU: 2.5000 mg | INHALATION_SOLUTION | Freq: Four times a day (QID) | RESPIRATORY_TRACT | Status: DC | PRN

## 2024-11-02 MED ORDER — ESCITALOPRAM OXALATE 10 MG PO TABS
20.0000 mg | ORAL_TABLET | Freq: Every day | ORAL | Status: DC
  Administered 2024-11-02 – 2024-11-06 (×5): 20 mg via ORAL
  Filled 2024-11-02 (×5): qty 2

## 2024-11-02 MED ORDER — FLUTICASONE PROPIONATE 50 MCG/ACT NA SUSP
2.0000 | Freq: Two times a day (BID) | NASAL | Status: DC
  Administered 2024-11-02 – 2024-11-06 (×8): 2 via NASAL
  Filled 2024-11-02 (×2): qty 16

## 2024-11-02 MED ORDER — PANTOPRAZOLE SODIUM 40 MG PO TBEC
40.0000 mg | DELAYED_RELEASE_TABLET | Freq: Every day | ORAL | Status: DC
  Administered 2024-11-02 – 2024-11-06 (×5): 40 mg via ORAL
  Filled 2024-11-02 (×5): qty 1

## 2024-11-02 MED ORDER — ONDANSETRON HCL 4 MG PO TABS: 4.0000 mg | ORAL_TABLET | Freq: Four times a day (QID) | ORAL | Status: DC | PRN

## 2024-11-02 MED ORDER — ROPINIROLE HCL 1 MG PO TABS
1.0000 mg | ORAL_TABLET | Freq: Every day | ORAL | Status: DC
  Administered 2024-11-02 – 2024-11-05 (×4): 1 mg via ORAL
  Filled 2024-11-02 (×4): qty 1

## 2024-11-02 NOTE — H&P (Signed)
 History and Physical    Sandy Byrd FMW:[id number] DOB: December 27, 1955 DOA: 11/02/2024  PCP: Sherial Bail, MD (Confirm with patient/family/NH records and if not entered, this has to be entered at [facility] point of entry) Patient coming from: Home  I have personally briefly reviewed patient's old medical records in [facility]  Chief Complaint: Feeling weak and fell  HPI: Sandy Byrd is a 69 y.o. female with medical history significant of cognitive impairment, HTN HLD, chronic ambulation impairment on roller walker, anxiety/depression, hypothyroidism, presented with worsening generalized weakness, diarrhea and a fall at home.  Patient started to have watery diarrhea for the last 4 to 5 days, 3-5 times a day, denies any abdominal pain no nauseous vomiting, no fever or chills.  2 days ago she started to feel significant weakness, she uses roller walker to ambulate, Saturday morning she fell while standing up from bed.  She denied any LOC no head or neck injury.  She denies any one-sided numbness or weakness of annularly her limbs.  ED Course: Afebrile, nontachycardic blood pressure 117/82 O2 saturation 100% room air.  CT head and neck negative for fracture or fracture.  Blood work showed K2.6 hemoglobin 13 point WBC 7.9, UA negative for UTI, CK460.  Bilirubin 4.6.  Review of Systems: As per HPI otherwise 14 point review of systems negative.    Past Medical History:  Diagnosis Date   Anxiety    Arthritis    Depression    Hypertension    Rosacea    Thyroid disease     Past Surgical History:  Procedure Laterality Date   SHOULDER SURGERY Right 2016   SHOULDER SURGERY Right 2017     reports that she quit smoking about 22 years ago. Her smoking use included cigarettes. She has never been exposed to tobacco smoke. She has never used smokeless tobacco. She reports current alcohol use. She reports that she does not use drugs.  Allergies  Allergen Reactions   Cephalexin Hives  and Swelling   Cephalosporins Hives and Swelling   Prochlorperazine Hives and Swelling   Shrimp Extract Anaphylaxis   Bee Venom Hives    Wasps sting   Latex Other (See Comments)    Skin breaks out really bad   Other Other (See Comments)    Cannot do plastic tape    Family History  Adopted: Yes  Family history unknown: Yes     Prior to Admission medications   Medication Sig Start Date End Date Taking? Authorizing Provider  acetaminophen  (TYLENOL ) 500 MG tablet Take by mouth.    [provider]  ascorbic acid (VITAMIN C) 500 MG tablet Take by mouth.    [provider]  azelastine (ASTELIN) 0.1 % nasal spray USE 1 TO 2 SPRAY(S) IN EACH NOSTRIL TWICE DAILY 06/05/24   [provider]  baclofen (LIORESAL) 10 MG tablet Take 10 mg by mouth 2 (two) times daily.    [provider]  celecoxib (CELEBREX) 200 MG capsule Take 200 mg by mouth 2 (two) times daily. 03/18/23   [provider]  cyanocobalamin (VITAMIN B12) 1000 MCG tablet Take 1,000 mcg by mouth daily.    [provider]  cycloSPORINE (RESTASIS) 0.05 % ophthalmic emulsion Place 1 drop into both eyes 2 (two) times daily.    [provider]  EPINEPHrine 0.3 mg/0.3 mL IJ SOAJ injection SMARTSIG:1 Pre-Filled Pen Syringe IM PRN 11/15/21   [provider]  ergocalciferol (VITAMIN D2) 1.25 MG (50000 UT) capsule Take 50,000  Units by mouth once a week. 11/16/21   [provider]  escitalopram (LEXAPRO) 20 MG tablet Take by mouth. 05/09/21   [provider]  fluticasone (FLONASE) 50 MCG/ACT nasal spray Place 2 sprays into both nostrils 2 (two) times daily.    [provider]  folic acid (FOLVITE) 1 MG tablet Take by mouth.    [provider]  furosemide (LASIX) 40 MG tablet Take by mouth. 09/16/23 09/21/24  [provider]  HYDROcodone -acetaminophen  (NORCO) 7.5-325 MG tablet Take 1 tablet by mouth every 8 (eight) hours as needed.  10/15/24 11/14/24  Patel, Seema K, NP  HYDROcodone -acetaminophen  (NORCO) 7.5-325 MG tablet Take 1 tablet by mouth every 8 (eight) hours as needed. 11/14/24 12/14/24  Patel, Seema K, NP  HYDROcodone -acetaminophen  (NORCO) 7.5-325 MG tablet Take 1 tablet by mouth every 8 (eight) hours as needed. 12/14/24 01/13/25  Patel, Seema K, NP  ketoconazole (NIZORAL) 2 % shampoo Apply 1 Application topically as needed. 09/17/22   [provider]  levothyroxine (SYNTHROID) 100 MCG tablet Take 100 mcg by mouth daily. 11/14/21   [provider]  lidocaine  (LIDODERM ) 5 % Place 1 patch onto the skin every 12 (twelve) hours. Remove & Discard patch within 12 hours or as directed by MD 04/07/24 04/07/25  Marcelino Nurse, MD  Magnesium 500 MG CAPS Take 500 mg by mouth daily.    [provider]  metroNIDAZOLE (METROGEL) 1 % gel Apply topically daily. 01/24/22   [provider]  MYRBETRIQ 50 MG TB24 tablet Take 50 mg by mouth daily. 07/31/24   [provider]  omeprazole (PRILOSEC) 40 MG capsule Take 40 mg by mouth at bedtime. 03/20/24   [provider]  pilocarpine (SALAGEN) 5 MG tablet Take by mouth. 02/25/24 02/24/25  [provider]  potassium chloride SA (KLOR-CON M) 20 MEQ tablet Take by mouth. 09/16/23 09/21/24  [provider]  pregabalin  (LYRICA ) 100 MG capsule Take 1 capsule (100 mg total) by mouth 3 (three) times daily. 06/30/24   Patel, Seema K, NP  rOPINIRole (REQUIP) 1 MG tablet Take 1 mg by mouth at bedtime. 07/16/24   [provider]  simvastatin (ZOCOR) 40 MG tablet Take 1 tablet by mouth at bedtime. 11/14/21 09/21/24  [provider]  triamcinolone cream (KENALOG) 0.1 % Apply topically 2 (two) times daily as needed. 01/24/22   [provider]  triamterene-hydrochlorothiazide (DYAZIDE) 37.5-25 MG capsule Take 1 capsule by mouth daily. 11/14/21   [provider]  Turmeric 500 MG CAPS Take 500 mg by mouth daily.     [provider]  VENTOLIN HFA 108 (90 Base) MCG/ACT inhaler SMARTSIG:2 Puff(s) By Mouth Every 4 Hours PRN 11/15/21   [provider]  Vitamin D, Ergocalciferol, (DRISDOL) 1.25 MG (50000 UNIT) CAPS capsule Take 1 capsule by mouth once a week. 11/26/22   [provider]  zolpidem (AMBIEN) 10 MG tablet Take by mouth. 09/24/23 09/21/24  [provider]    Physical Exam: Vitals:   11/02/24 0932 11/02/24 1000 11/02/24 1030 11/02/24 1100  BP:  114/75 118/72 124/76  Pulse:  65 (!) 59 (!) 57  Resp:  14 14 14   Temp:      TempSrc:      SpO2:  98% 97% 99%  Weight: 89.4 kg     Height: 5' 7 (1.702 m)       Constitutional: NAD, calm, comfortable Vitals:   11/02/24 0932 11/02/24 1000 11/02/24 1030 11/02/24 1100  BP:  114/75 118/72 124/76  Pulse:  65 (!) 59 (!) 57  Resp:  14 14 14   Temp:      TempSrc:      SpO2:  98% 97% 99%  Weight: 89.4 kg     Height: 5' 7 (1.702 m)      Eyes: PERRL, lids and conjunctivae normal ENMT: Mucous membranes are moist. Posterior pharynx clear of any exudate or lesions.Normal dentition.  Neck: normal, supple, no masses, no thyromegaly Respiratory: clear to auscultation bilaterally, no wheezing, no crackles. Normal respiratory effort. No accessory muscle use.  Cardiovascular: Regular rate and rhythm, no murmurs / rubs / gallops. No extremity edema. 2+ pedal pulses. No carotid bruits.  Abdomen: no tenderness, no masses palpated. No hepatosplenomegaly. Bowel sounds positive.  Musculoskeletal: no clubbing / cyanosis. No joint deformity upper and lower extremities. Good ROM, no contractures. Normal muscle tone.  Skin: no rashes, lesions, ulcers. No induration Neurologic: CN 2-12 grossly intact. Sensation intact, DTR normal. Strength 5/5 in all 4.  Psychiatric: Normal judgment and insight. Alert and oriented x 3. Normal mood.    Labs on Admission: I have personally reviewed following labs and imaging studies  CBC: Recent Labs   Lab 11/02/24 0934  WBC 7.9  NEUTROABS 5.9  HGB 13.8  HCT 40.0  MCV 100.0  PLT 170   Basic Metabolic Panel: Recent Labs  Lab 11/02/24 0934  NA 138  K 2.6*  CL 84*  CO2 30  GLUCOSE 109*  BUN 18  CREATININE 1.12*  CALCIUM 9.0   GFR: Estimated Creatinine Clearance: 55.2 mL/min (A) (by C-G formula based on SCr of 1.12 mg/dL (H)). Liver Function Tests: Recent Labs  Lab 11/02/24 0934  AST 43*  ALT 19  ALKPHOS 50  BILITOT 4.2*  PROT 8.8*  ALBUMIN 4.6   No results for input(s): LIPASE, AMYLASE in the last 168 hours. No results for input(s): AMMONIA in the last 168 hours. Coagulation Profile: Recent Labs  Lab 11/02/24 0934  INR 1.0   Cardiac Enzymes: Recent Labs  Lab 11/02/24 0934  CKTOTAL 468*   BNP (last 3 results) No results for input(s): PROBNP in the last 8760 hours. HbA1C: No results for input(s): HGBA1C in the last 72 hours. CBG: No results for input(s): GLUCAP in the last 168 hours. Lipid Profile: No results for input(s): CHOL, HDL, LDLCALC, TRIG, CHOLHDL, LDLDIRECT in the last 72 hours. Thyroid Function Tests: No results for input(s): TSH, T4TOTAL, FREET4, T3FREE, THYROIDAB in the last 72 hours. Anemia Panel: No results for input(s): VITAMINB12, FOLATE, FERRITIN, TIBC, IRON, RETICCTPCT in the last 72 hours. Urine analysis:    Component Value Date/Time   COLORURINE AMBER (A) 11/02/2024 0949   APPEARANCEUR HAZY (A) 11/02/2024 0949   LABSPEC 1.026 11/02/2024 0949   PHURINE 5.0 11/02/2024 0949   GLUCOSEU NEGATIVE 11/02/2024 0949   HGBUR SMALL (A) 11/02/2024 0949   BILIRUBINUR NEGATIVE 11/02/2024 0949   KETONESUR 20 (A) 11/02/2024 0949   PROTEINUR 30 (A) 11/02/2024 0949   NITRITE NEGATIVE 11/02/2024 0949   LEUKOCYTESUR NEGATIVE 11/02/2024 0949    Radiological Exams on Admission: CT Lumbar Spine Wo Contrast Result Date: 11/02/2024 EXAM: CT OF THE LUMBAR SPINE WITHOUT CONTRAST 11/02/2024 10:24:52 AM  TECHNIQUE: CT of the lumbar spine was performed without the administration of intravenous contrast. Multiplanar reformatted images are provided for review. Automated exposure control, iterative reconstruction, and/or weight based adjustment of the mA/kV was utilized to reduce the radiation dose to as low as reasonably achievable. COMPARISON: Lumbar MRI 03/05/last year. CLINICAL HISTORY: 69  year old female. Trauma. FINDINGS: BONES AND ALIGNMENT: Normal lumbar segmentation. Chronic straightening of lumbar lordosis. Subtle chronic anterolisthesis of L3 on L4 is stable. Prior cholecystectomy. Chronic appearing and unhealed right L3 transverse process fracture (series 5 image 68). Generalized osteopenia which seems mildly advanced for age. Intact visible sacrum and SI joints. No acute osseous abnormality. No acute traumatic injury identified in the lumbar spine. SOFT TISSUES: Calcified aortoiliac atherosclerosis. Normal caliber abdominal aorta. Partially visible diverticulosis of the large bowel. Negative lumbar paraspinal soft tissues. DEGENERATIVE CHANGES: Chronic lumbar spine degeneration, similar to the MRI last year. Degenerative vacuum disc has progressed at both L1-L2 and L4-L5. Chronic grade 1 anterolisthesis at L3-L4 associated with chronic disc bulging and moderate facet arthropathy. 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 At L5-S1, right foraminal disc or disc osteophyte complex seems progressed from last year on series 6 image 107. Query right L5 radiculitis. IMPRESSION: 1. No acute traumatic injury identified in the lumbar spine. Chronic right L3 transverse process fracture. 2. Chronic lumbar spine degeneration, similar to MRI on 03/05/2023, but suspicion of progressed right foraminal disc or disc osteophyte complex at L5-S1 (series 6 image 107). Query right L5 radiculitis. Electronically signed by: Helayne Hurst MD 11/02/2024 10:57 AM EST RP Workstation: [id number]   CT  Cervical Spine Wo Contrast Result Date: 11/02/2024 EXAM: CT CERVICAL SPINE WITHOUT CONTRAST 11/02/2024 10:24:52 AM TECHNIQUE: CT of the cervical spine was performed without the administration of intravenous contrast. Multiplanar reformatted images are provided for review. Automated exposure control, iterative reconstruction, and/or weight based adjustment of the mA/kV was utilized to reduce the radiation dose to as low as reasonably achievable. COMPARISON: Head CT 11/02/2024 (reported separately). Cervical spine MRI 05/28/2023. CLINICAL HISTORY: 69 year old female. Trauma. FINDINGS: CERVICAL SPINE: BONES AND ALIGNMENT: Mild extra convex cervical scoliosis. Mild straightening of lordosis is stable. No acute fracture or traumatic malalignment. DEGENERATIVE CHANGES: Chronic cervical spine degeneration, including disc and endplate degeneration most pronounced at C5-C6, appears stable from the MRI 05/28/2023. SOFT TISSUES: No prevertebral soft tissue swelling. Bulky calcified atherosclerosis at the skull base. Bilateral carotid bifurcation calcified atherosclerosis. Negative visible non-contrast thoracic inlet. IMPRESSION: 1. No acute traumatic injury identified in the cervical spine. 2. Stable chronic cervical spine degeneration from MRI last year. Electronically signed by: Helayne Hurst MD 11/02/2024 10:51 AM EST RP Workstation: [id number]   CT HEAD WO CONTRAST ( ) Result Date: 11/02/2024 EXAM: CT HEAD WITHOUT CONTRAST 11/02/2024 10:24:52 AM TECHNIQUE: CT of the head was performed without the administration of intravenous contrast. Automated exposure control, iterative reconstruction, and/or weight based adjustment of the mA/kV was utilized to reduce the radiation dose to as low as reasonably achievable. COMPARISON: Brain MRI 05/30/2023. CLINICAL HISTORY: 69 year old female. Trauma. FINDINGS: BRAIN AND VENTRICLES: No acute hemorrhage. No evidence of acute infarct. No hydrocephalus. No extra-axial collection. No  mass effect or midline shift. Stable brain volume with minimal limits for age bulky calcified atherosclerosis at the skull base. No suspicious intracranial vascular hyperdensity. Chronic cerebral white matter changes most apparent by CT near the left frontal horns. Asymmetric left corona radiata involvement appears chronic and stable. 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 ORBITS: No acute abnormality. SINUSES: Stable mild right mastoid effusion. Elsewhere middle ears, mastoids, paranasal sinuses well aerated. SOFT TISSUES AND SKULL: No acute soft tissue abnormality. No skull fracture. IMPRESSION: 1. No acute traumatic injury identified. 2. Stable chronic small vessel disease from MRI last year. Electronically signed by: Helayne Hurst MD  11/02/2024 10:49 AM EST RP Workstation: [id number]    EKG: Independently reviewed.  Sinus rhythm, low voltage EKG.  Assessment/Plan Principal Problem:   Hypokalemia  (please populate well all problems here in Problem List. (For example, if patient is on BP meds at home and you resume or decide to hold them, it is a problem that needs to be her. Same for CAD, COPD, HLD and so on)  Hypokalemia - Likely secondary to diarrhea - P.o. and IV replacement, recheck K level this evening - Check magnesium proximal level  Mechanical fall - Probably secondary to severe hypokalemia - Other DDx, patient denies any prodromes of lightheadedness palpitation nauseous vomiting, no LOC, low suspicion for syncope/near syncope.  Physical exam showed no focal neurological deficit, low suspicion for stroke. - PT evaluation  Acute diarrhea - Check GI pathogen panel - Consider Imodium if infectious diarrhea rule out  HTN - Again there is a low potassium level,, hold off on BP meds of hydrochlorothiazide today, started as needed hydralazine  Cognitive impairment - Mentation at baseline  Chronic pain syndrome - Continue gabapentin - Continue Norco   DVT  prophylaxis: Lovenox Code Status: Full code Family Communication: None at bedside Disposition Plan: Expect less than 2 midnight hospital stay Consults called: None Admission status: Telemetry observation   Cort ONEIDA Mana MD [facility] Pager [phone]  11/02/2024, 12:05 PM

## 2024-11-02 NOTE — ED Notes (Signed)
 Called CCMD to add pt to monitoring.

## 2024-11-02 NOTE — ED Triage Notes (Signed)
 Pt arrives from home via [facility] with c/o falling from bed on Saturday. Per EMS, pt was last seen on Friday, last talked to on Saturday, hx of stroke and neuropathy. Pt is NAD at this time, A&Ox4.

## 2024-11-02 NOTE — ED Provider Notes (Signed)
 [facility] Provider Note    Event Date/Time   First MD Initiated Contact with Patient 11/02/24 [phone]     (approximate)   History   Fall  Pt arrives from home via [facility] with c/o falling from bed on Saturday. Per EMS, pt was last seen on Friday, last talked to on Saturday, hx of stroke and neuropathy. Pt is NAD at this time, A&Ox4.    HPI Sandy Byrd is a 69 y.o. female hx (multiple prior strokes), cognitive impairment, chronic low back pain / chronic pain syndrome sent for evaluation after being found down -Patient believes she fell sometime Saturday evening, has been unable to get up since then.  Somewhat limited historian, was not able to tell me what caused her to fall.  Denies any pain other than her chronic midline low back pain.  Says she was otherwise been in her usual state of health. -Lives alone with her dog -Denies any new focal weakness     Physical Exam   Triage Vital Signs: BP 111/76 (BP Location: Left Arm)   Pulse 64   Temp 97.9 F (36.6 C)   Resp 16   Ht 5' 7 (1.702 m)   Wt 89.4 kg   SpO2 100%   BMI 30.85 kg/m    Most recent vital signs: Vitals:   11/02/24 1400 11/02/24 1439  BP:  111/76  Pulse:  64  Resp:  16  Temp: 98.1 F (36.7 C) 97.9 F (36.6 C)  SpO2:  100%     General: Awake, no distress.  CV:  Good peripheral perfusion. RRR, RP 2+ Resp:  Normal effort. CTAB Abd:  No distention. Nontender to deep palpation throughout Neuro:  Aox4, CN II-XII intact, FNF wnl, finger taps fast b/l, 5/5 strength in bilateral finger extension/grip, arm flexion/extension, EHL/FHL. BUE AG 10+ sec no drift, BLE AG 5+ sec no drift.  Gait testing deferred.  SILT.      ED Results / Procedures / Treatments   Labs (all labs ordered are listed, but only abnormal results are displayed) Labs Reviewed  CBC WITH DIFFERENTIAL/PLATELET - Abnormal; Notable for the following components:      Result Value   MCH 34.5 (*)    nRBC 0.3  (*)    All other components within normal limits  COMPREHENSIVE METABOLIC PANEL WITH GFR - Abnormal; Notable for the following components:   Potassium 2.6 (*)    Chloride 84 (*)    Glucose, Bld 109 (*)    Creatinine, Ser 1.12 (*)    Total Protein 8.8 (*)    AST 43 (*)    Total Bilirubin 4.2 (*)    GFR, Estimated 54 (*)    Anion gap 24 (*)    All other components within normal limits  CK - Abnormal; Notable for the following components:   Total CK 468 (*)    All other components within normal limits  URINALYSIS, COMPLETE (UACMP) WITH MICROSCOPIC - Abnormal; Notable for the following components:   Color, Urine AMBER (*)    APPearance HAZY (*)    Hgb urine dipstick SMALL (*)    Ketones, ur 20 (*)    Protein, ur 30 (*)    Bacteria, UA FEW (*)    All other components within normal limits  MAGNESIUM - Abnormal; Notable for the following components:   Magnesium 3.2 (*)    All other components within normal limits  PHOSPHORUS - Abnormal; Notable for the following components:  Phosphorus 2.2 (*)    All other components within normal limits  BILIRUBIN, DIRECT - Abnormal; Notable for the following components:   Bilirubin, Direct 1.3 (*)    All other components within normal limits  GASTROINTESTINAL PANEL BY PCR, STOOL (REPLACES STOOL CULTURE)  PROTIME-INR  HIV ANTIBODY (ROUTINE TESTING W REFLEX)  POTASSIUM  TSH  HEPATITIS PANEL, ACUTE  TROPONIN I (HIGH SENSITIVITY)  TROPONIN I (HIGH SENSITIVITY)     EKG  Ecg = this rhythm, rate 89, no gross ST elevation or depression, no significant repolarization abnormality, right axis deviation, normal intervals except for first-degree AV block.  No clear evidence of ischemia nor arrhythmia on my interpretation.   RADIOLOGY Radiology interpreted by myself radiology reports reviewed.  No acute pathology identified.    PROCEDURES:  Critical Care performed: yes  .Critical Care  Performed by: Clarine Ozell LABOR, MD Authorized by: Clarine Ozell LABOR, MD   Critical care provider statement:    Critical care time (minutes):  30   Critical care time was exclusive of:  Separately billable procedures and treating other patients   Critical care was necessary to treat or prevent imminent or life-threatening deterioration of the following conditions:  Metabolic crisis   Critical care was time spent personally by me on the following activities:  Development of treatment plan with patient or surrogate, discussions with consultants, evaluation of patient's response to treatment, examination of patient, ordering and review of laboratory studies, ordering and review of radiographic studies, ordering and performing treatments and interventions, pulse oximetry, re-evaluation of patient's condition and review of old charts   I assumed direction of critical care for this patient from another provider in my specialty: no     Care discussed with: admitting provider      MEDICATIONS ORDERED IN ED: Medications  HYDROcodone -acetaminophen  (NORCO) 7.5-325 MG per tablet 1 tablet (1 tablet Oral Given 11/02/24 1536)  escitalopram (LEXAPRO) tablet 20 mg (20 mg Oral Given 11/02/24 1401)  zolpidem (AMBIEN) tablet 5 mg (has no administration in time range)  levothyroxine (SYNTHROID) tablet 100 mcg (has no administration in time range)  pantoprazole (PROTONIX) EC tablet 40 mg (40 mg Oral Given 11/02/24 1401)  mirabegron ER (MYRBETRIQ) tablet 50 mg (50 mg Oral Given 11/02/24 1401)  baclofen (LIORESAL) tablet 10 mg (10 mg Oral Given 11/02/24 1402)  pregabalin  (LYRICA ) capsule 100 mg (100 mg Oral Given 11/02/24 1531)  rOPINIRole (REQUIP) tablet 1 mg (has no administration in time range)  azelastine (ASTELIN) 0.1 % nasal spray 1 spray (1 spray Each Nare Given 11/02/24 1403)  fluticasone (FLONASE) 50 MCG/ACT nasal spray 2 spray (has no administration in time range)  albuterol (PROVENTIL) (2.5 MG/3ML) 0.083% nebulizer solution 2.5 mg (has no administration in time range)   cycloSPORINE (RESTASIS) 0.05 % ophthalmic emulsion 1 drop (1 drop Both Eyes Given 11/02/24 1402)  pilocarpine (SALAGEN) tablet 5 mg (5 mg Oral Given 11/02/24 1401)  enoxaparin (LOVENOX) injection 40 mg (has no administration in time range)  hydrALAZINE (APRESOLINE) injection 5 mg (has no administration in time range)  acetaminophen  (TYLENOL ) tablet 650 mg (has no administration in time range)    Or  acetaminophen  (TYLENOL ) suppository 650 mg (has no administration in time range)  ondansetron (ZOFRAN) tablet 4 mg (has no administration in time range)    Or  ondansetron (ZOFRAN) injection 4 mg (has no administration in time range)  phosphorus (K PHOS NEUTRAL) tablet 500 mg (500 mg Oral Given 11/02/24 1531)  Influenza vac split trivalent PF (FLUZONE  HIGH-DOSE) injection 0.5 mL (has no administration in time range)  feeding supplement (ENSURE PLUS HIGH PROTEIN) liquid 237 mL (237 mLs Oral Given 11/02/24 1532)  potassium chloride (KLOR-CON) packet 40 mEq (40 mEq Oral Given 11/02/24 1104)  potassium chloride 10 mEq in 100 mL IVPB (0 mEq Intravenous Stopped 11/02/24 1251)  magnesium sulfate IVPB 2 g 50 mL (0 g Intravenous Stopped 11/02/24 1251)  sodium chloride  0.9 % bolus 500 mL (0 mLs Intravenous Stopped 11/02/24 1354)     IMPRESSION / MDM / ASSESSMENT AND PLAN / ED COURSE  I reviewed the triage vital signs and the nursing notes.                              DDX/MDM/AP: Differential diagnosis includes, but is not limited to, consider intracranial hemorrhage, skull fracture, lumbar fracture.  Consider underlying electrolyte abnormality, UTI.  Doubt pelvic fracture.  No clear evidence of stroke on my eval.  Consider rhabdomyolysis.  Plan: - Labs - EKG - CT head, CT C-spine, CT L-spine - Small bolus IV fluid - Reassess  Patient's presentation is most consistent with acute presentation with potential threat to life or bodily function.  The patient is on the cardiac monitor to evaluate for  evidence of arrhythmia and/or significant heart rate changes.  ED course below.  Labs notable for sever hypokalemia, repleted p.o. and IV.  Also empirically repleted magnesium.  Also with AKI and mild CK elevation.  Some bilirubin elevation of unclear etiology.  Admitted to hospitalist service.  Clinical Course as of 11/02/24 1628  Mon Nov 02, 2024  1040 UA not c/w infxn [MM]  1040 Cmp w/ hypokalemia, mild AKI, hyperbilirubinemia  CK mildly elevated  Troponin normal [MM]  1110 CTH / CTCspine: IMPRESSION: 1. No acute traumatic injury identified. 2. Stable chronic small vessel disease from MRI last year.   [MM]  1111 CT Lspine: IMPRESSION: 1. No acute traumatic injury identified in the lumbar spine. Chronic right L3 transverse process fracture. 2. Chronic lumbar spine degeneration, similar to MRI on 03/05/2023, but suspicion of progressed right foraminal disc or disc osteophyte complex at L5-S1 (series 6 image 107). Query right L5 radiculitis.   [MM]  1118 Patient reevaluated, again denies any abdominal pain or vomiting, repeat abdominal exam with no tenderness, doubt underlying acute surgical pathology regarding hyperbilirubinemia  Suspect weakness secondary to hypokalemia.  Patient is amenable to admission.  Hospitalist consult order placed [MM]  1129 XR pelvis interpreted by myself, no acute pathology identified [MM]  1130 Chest x-ray interpreted by myself, no acute pathology identified [MM]  1131 Presented to hospitalist for admission [MM]    Clinical Course User Index [MM] Clarine Ozell LABOR, MD     FINAL CLINICAL IMPRESSION(S) / ED DIAGNOSES   Final diagnoses:  Fall, initial encounter  Weakness  AKI (acute kidney injury)  Hypokalemia     Rx / DC Orders   ED Discharge Orders     None        Note:  This document was prepared using Dragon voice recognition software and may include unintentional dictation errors.   Clarine Ozell LABOR, MD 11/02/24 [phone]

## 2024-11-03 DIAGNOSIS — E876 Hypokalemia: Secondary | ICD-10-CM | POA: Diagnosis not present

## 2024-11-03 LAB — IRON AND TIBC
Iron: 76 ug/dL (ref 28–170)
Saturation Ratios: 26 % (ref 10.4–31.8)
TIBC: 290 ug/dL (ref 250–450)
UIBC: 214 ug/dL

## 2024-11-03 LAB — CBC
HCT: 35.9 % — ABNORMAL LOW (ref 36.0–46.0)
Hemoglobin: 12.1 g/dL (ref 12.0–15.0)
MCH: 34.1 pg — ABNORMAL HIGH (ref 26.0–34.0)
MCHC: 33.7 g/dL (ref 30.0–36.0)
MCV: 101.1 fL — ABNORMAL HIGH (ref 80.0–100.0)
Platelets: 156 K/uL (ref 150–400)
RBC: 3.55 MIL/uL — ABNORMAL LOW (ref 3.87–5.11)
RDW: 14.9 % (ref 11.5–15.5)
WBC: 6.9 K/uL (ref 4.0–10.5)
nRBC: 0.4 % — ABNORMAL HIGH (ref 0.0–0.2)

## 2024-11-03 LAB — COMPREHENSIVE METABOLIC PANEL WITH GFR
ALT: 19 U/L (ref 0–44)
AST: 40 U/L (ref 15–41)
Albumin: 3.9 g/dL (ref 3.5–5.0)
Alkaline Phosphatase: 44 U/L (ref 38–126)
Anion gap: 16 — ABNORMAL HIGH (ref 5–15)
BUN: 15 mg/dL (ref 8–23)
CO2: 32 mmol/L (ref 22–32)
Calcium: 8.4 mg/dL — ABNORMAL LOW (ref 8.9–10.3)
Chloride: 91 mmol/L — ABNORMAL LOW (ref 98–111)
Creatinine, Ser: 0.86 mg/dL (ref 0.44–1.00)
GFR, Estimated: >60 mL/min (ref >60)
Glucose, Bld: 91 mg/dL (ref 70–99)
Potassium: 3.3 mmol/L — ABNORMAL LOW (ref 3.5–5.1)
Sodium: 139 mmol/L (ref 135–145)
Total Bilirubin: 2.9 mg/dL — ABNORMAL HIGH (ref 0.0–1.2)
Total Protein: 7.5 g/dL (ref 6.5–8.1)

## 2024-11-03 LAB — PHOSPHORUS: Phosphorus: 3.4 mg/dL (ref 2.5–4.6)

## 2024-11-03 LAB — CK: Total CK: 307 U/L — ABNORMAL HIGH (ref 38–234)

## 2024-11-03 LAB — FOLATE: Folate: 5.1 ng/mL — ABNORMAL LOW (ref >5.9)

## 2024-11-03 MED ORDER — POTASSIUM CHLORIDE CRYS ER 20 MEQ PO TBCR
40.0000 meq | EXTENDED_RELEASE_TABLET | Freq: Once | ORAL | Status: AC
  Administered 2024-11-03: 40 meq via ORAL
  Filled 2024-11-03: qty 2

## 2024-11-03 NOTE — NC FL2 (Signed)
 [place]  MEDICAID [id number] LEVEL OF CARE FORM     IDENTIFICATION  Patient Name: Sandy Byrd Birthdate: 06-Mar-1955 Sex: female Admission Date (Current Location): 11/02/2024  [facility] and [place] Number:  Chiropodist and Address:  [facility], [address] [place], [place] [address]      Provider Number: [id number]  Attending Physician Name and Address:  Leesa Kast, DO  Relative Name and Phone Number:  [place], Ferrara Pricilla)  [phone]    Current Level of Care: Hospital Recommended Level of Care: Skilled Nursing Facility Prior Approval Number:    Date Approved/Denied:   PASRR Number:    Discharge Plan: SNF    Current Diagnoses: Patient Active Problem List   Diagnosis Date Noted   Hypokalemia 11/02/2024   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/2024   Sacroiliac joint pain 04/07/2024   Piriformis syndrome of right side 04/07/2024   Lumbar radiculopathy 03/07/2023   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/2023   Complex tear of meniscus of right knee 03/07/2023   Sprain of medial collateral ligament of right knee 03/07/2023   Primary osteoarthritis of right knee 02/20/2023   Chronic pain of both shoulders (right greater than left) 12/27/2022   Rotator cuff arthropathy of both shoulders 12/27/2022   Myofascial pain syndrome, cervical 12/27/2022   Chronic pain of right knee 06/26/2022   Lumbar facet joint syndrome 02/08/2022   Lumbar degenerative disc disease 02/08/2022   Chronic pain syndrome 02/08/2022   H/O rotator cuff surgery (bilateral) 02/08/2022    Orientation RESPIRATION BLADDER Height & Weight     Self    Continent Weight: 89.4 kg Height:  5' 7 (170.2 cm)  BEHAVIORAL SYMPTOMS/MOOD NEUROLOGICAL BOWEL NUTRITION STATUS      Continent Diet (Regular Diet w/ Thin Liquids)  AMBULATORY STATUS COMMUNICATION OF NEEDS Skin   Extensive Assist Verbally                         Personal Care Assistance Level of Assistance   Bathing, Feeding, Dressing Bathing Assistance: Limited assistance Feeding assistance: Independent Dressing Assistance: Limited assistance     Functional Limitations Info             SPECIAL CARE FACTORS FREQUENCY  PT (By licensed PT), OT (By licensed OT)     PT Frequency: 5 x week OT Frequency: 5 x week            Contractures      Additional Factors Info  Code Status, Allergies Code Status Info: FULL Allergies Info: Cephalexin, Cephalosporins, Prochlorperazine, Shrimp Extract, Bee Venom, Latex, Other           Current Medications (11/03/2024):  This is the current hospital active medication list Current Facility-Administered Medications  Medication Dose Route Frequency Provider Last Rate Last Admin   acetaminophen  (TYLENOL ) tablet 650 mg  650 mg Oral Q6H PRN Laurita Manor T, MD       Or   acetaminophen  (TYLENOL ) suppository 650 mg  650 mg Rectal Q6H PRN Laurita Manor T, MD       albuterol (PROVENTIL) (2.5 MG/3ML) 0.083% nebulizer solution 2.5 mg  2.5 mg Inhalation Q6H PRN Laurita Manor T, MD       azelastine (ASTELIN) 0.1 % nasal spray 1 spray  1 spray Each Nare BID Laurita Manor T, MD   1 spray at 11/03/24 0902   baclofen (LIORESAL) tablet 10 mg  10 mg Oral BID Zhang, Ping T, MD   10 mg at  11/03/24 0901   cycloSPORINE (RESTASIS) 0.05 % ophthalmic emulsion 1 drop  1 drop Both Eyes BID Laurita Manor T, MD   1 drop at 11/03/24 0901   enoxaparin (LOVENOX) injection 40 mg  40 mg Subcutaneous Q24H Laurita Manor T, MD   40 mg at 11/02/24 2016   escitalopram (LEXAPRO) tablet 20 mg  20 mg Oral Daily Laurita Manor T, MD   20 mg at 11/03/24 0901   feeding supplement (ENSURE PLUS HIGH PROTEIN) liquid 237 mL  237 mL Oral BID BM Zhang, Ping T, MD   237 mL at 11/03/24 1326   fluticasone (FLONASE) 50 MCG/ACT nasal spray 2 spray  2 spray Each Nare BID Laurita Manor T, MD   2 spray at 11/03/24 0900   hydrALAZINE (APRESOLINE) injection 5 mg  5 mg Intravenous Q6H PRN Laurita Manor DASEN, MD        HYDROcodone -acetaminophen  (NORCO) 7.5-325 MG per tablet 1 tablet  1 tablet Oral Q8H PRN Laurita Manor T, MD   1 tablet at 11/02/24 1536   levothyroxine (SYNTHROID) tablet 100 mcg  100 mcg Oral Q0600 Laurita Manor T, MD   100 mcg at 11/03/24 0627   mirabegron ER (MYRBETRIQ) tablet 50 mg  50 mg Oral Daily Laurita Manor T, MD   50 mg at 11/02/24 1401   ondansetron (ZOFRAN) tablet 4 mg  4 mg Oral Q6H PRN Laurita Manor T, MD       Or   ondansetron (ZOFRAN) injection 4 mg  4 mg Intravenous Q6H PRN Laurita Manor T, MD       pantoprazole (PROTONIX) EC tablet 40 mg  40 mg Oral Daily Zhang, Ping T, MD   40 mg at 11/03/24 0901   pilocarpine (SALAGEN) tablet 5 mg  5 mg Oral BID Zhang, Ping T, MD   5 mg at 11/03/24 9095   pregabalin  (LYRICA ) capsule 100 mg  100 mg Oral TID Laurita Manor T, MD   100 mg at 11/03/24 0918   rOPINIRole (REQUIP) tablet 1 mg  1 mg Oral QHS Laurita Manor T, MD   1 mg at 11/02/24 2017   zolpidem (AMBIEN) tablet 5 mg  5 mg Oral QHS PRN Laurita Manor DASEN, MD         Discharge Medications: Please see discharge summary for a list of discharge medications.  Relevant Imaging Results:  Relevant Lab Results:   Additional Information SSN    [id number]  Dalia GORMAN Fuse, RN

## 2024-11-03 NOTE — Progress Notes (Signed)
 PROGRESS NOTE    Sandy Byrd  FMW:[id number] DOB: 18-Sep-1955 DOA: 11/02/2024 PCP: Sherial Bail, MD  Chief Complaint  Patient presents with   [place] [facility]:  Sandy Byrd is a 69 year old with history of cognitive impairment, hypertension, hyperlipidemia, chronic ambulation impairment using her rollator, anxiety, depression, hypothyroidism, who presents with generalized weakness, persistent area, and a new fall at home. Patient had a sinus infection over a year ago for which she took clindamycin.  She reports since that time she has had chronic diarrhea.  She has not been evaluated for this outpatient.  She reports recently the diarrhea has become so severe she has stopped eating to avoid the consequences of diarrhea. Her POA, Lynea, reports that the patient has had worsening cognitive decline over the last many weeks.  The patient is no longer taking her medications, and is no longer eating food even when it is prepared for her.  Prior to arrival the patient suffered a fall at home and was not answering her phone which prompted her friend, Tamecca, to go and check on her. On arrival to the ED she was found to be hypokalemic to 2.4.  Pan scan unremarkable.  She was admitted for workup  Subjective: This morning patient reports that she is not in any pain, she has no acute complaints.  Generally she appears weak.   Objective: Vitals:   11/02/24 1647 11/02/24 2049 11/03/24 0457 11/03/24 0803  BP: (!) 110/90 119/77 112/77 123/80  Pulse: (!) 57 (!) 56 (!) 52 (!) 53  Resp: 18 16 20 17   Temp: 97.7 F (36.5 C) (!) 97.5 F (36.4 C) 97.6 F (36.4 C) 97.7 F (36.5 C)  TempSrc: Oral     SpO2: 97% 100% 100% 95%  Weight:      Height:        Intake/Output Summary (Last 24 hours) at 11/03/2024 1457 Last data filed at 11/03/2024 1025 Gross per 24 hour  Intake 0 ml  Output 250 ml  Net -250 ml   Filed Weights   11/02/24 0932  Weight: 89.4 kg     Examination: General exam: Appears calm and comfortable, NAD  Respiratory system: No work of breathing, symmetric chest wall expansion Cardiovascular system: S1 & S2 heard, RRR.  Gastrointestinal system: Abdomen is nondistended, soft and nontender.  Neuro: Alert and oriented. No focal neurological deficits. Extremities: Symmetric, expected ROM Skin: No rashes, lesions Psychiatry: Demonstrates appropriate judgement and insight. Mood & affect appropriate for situation.   Assessment & Plan:  Principal Problem:   Hypokalemia    Hypokalemia - Likely secondary to diarrhea - Continue replacement - Monitor potassium - Check mag and Phos.  Follow daily  Mechanical fall Peripheral neuropathy - Has had progressive weakness.  Likely complicated by severe hypokalemia and hypothyroid - Pan scans negative - PT/OT eval's.  Patient will benefit from SNF  Chronic diarrhea - Patient reports that she has had diarrhea over the last year after 1 round of clindamycin for sinus infection. - GI pathogen panel, C. difficile testing still pending.  No diarrhea for sample so far today - Patient has recently been taking Imodium.  Hypertension - Takes HCTZ at home.  Will hold given hypokalemia  Dementia - At baseline patient is ambulatory and independent for some ADLs.  Her friend and POA, Aalyiah, reports that she has been progressively declining.  Was recently seen by neurology Dr. Maree who felt that repeat brain MRI was of little utility.  She has known chronic small vessel disease and brain atrophy - Patient is currently in the process of building a house closer to her POA with the objective to continue to live independently. - She appears very deconditioned at this time and would benefit from short rehab stay before returning home with home health.  Chronic pain syndrome Lumbar degenerative disc disease Peripheral neuropathy - Patient is on Lyrica , Norco, baclofen at home. - For now continue  with scheduled baclofen and Lyrica .  Norco available as needed.  Patient is reporting 0 pain. Her POA reports this is highly unusual for her and suggests that this is indicative of further cognitive decline.  Hyperbilirubinemia - Right upper quadrant ultrasound unremarkable.  Patient without gallbladder - LFTs mostly unremarkable.  Elevation may be secondary to CK elevation. - Hepatitis panel negative - HIV negative - No nausea/vomiting/pain.  Continue to monitor for now - Repeat CMP in a.m.  Hypothyroidism - Patient meant to be on Synthroid but does not take it.  TSH 67 - Continue Synthroid at 100 mcg for now.  Will avoid further medication changes as patient has not actually been taking medication at home  Suspected vitamin deficiency - MCV elevated.  Hemoglobin WNL. - Will obtain folate, B12, iron panel.  DVT prophylaxis: Lovenox   Code Status: Full Code Disposition: Will need SNF at discharge.  Pending clinical improvement for now.  Workup ongoing  Consultants:    Procedures:    Antimicrobials:  Anti-infectives (From admission, onward)    None       Data Reviewed: I have personally reviewed following labs and imaging studies CBC: Recent Labs  Lab 11/02/24 0934 11/03/24 0514  WBC 7.9 6.9  NEUTROABS 5.9  --   HGB 13.8 12.1  HCT 40.0 35.9*  MCV 100.0 101.1*  PLT 170 156   Basic Metabolic Panel: Recent Labs  Lab 11/02/24 0934 11/02/24 1255 11/02/24 1706 11/02/24 2229 11/03/24 0514  NA 138  --   --   --  139  K 2.6*  --  2.4* 3.2* 3.3*  CL 84*  --   --   --  91*  CO2 30  --   --   --  32  GLUCOSE 109*  --   --   --  91  BUN 18  --   --   --  15  CREATININE 1.12*  --   --   --  0.86  CALCIUM 9.0  --   --   --  8.4*  MG  --  3.2*  --   --   --   PHOS  --  2.2*  --   --  3.4   GFR: Estimated Creatinine Clearance: 71.9 mL/min (by C-G formula based on SCr of 0.86 mg/dL). Liver Function Tests: Recent Labs  Lab 11/02/24 0934 11/03/24 0514  AST 43* 40   ALT 19 19  ALKPHOS 50 44  BILITOT 4.2* 2.9*  PROT 8.8* 7.5  ALBUMIN 4.6 3.9   CBG: No results for input(s): GLUCAP in the last 168 hours.  No results found for this or any previous visit (from the past 240 hours).   Radiology Studies: US  ABDOMEN LIMITED RUQ (LIVER/GB) Result Date: 11/02/2024 CLINICAL DATA:  73059 Hyperbilirubinemia 26940 EXAM: ULTRASOUND ABDOMEN LIMITED RIGHT UPPER QUADRANT COMPARISON:  12/13/2022 FINDINGS: Gallbladder: Cholecystectomy. Common bile duct: Diameter: 4 mm Liver: Increased echogenicity. No focal lesion identified. No intrahepatic biliary ductal dilation. Portal vein is patent on color Doppler imaging with normal direction  of blood flow towards the liver. Right Kidney: Partially visualized. No mass. No hydronephrosis or nephrolithiasis. Other: None. IMPRESSION: 1. Cholecystectomy. No intrahepatic or extrahepatic biliary ductal dilation. 2. Hepatic steatosis. Electronically Signed   By: Rogelia Myers M.D.   On: 11/02/2024 13:19   DG Pelvis Portable Result Date: 11/02/2024 CLINICAL DATA:  Status post fall EXAM: PORTABLE PELVIS 1 VIEWS COMPARISON:  None Available. FINDINGS: There is no evidence of pelvic fracture or diastasis. No pelvic bone lesions are seen. IMPRESSION: No acute fracture or dislocation. Electronically Signed   By: Limin  Xu M.D.   On: 11/02/2024 12:06   DG Chest Portable 1 View Result Date: 11/02/2024 CLINICAL DATA:  Status post fall EXAM: PORTABLE CHEST 1 VIEW COMPARISON:  None Available. FINDINGS: Normal lung volumes. No focal consolidations. No pleural effusion or pneumothorax. The heart size and mediastinal contours are within normal limits. No radiographic finding of acute displaced fracture. IMPRESSION: 1. No radiographic finding of acute displaced fracture. 2.  No focal consolidations. Electronically Signed   By: Limin  Xu M.D.   On: 11/02/2024 12:04   CT Lumbar Spine Wo Contrast Result Date: 11/02/2024 EXAM: CT OF THE LUMBAR SPINE  WITHOUT CONTRAST 11/02/2024 10:24:52 AM TECHNIQUE: CT of the lumbar spine was performed without the administration of intravenous contrast. Multiplanar reformatted images are provided for review. Automated exposure control, iterative reconstruction, and/or weight based adjustment of the mA/kV was utilized to reduce the radiation dose to as low as reasonably achievable. COMPARISON: Lumbar MRI 03/05/last year. CLINICAL HISTORY: 69 year old female. Trauma. FINDINGS: BONES AND ALIGNMENT: Normal lumbar segmentation. Chronic straightening of lumbar lordosis. Subtle chronic anterolisthesis of L3 on L4 is stable. Prior cholecystectomy. Chronic appearing and unhealed right L3 transverse process fracture (series 5 image 68). Generalized osteopenia which seems mildly advanced for age. Intact visible sacrum and SI joints. No acute osseous abnormality. No acute traumatic injury identified in the lumbar spine. SOFT TISSUES: Calcified aortoiliac atherosclerosis. Normal caliber abdominal aorta. Partially visible diverticulosis of the large bowel. Negative lumbar paraspinal soft tissues. DEGENERATIVE CHANGES: Chronic lumbar spine degeneration, similar to the MRI last year. Degenerative vacuum disc has progressed at both L1-L2 and L4-L5. Chronic grade 1 anterolisthesis at L3-L4 associated with chronic disc bulging and moderate facet arthropathy. 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 At L5-S1, right foraminal disc or disc osteophyte complex seems progressed from last year on series 6 image 107. Query right L5 radiculitis. IMPRESSION: 1. No acute traumatic injury identified in the lumbar spine. Chronic right L3 transverse process fracture. 2. Chronic lumbar spine degeneration, similar to MRI on 03/05/2023, but suspicion of progressed right foraminal disc or disc osteophyte complex at L5-S1 (series 6 image 107). Query right L5 radiculitis. Electronically signed by: Helayne Hurst MD 11/02/2024 10:57 AM  EST RP Workstation: [id number]   CT Cervical Spine Wo Contrast Result Date: 11/02/2024 EXAM: CT CERVICAL SPINE WITHOUT CONTRAST 11/02/2024 10:24:52 AM TECHNIQUE: CT of the cervical spine was performed without the administration of intravenous contrast. Multiplanar reformatted images are provided for review. Automated exposure control, iterative reconstruction, and/or weight based adjustment of the mA/kV was utilized to reduce the radiation dose to as low as reasonably achievable. COMPARISON: Head CT 11/02/2024 (reported separately). Cervical spine MRI 05/28/2023. CLINICAL HISTORY: 69 year old female. Trauma. FINDINGS: CERVICAL SPINE: BONES AND ALIGNMENT: Mild extra convex cervical scoliosis. Mild straightening of lordosis is stable. No acute fracture or traumatic malalignment. DEGENERATIVE CHANGES: Chronic cervical spine degeneration, including disc and endplate degeneration most  pronounced at C5-C6, appears stable from the MRI 05/28/2023. SOFT TISSUES: No prevertebral soft tissue swelling. Bulky calcified atherosclerosis at the skull base. Bilateral carotid bifurcation calcified atherosclerosis. Negative visible non-contrast thoracic inlet. IMPRESSION: 1. No acute traumatic injury identified in the cervical spine. 2. Stable chronic cervical spine degeneration from MRI last year. Electronically signed by: Helayne Hurst MD 11/02/2024 10:51 AM EST RP Workstation: [id number]   CT HEAD WO CONTRAST ( ) Result Date: 11/02/2024 EXAM: CT HEAD WITHOUT CONTRAST 11/02/2024 10:24:52 AM TECHNIQUE: CT of the head was performed without the administration of intravenous contrast. Automated exposure control, iterative reconstruction, and/or weight based adjustment of the mA/kV was utilized to reduce the radiation dose to as low as reasonably achievable. COMPARISON: Brain MRI 05/30/2023. CLINICAL HISTORY: 69 year old female. Trauma. FINDINGS: BRAIN AND VENTRICLES: No acute hemorrhage. No evidence of acute infarct. No  hydrocephalus. No extra-axial collection. No mass effect or midline shift. Stable brain volume with minimal limits for age bulky calcified atherosclerosis at the skull base. No suspicious intracranial vascular hyperdensity. Chronic cerebral white matter changes most apparent by CT near the left frontal horns. Asymmetric left corona radiata involvement appears chronic and stable. 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 ORBITS: No acute abnormality. SINUSES: Stable mild right mastoid effusion. Elsewhere middle ears, mastoids, paranasal sinuses well aerated. SOFT TISSUES AND SKULL: No acute soft tissue abnormality. No skull fracture. IMPRESSION: 1. No acute traumatic injury identified. 2. Stable chronic small vessel disease from MRI last year. Electronically signed by: Helayne Hurst MD 11/02/2024 10:49 AM EST RP Workstation: [id number]    Scheduled Meds:  azelastine  1 spray Each Nare BID   baclofen  10 mg Oral BID   cycloSPORINE  1 drop Both Eyes BID   enoxaparin (LOVENOX) injection  40 mg Subcutaneous Q24H   escitalopram  20 mg Oral Daily   feeding supplement  237 mL Oral BID BM   fluticasone  2 spray Each Nare BID   levothyroxine  100 mcg Oral Q0600   mirabegron ER  50 mg Oral Daily   pantoprazole  40 mg Oral Daily   pilocarpine  5 mg Oral BID   pregabalin   100 mg Oral TID   rOPINIRole  1 mg Oral QHS   Continuous Infusions:   LOS: 0 days  MDM: Patient is high risk for one or more organ failure.  They necessitate ongoing hospitalization for continued IV therapies and subsequent lab monitoring. Total time spent interpreting labs and vitals, reviewing the medical record, coordinating care amongst consultants and care team members, directly assessing and discussing care with the patient and/or family: 55 min  Jhaden Pizzuto, DO [facility]  To contact the attending physician between 7A-7P please use Epic Chat. To contact the covering physician during after hours  7P-7A, please review Amion.  11/03/2024, 2:57 PM   *This document has been created with the assistance of dictation software. Please excuse typographical errors. *

## 2024-11-03 NOTE — TOC Initial Note (Signed)
 Transition of Care [facility]) - Initial/Assessment Note    Patient Details  Name: Sandy Byrd MRN: [id number] Date of Birth: [birth year]  [facility] CM/SW Contact:    Dalia GORMAN Fuse, RN Phone Number: 11/03/2024, 3:25 PM  Clinical Narrative:                 Therapy recommends SNF. The patient is from home independently. Up until a couple a weeks ago she was independent of ADLs. Her PCP recently advised her not to drive. At baseline she ambulates with a RW. She also has DME cane and shower chair in the home.   The patient's HCPOA was offered choice, but she doesn't have a preference. TOC sent the FL2 out in [employer]  NCMUST is down, TOC will need to request the PASRR at a later time.   Expected Discharge Plan: Skilled Nursing Facility Barriers to Discharge: Continued Medical Work up   Patient Goals and CMS Choice     Choice offered to / list presented to : [facility] POA / Guardian      Expected Discharge Plan and Services   Discharge Planning Services: CM Consult Post Acute Care Choice: Skilled Nursing Facility Living arrangements for the past 2 months: Single Family Home                                      Prior Living Arrangements/Services Living arrangements for the past 2 months: Single Family Home Lives with:: Self              Current home services: DME (RW, [place], [place])    Activities of Daily Living   ADL Screening (condition at time of admission) Independently performs ADLs?: No Does the patient have a NEW difficulty with bathing/dressing/toileting/self-feeding that is expected to last >3 days?: Yes (Initiates electronic notice to provider for possible OT consult) Does the patient have a NEW difficulty with getting in/out of bed, walking, or climbing stairs that is expected to last >3 days?: Yes (Initiates electronic notice to provider for possible PT consult) Does the patient have a NEW difficulty with communication that is expected to last  >3 days?: No Is the patient deaf or have difficulty hearing?: No Does the patient have difficulty seeing, even when wearing glasses/contacts?: No Does the patient have difficulty concentrating, remembering, or making decisions?: No  Permission Sought/Granted                  Emotional Assessment              Admission diagnosis:  Hypokalemia [E87.6] Weakness [R53.1] AKI (acute kidney injury) [N17.9] Fall, initial encounter [W19.XXXA] Patient Active Problem List   Diagnosis Date Noted   Hypokalemia 11/02/2024   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/2024   Sacroiliac joint pain 04/07/2024   Piriformis syndrome of right side 04/07/2024   Lumbar radiculopathy 03/07/2023   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/2023   Complex tear of meniscus of right knee 03/07/2023   Sprain of medial collateral ligament of right knee 03/07/2023   Primary osteoarthritis of right knee 02/20/2023   Chronic pain of both shoulders (right greater than left) 12/27/2022   Rotator cuff arthropathy of both shoulders 12/27/2022   Myofascial pain syndrome, cervical 12/27/2022   Chronic pain of right knee 06/26/2022   Lumbar facet joint syndrome 02/08/2022   Lumbar degenerative disc disease 02/08/2022   Chronic pain syndrome 02/08/2022  H/O rotator cuff surgery (bilateral) 02/08/2022   PCP:  Sherial Bail, MD Pharmacy:   [facility] [address], [place] - [address] [place] [address] Phone: [phone] Fax: [phone]     Social Drivers of Health (SDOH) Social History: SDOH Screenings   Food Insecurity: No Food Insecurity (11/02/2024)  Housing: Low Risk  (11/02/2024)  Transportation Needs: No Transportation Needs (11/02/2024)  Utilities: Not At Risk (11/02/2024)  Depression (PHQ2-9): Low Risk  (09/21/2024)  Recent Concern: Depression (PHQ2-9) - Medium Risk (08/11/2024)  Financial Resource Strain: Low Risk  (10/29/2024)   Received from [facility]  Social Connections: Socially Isolated (11/02/2024)  Tobacco Use: Medium Risk (11/02/2024)   SDOH Interventions:     Readmission Risk Interventions     No data to display

## 2024-11-03 NOTE — Evaluation (Addendum)
 Occupational Therapy Evaluation Patient Details Name: Sandy Byrd MRN: [id number] DOB: 1955-01-15 Today's Date: 11/03/2024   History of Present Illness   Pt is a 69 y.o. female presented with worsening generalized weakness, diarrhea and a fall at home. Current MD assessment: hypokalemia. 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     Clinical Impressions Pt was seen for OT evaluation this date. PTA, pt lives alone in a one level home with level entry. At baseline, pt is MOD I/IND with ADLs and IADLs and mobility using a rollator at all times both household and community distances. Pt presents with deficits in strength, balance, safety and activity tolerance, and issues with orthostatic hypotension affecting safe and optimal ADL completion. Pt currently requires CGA for bed mobility with increased time/effort. Orthostatic vitals measured with nurse tech during session.   BP- Lying Pulse- Lying BP- Sitting Pulse- Sitting BP- Standing at 0 minutes Pulse- Standing at 0 minutes BP standing at 3 minutes Pulse standing at 3 mins  11/03/24 0814 123/80 53 117/74 56 (!) 83/52 64 89/58 64  She was able to stand from EOB to RW with CGA, cues for hand placement. Pt with mild dizziness in standing and noted drop in BP, however pt able to perform step pivot to recliner with increased time and cues for sequencing, Min A overall. Additional BP taken in standing with slight improvement. Pt left seated in recliner with all needs within reach. She is far from her baseline function and lives alone, would recommend further rehab prior to return home. Pt would benefit from skilled OT services to address noted impairments and functional limitations to maximize safety and independence while minimizing future risk of falls, injury, and readmission. Do anticipate the need for follow up OT services upon acute hospital DC.      If plan is  discharge home, recommend the following:   A little help with walking and/or transfers;A lot of help with bathing/dressing/bathroom;A little help with bathing/dressing/bathroom;Help with stairs or ramp for entrance;Assist for transportation;Assistance with cooking/housework     Functional Status Assessment   Patient has had a recent decline in their functional status and demonstrates the ability to make significant improvements in function in a reasonable and predictable amount of time.     Equipment Recommendations   Other (comment) (defer to next venue)     Recommendations for Other Services         Precautions/Restrictions   Precautions Precautions: Fall Recall of Precautions/Restrictions: Intact Precaution/Restrictions Comments: orthostatic positive with standing Restrictions Weight Bearing Restrictions Per Provider Order: No     Mobility Bed Mobility Overal bed mobility: Needs Assistance Bed Mobility: Supine to Sit     Supine to sit: Contact guard, HOB elevated, Used rails     General bed mobility comments: increased time/effort with CGA via HHA    Transfers Overall transfer level: Needs assistance Equipment used: Rolling walker (2 wheels) Transfers: Sit to/from Stand, Bed to chair/wheelchair/BSC Sit to Stand: Contact guard assist     Step pivot transfers: Contact guard assist, Min assist     General transfer comment: pt able to stand from lowest bed height with cues for hand placement, increased time and cues for sequencing during step pivot to recliner using RW; pt with mild dizziness and positive orthostatics from seated to standing position with minimal improvement with extended standing      Balance Overall balance assessment: Needs assistance Sitting-balance support: Feet supported Sitting balance-Leahy Scale: Good  Standing balance support: Reliant on assistive device for balance, Bilateral upper extremity supported Standing  balance-Leahy Scale: Fair Standing balance comment: CGA with RW use                           ADL either performed or assessed with clinical judgement   ADL Overall ADL's : Needs assistance/impaired                                       General ADL Comments: ADL transfer from bed to recliner using RW via step pivot with Min/CGA, increased time and cues as pt orthostatic positive with mild dizziness     Vision         Perception         Praxis         Pertinent Vitals/Pain Pain Assessment Pain Assessment: No/denies pain     Extremity/Trunk Assessment Upper Extremity Assessment Upper Extremity Assessment: Generalized weakness   Lower Extremity Assessment Lower Extremity Assessment: Generalized weakness       Communication Communication Communication: No apparent difficulties   Cognition Arousal: Alert Behavior During Therapy: Flat affect Cognition: No apparent impairments                               Following commands: Intact       Cueing  General Comments   Cueing Techniques: Verbal cues  orthostatic vitals taken with nurse tech during session and pt positive from sitting EOB to standing with mild dizziness   Exercises Other Exercises Other Exercises: Edu on role of OT in acute setting, DC recommendations and importance of fluids/nutrition in recovery process.   Shoulder Instructions      Home Living Family/patient expects to be discharged to:: Private residence Living Arrangements: Alone Available Help at Discharge: Neighbor;Available PRN/intermittently Type of Home: House Home Access: Level entry     Home Layout: One level     Bathroom Shower/Tub: Producer, Television/film/video: Handicapped height     Home Equipment: Shower seat - built in;Rollator (4 wheels);Grab bars - toilet          Prior Functioning/Environment Prior Level of Function : Independent/Modified Independent;Driving              Mobility Comments: MOD I with rollator all the time, community and household distances ADLs Comments: IND    OT Problem List: Decreased strength;Decreased activity tolerance;Impaired balance (sitting and/or standing)   OT Treatment/Interventions: Self-care/ADL training;Therapeutic exercise;Therapeutic activities;DME and/or AE instruction;Patient/family education;Balance training      OT Goals(Current goals can be found in the care plan section)   Acute Rehab OT Goals Patient Stated Goal: improve strength OT Goal Formulation: With patient Time For Goal Achievement: 11/17/24 Potential to Achieve Goals: Good ADL Goals Pt Will Perform Lower Body Bathing: with supervision;sitting/lateral leans;sit to/from stand Pt Will Perform Lower Body Dressing: with supervision;sit to/from stand;sitting/lateral leans Pt Will Transfer to Toilet: with supervision;ambulating   OT Frequency:  Min 2X/week    Co-evaluation              AM-PAC OT 6 Clicks Daily Activity     Outcome Measure Help from another person eating meals?: A Little Help from another person taking care of personal grooming?: A Little Help from another person toileting, which includes using toliet, bedpan,  or urinal?: A Lot Help from another person bathing (including washing, rinsing, drying)?: A Lot Help from another person to put on and taking off regular upper body clothing?: A Little Help from another person to put on and taking off regular lower body clothing?: A Lot 6 Click Score: 15   End of Session Equipment Utilized During Treatment: Gait belt;Rolling walker (2 wheels) Nurse Communication: Mobility status  Activity Tolerance: Patient tolerated treatment well;Treatment limited secondary to medical complications (Comment) (dizziness/orthostatic BP) Patient left: in chair;with call bell/phone within reach;with chair alarm set  OT Visit Diagnosis: Other abnormalities of gait and mobility (R26.89);Muscle  weakness (generalized) (M62.81)                Time: 9242-9178 OT Time Calculation (min): 24 min Charges:  OT General Charges $OT Visit: 1 Visit OT Evaluation $OT Eval Moderate Complexity: 1 Mod OT Treatments $Therapeutic Activity: 8-22 mins Gisele Pack, OTR/L 11/03/24, 10:05 AM  Sparkles Mcneely E Ashaun Gaughan 11/03/2024, 10:00 AM

## 2024-11-03 NOTE — Care Management Obs Status (Signed)
 MEDICARE OBSERVATION STATUS NOTIFICATION   Patient Details  Name: Sandy Byrd MRN: [id number] Date of Birth: 1955/01/15   Medicare Observation Status Notification Given:  Yes    Lakyra Tippins W, CMA 11/03/2024, 12:17 PM

## 2024-11-03 NOTE — Evaluation (Signed)
 Physical Therapy Evaluation Patient Details Name: Sandy Byrd MRN: [id number] DOB: Sep 22, 1955 Today's Date: 11/03/2024  History of Present Illness  Pt is a 69 y.o. female presented with worsening generalized weakness, diarrhea and a fall at home. Current MD assessment: hypokalemia, mechanical fall, and acute diarrhea. 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   Clinical Impression  Pt was pleasant and motivated to participate during the session and put forth good effort throughout. Pt required extra time and effort with below functional tasks but no physical assistance.  Pt did present with min instability in standing most notably during initial stand from seated position but was able to self-correct.  Pt orthostatic with OT earlier this date but was only minimally symptomatic during one of her stands this session with symptoms quickly resolving.  Gait distance limited to two bouts of around 15 feet for toileting this session.  Pt will benefit from continued PT services upon discharge to safely address deficits listed in patient problem list for decreased caregiver assistance and eventual return to PLOF.          If plan is discharge home, recommend the following: A little help with walking and/or transfers;A little help with bathing/dressing/bathroom;Assistance with cooking/housework;Assist for transportation   Can travel by private vehicle   Yes    Equipment Recommendations Other (comment) (TBD at next venue of care (would need a RW if discharges home))  Recommendations for Other Services       Functional Status Assessment Patient has had a recent decline in their functional status and demonstrates the ability to make significant improvements in function in a reasonable and predictable amount of time.     Precautions / Restrictions Precautions Precautions: Fall Recall of Precautions/Restrictions:  Intact Precaution/Restrictions Comments: orthostatic positive with standing Restrictions Weight Bearing Restrictions Per Provider Order: No      Mobility  Bed Mobility Overal bed mobility: Needs Assistance Bed Mobility: Sit to Supine       Sit to supine: Supervision   General bed mobility comments: Mod extra time, effort, and use of the bed rail to go from sit to sup    Transfers Overall transfer level: Needs assistance Equipment used: Rolling walker (2 wheels) Transfers: Sit to/from Stand Sit to Stand: Contact guard assist           General transfer comment: Extra effort required to come to standing with some noted posterior instability upon initial stand that the pt was able to self-correct    Ambulation/Gait Ambulation/Gait assistance: Contact guard assist Gait Distance (Feet): 15 Feet x 2 Assistive device: Rolling walker (2 wheels) Gait Pattern/deviations: Step-through pattern, Decreased step length - right, Decreased step length - left, Trunk flexed, Shuffle Gait velocity: decreased     General Gait Details: Very slow, cautious cadence with short B step length and occasional shuffling but generally steady with no overt LOB  Stairs            Wheelchair Mobility     Tilt Bed    Modified Rankin (Stroke Patients Only)       Balance Overall balance assessment: Needs assistance, History of Falls Sitting-balance support: Feet supported Sitting balance-Leahy Scale: Good     Standing balance support: Reliant on assistive device for balance, Bilateral upper extremity supported, During functional activity Standing balance-Leahy Scale: Poor Standing balance comment: Posterior instability upon standing per above  Pertinent Vitals/Pain Pain Assessment Pain Assessment: No/denies pain    Home Living Family/patient expects to be discharged to:: Private residence Living Arrangements: Alone Available Help at  Discharge: Neighbor;Available PRN/intermittently;Friend(s) Type of Home: House Home Access: Level entry       Home Layout: One level Home Equipment: Shower seat - built in;Rollator (4 wheels);Grab bars - toilet      Prior Function Prior Level of Function : Independent/Modified Independent;Driving;History of Falls (last six months)             Mobility Comments: Mod Ind amb with a rollator mostly household distances, has groceries delivered and uses facility w/c for MD appointments, 2 falls in the last 6 months due to LE buckling, pt unsure of which leg buckles ADLs Comments: Ind with ADLs     Extremity/Trunk Assessment   Upper Extremity Assessment Upper Extremity Assessment: Defer to OT evaluation    Lower Extremity Assessment Lower Extremity Assessment: Generalized weakness       Communication   Communication Communication: No apparent difficulties    Cognition Arousal: Alert Behavior During Therapy: WFL for tasks assessed/performed   PT - Cognitive impairments: No apparent impairments                         Following commands: Intact       Cueing Cueing Techniques: Verbal cues     General Comments General comments (skin integrity, edema, etc.): orthostatic vitals taken with nurse tech during session and pt positive from sitting EOB to standing with mild dizziness    Exercises Other Exercises Other Exercises: Sit to/from stand transfer training from various surfaces   Assessment/Plan    PT Assessment Patient needs continued PT services  PT Problem List Decreased strength;Decreased activity tolerance;Decreased balance;Decreased mobility;Decreased knowledge of use of DME       PT Treatment Interventions DME instruction;Gait training;Functional mobility training;Therapeutic activities;Therapeutic exercise;Balance training;Patient/family education    PT Goals (Current goals can be found in the Care Plan section)  Acute Rehab PT Goals Patient  Stated Goal: Improved strength PT Goal Formulation: With patient Time For Goal Achievement: 11/16/24 Potential to Achieve Goals: Good    Frequency Min 2X/week     Co-evaluation               AM-PAC PT 6 Clicks Mobility  Outcome Measure Help needed turning from your back to your side while in a flat bed without using bedrails?: A Little Help needed moving from lying on your back to sitting on the side of a flat bed without using bedrails?: A Little Help needed moving to and from a bed to a chair (including a wheelchair)?: A Little Help needed standing up from a chair using your arms (e.g., wheelchair or bedside chair)?: A Little Help needed to walk in hospital room?: A Little Help needed climbing 3-5 steps with a railing? : A Little 6 Click Score: 18    End of Session Equipment Utilized During Treatment: Gait belt Activity Tolerance: Patient tolerated treatment well Patient left: in bed;with call bell/phone within reach;with bed alarm set Nurse Communication: Mobility status PT Visit Diagnosis: Unsteadiness on feet (R26.81);History of falling (Z91.81);Difficulty in walking, not elsewhere classified (R26.2);Muscle weakness (generalized) (M62.81)    Time: 9077-8995 PT Time Calculation (min) (ACUTE ONLY): 42 min   Charges:   PT Evaluation $PT Eval Moderate Complexity: 1 Mod PT Treatments $Therapeutic Activity: 8-22 mins PT General Charges $$ ACUTE PT VISIT: 1 Visit  CHARM Sandy Byrd PT, DPT 11/03/24, 10:33 AM

## 2024-11-04 DIAGNOSIS — E876 Hypokalemia: Secondary | ICD-10-CM | POA: Diagnosis not present

## 2024-11-04 LAB — COMPREHENSIVE METABOLIC PANEL WITH GFR
ALT: 18 U/L (ref 0–44)
AST: 33 U/L (ref 15–41)
Albumin: 3.5 g/dL (ref 3.5–5.0)
Alkaline Phosphatase: 40 U/L (ref 38–126)
Anion gap: 15 (ref 5–15)
BUN: 11 mg/dL (ref 8–23)
CO2: 30 mmol/L (ref 22–32)
Calcium: 8 mg/dL — ABNORMAL LOW (ref 8.9–10.3)
Chloride: 91 mmol/L — ABNORMAL LOW (ref 98–111)
Creatinine, Ser: 0.77 mg/dL (ref 0.44–1.00)
GFR, Estimated: >60 mL/min (ref >60)
Glucose, Bld: 92 mg/dL (ref 70–99)
Potassium: 3.3 mmol/L — ABNORMAL LOW (ref 3.5–5.1)
Sodium: 136 mmol/L (ref 135–145)
Total Bilirubin: 2.2 mg/dL — ABNORMAL HIGH (ref 0.0–1.2)
Total Protein: 7.1 g/dL (ref 6.5–8.1)

## 2024-11-04 LAB — CBC WITH DIFFERENTIAL/PLATELET
Abs Immature Granulocytes: 0.03 K/uL (ref 0.00–0.07)
Basophils Absolute: 0.1 K/uL (ref 0.0–0.1)
Basophils Relative: 1 %
Eosinophils Absolute: 0 K/uL (ref 0.0–0.5)
Eosinophils Relative: 1 %
HCT: 33.2 % — ABNORMAL LOW (ref 36.0–46.0)
Hemoglobin: 11.2 g/dL — ABNORMAL LOW (ref 12.0–15.0)
Immature Granulocytes: 1 %
Lymphocytes Relative: 22 %
Lymphs Abs: 1.1 K/uL (ref 0.7–4.0)
MCH: 34.5 pg — ABNORMAL HIGH (ref 26.0–34.0)
MCHC: 33.7 g/dL (ref 30.0–36.0)
MCV: 102.2 fL — ABNORMAL HIGH (ref 80.0–100.0)
Monocytes Absolute: 0.4 K/uL (ref 0.1–1.0)
Monocytes Relative: 8 %
Neutro Abs: 3.3 K/uL (ref 1.7–7.7)
Neutrophils Relative %: 67 %
Platelets: 147 K/uL — ABNORMAL LOW (ref 150–400)
RBC: 3.25 MIL/uL — ABNORMAL LOW (ref 3.87–5.11)
RDW: 14.9 % (ref 11.5–15.5)
WBC: 4.8 K/uL (ref 4.0–10.5)
nRBC: 0 % (ref 0.0–0.2)

## 2024-11-04 LAB — PHOSPHORUS: Phosphorus: 2.2 mg/dL — ABNORMAL LOW (ref 2.5–4.6)

## 2024-11-04 LAB — T4: T4, Total: <0.4 ug/dL — ABNORMAL LOW (ref 4.5–12.0)

## 2024-11-04 LAB — VITAMIN B12: Vitamin B-12: 1150 pg/mL — ABNORMAL HIGH (ref 180–914)

## 2024-11-04 LAB — MAGNESIUM: Magnesium: 2.6 mg/dL — ABNORMAL HIGH (ref 1.7–2.4)

## 2024-11-04 MED ORDER — POTASSIUM CHLORIDE CRYS ER 20 MEQ PO TBCR
40.0000 meq | EXTENDED_RELEASE_TABLET | Freq: Once | ORAL | Status: AC
  Administered 2024-11-04: 40 meq via ORAL
  Filled 2024-11-04: qty 2

## 2024-11-04 MED ORDER — POTASSIUM PHOSPHATES 15 MMOLE/5ML IV SOLN
30.0000 mmol | Freq: Once | INTRAVENOUS | Status: AC
  Administered 2024-11-04: 30 mmol via INTRAVENOUS
  Filled 2024-11-04: qty 10

## 2024-11-04 NOTE — Progress Notes (Incomplete)
 PROGRESS NOTE    Sandy Byrd  FMW:[id number] DOB: [birth year] DOA: 11/02/2024 PCP: Sherial Bail, MD    Brief Narrative:  Sandy Byrd is a 69 year old with history of cognitive impairment, hypertension, hyperlipidemia, chronic ambulation impairment using her rollator, anxiety, depression, hypothyroidism, who presents with generalized weakness, persistent area, and a new fall at home. Patient had a sinus infection over a year ago for which she took clindamycin.  She reports since that time she has had chronic diarrhea.  She has not been evaluated for this outpatient.  She reports recently the diarrhea has become so severe she has stopped eating to avoid the consequences of diarrhea. Her POA, Baylyn, reports that the patient has had worsening cognitive decline over the last many weeks.  The patient is no longer taking her medications, and is no longer eating food even when it is prepared for her.  Prior to arrival the patient suffered a fall at home and was not answering her phone which prompted her friend, Juleen, to go and check on her. On arrival to the ED she was found to be hypokalemic to 2.4.  Pan scan unremarkable.  She was admitted for workup     Assessment and Plan:  Hypokalemia - Likely secondary to diarrhea - K 3.3 - Repleted  Hypophosphatemia - Phos 2.2 - Repleted    Mechanical fall Peripheral neuropathy - Has had progressive weakness.  Likely complicated by severe hypokalemia and hypothyroid - Pan scans negative - PT/OT eval's.  Patient will benefit from SNF   Chronic diarrhea - Patient reports that she has had diarrhea over the last year after 1 round of clindamycin for sinus infection. - GI pathogen panel, C. difficile testing still pending.  No diarrhea for sample so far today - Patient has recently been taking Imodium.   Hypertension - Takes HCTZ at home.  Will hold given hypokalemia   Dementia - At baseline patient is ambulatory and independent for  some ADLs.  Her friend and POA, Doyne, reports that she has been progressively declining.  Was recently seen by neurology Dr. Maree who felt that repeat brain MRI was of little utility.  She has known chronic small vessel disease and brain atrophy - Patient is currently in the process of building a house closer to her POA with the objective to continue to live independently. - She appears very deconditioned at this time and would benefit from short rehab stay before returning home with home health.   Chronic pain syndrome Lumbar degenerative disc disease Peripheral neuropathy - Patient is on Lyrica , Norco, baclofen at home. - For now continue with scheduled baclofen and Lyrica .  Norco available as needed.  Patient is reporting 0 pain. Her POA reports this is highly unusual for her and suggests that this is indicative of further cognitive decline.   Hyperbilirubinemia - Right upper quadrant ultrasound unremarkable.  Patient without gallbladder - LFTs mostly unremarkable.  Elevation may be secondary to CK elevation. - Hepatitis panel negative - HIV negative - No nausea/vomiting/pain.  Continue to monitor for now - Repeat CMP in a.m.   Hypothyroidism - Patient meant to be on Synthroid but does not take it.  TSH 67 - Continue Synthroid at 100 mcg for now.  Will avoid further medication changes as patient has not actually been taking medication at home   Suspected vitamin deficiency - MCV elevated.  Hemoglobin WNL. - Will obtain folate, B12, iron panel.      DVT prophylaxis: enoxaparin (LOVENOX) injection 40  mg Start: 11/02/24 2200  Code Status:   Code Status: Full Code Family Communication: Disposition Plan:  Level of care: Telemetry  Consultants:  ***  Procedures:  ***  Antimicrobials: ***    Subjective: ***  Objective: Vitals:   11/04/24 0512 11/04/24 0830 11/04/24 1600 11/04/24 1942  BP: 102/70 129/81 108/76 123/77  Pulse: (!) 54 (!) 55 (!) 57 (!) 56  Resp: 16 16 18  16   Temp:  (!) 97.5 F (36.4 C) (!) 97.4 F (36.3 C) (!) 97.5 F (36.4 C)  TempSrc:  Oral Oral Oral  SpO2: 97% 100% 100% 98%  Weight:      Height:        Intake/Output Summary (Last 24 hours) at 11/05/2024 0000 Last data filed at 11/04/2024 1900 Gross per 24 hour  Intake 240 ml  Output --  Net 240 ml   Filed Weights   11/02/24 0932  Weight: 89.4 kg    Examination:  General exam: Appears calm and comfortable  Respiratory system: Clear to auscultation. Respiratory effort normal. Cardiovascular system: S1 & S2 heard, RRR. No JVD, murmurs, rubs, gallops or clicks. No pedal edema. Gastrointestinal system: Abdomen is nondistended, soft and nontender. No organomegaly or masses felt. Normal bowel sounds heard. Central nervous system: Alert and oriented. No focal neurological deficits. Extremities: Symmetric 5 x 5 power. Skin: No rashes, lesions or ulcers Psychiatry: Judgement and insight appear normal. Mood & affect appropriate.     Data Reviewed: I have personally reviewed following labs and imaging studies  CBC: Recent Labs  Lab 11/02/24 0934 11/03/24 0514 11/04/24 0555  WBC 7.9 6.9 4.8  NEUTROABS 5.9  --  3.3  HGB 13.8 12.1 11.2*  HCT 40.0 35.9* 33.2*  MCV 100.0 101.1* 102.2*  PLT 170 156 147*   Basic Metabolic Panel: Recent Labs  Lab 11/02/24 0934 11/02/24 1255 11/02/24 1706 11/02/24 2229 11/03/24 0514 11/04/24 0555  NA 138  --   --   --  139 136  K 2.6*  --  2.4* 3.2* 3.3* 3.3*  CL 84*  --   --   --  91* 91*  CO2 30  --   --   --  32 30  GLUCOSE 109*  --   --   --  91 92  BUN 18  --   --   --  15 11  CREATININE 1.12*  --   --   --  0.86 0.77  CALCIUM 9.0  --   --   --  8.4* 8.0*  MG  --  3.2*  --   --   --  2.6*  PHOS  --  2.2*  --   --  3.4 2.2*   GFR: Estimated Creatinine Clearance: 77.2 mL/min (by C-G formula based on SCr of 0.77 mg/dL). Liver Function Tests: Recent Labs  Lab 11/02/24 0934 11/03/24 0514 11/04/24 0555  AST 43* 40 33  ALT 19  19 18   ALKPHOS 50 44 40  BILITOT 4.2* 2.9* 2.2*  PROT 8.8* 7.5 7.1  ALBUMIN 4.6 3.9 3.5   No results for input(s): LIPASE, AMYLASE in the last 168 hours. No results for input(s): AMMONIA in the last 168 hours. Coagulation Profile: Recent Labs  Lab 11/02/24 0934  INR 1.0   Cardiac Enzymes: Recent Labs  Lab 11/02/24 0934 11/03/24 0514  CKTOTAL 468* 307*   BNP (last 3 results) No results for input(s): PROBNP in the last 8760 hours. HbA1C: No results for input(s): HGBA1C in the last  72 hours. CBG: No results for input(s): GLUCAP in the last 168 hours. Lipid Profile: No results for input(s): CHOL, HDL, LDLCALC, TRIG, CHOLHDL, LDLDIRECT in the last 72 hours. Thyroid Function Tests: Recent Labs    11/02/24 0934 11/02/24 1706  TSH  --  67.348*  T4TOTAL <0.4*  --    Anemia Panel: Recent Labs    11/03/24 1728  VITAMINB12 1,150*  FOLATE 5.1*  TIBC 290  IRON 76   Sepsis Labs: No results for input(s): PROCALCITON, LATICACIDVEN in the last 168 hours.  No results found for this or any previous visit (from the past 240 hours).       Radiology Studies: No results found.      Scheduled Meds: . azelastine  1 spray Each Nare BID  . baclofen  10 mg Oral BID  . cycloSPORINE  1 drop Both Eyes BID  . enoxaparin (LOVENOX) injection  40 mg Subcutaneous Q24H  . escitalopram  20 mg Oral Daily  . feeding supplement  237 mL Oral BID BM  . fluticasone  2 spray Each Nare BID  . levothyroxine  100 mcg Oral Q0600  . mirabegron ER  50 mg Oral Daily  . pantoprazole  40 mg Oral Daily  . pilocarpine  5 mg Oral BID  . pregabalin   100 mg Oral TID  . rOPINIRole  1 mg Oral QHS   Continuous Infusions:   LOS: 0 days      Ademide Schaberg Al-Sultani, MD [facility]   If 7PM-7AM, please contact night-coverage

## 2024-11-04 NOTE — TOC Progression Note (Signed)
 Transition of Care [facility]) - Progression Note    Patient Details  Name: Sandy Byrd MRN: [id number] Date of Birth: 1955/05/02  Transition of Care [facility]) CM/SW Contact  Dalia GORMAN Fuse, RN Phone Number: 11/04/2024, 9:09 AM  Clinical Narrative:     Patient with bed offer from [facility] and [facility]. The patient and POA did not have a preference. PR selected due to higher medicare star rating. [place] starting ins auth.  TOC will continue to follow.   Expected Discharge Plan: Skilled Nursing Facility Barriers to Discharge: Continued Medical Work up               Expected Discharge Plan and Services   Discharge Planning Services: CM Consult Post Acute Care Choice: Skilled Nursing Facility Living arrangements for the past 2 months: Single Family Home                                       Social Drivers of Health (SDOH) Interventions SDOH Screenings   Food Insecurity: No Food Insecurity (11/02/2024)  Housing: Low Risk  (11/02/2024)  Transportation Needs: No Transportation Needs (11/02/2024)  Utilities: Not At Risk (11/02/2024)  Depression (PHQ2-9): Low Risk  (09/21/2024)  Recent Concern: Depression (PHQ2-9) - Medium Risk (08/11/2024)  Financial Resource Strain: Low Risk  (10/29/2024)   Received from [facility]  Social Connections: Socially Isolated (11/02/2024)  Tobacco Use: Medium Risk (11/02/2024)    Readmission Risk Interventions     No data to display

## 2024-11-04 NOTE — Progress Notes (Signed)
 Physical Therapy Treatment Patient Details Name: Sandy Byrd MRN: [id number] DOB: Feb 28, 1955 Today's Date: 11/04/2024   History of Present Illness Pt is a 69 y.o. female presented with worsening generalized weakness, diarrhea and a fall at home. Current MD assessment: hypokalemia, mechanical fall, and acute diarrhea. 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    PT Comments  Pt alert, pleasant and agreeable to participate in PT tx. Pt was received in bed, expressed need to void upon author entering room. Pt required modA for bed mobility this date with pt reporting stiffness and soreness throughout her body. STS transfers from various surfaces completed with minA, pt required anterior rocking to gain momentum to complete transfers. Pt amb to/from bathroom in room with very slow cadence, shuffled pattern throughout with max VC for RW positioning. Pt was left semi-reclined in bed at end of session with all needs in reach. The patient would benefit from further skilled PT intervention to continue to progress towards goals.      If plan is discharge home, recommend the following: A little help with walking and/or transfers;A little help with bathing/dressing/bathroom;Assistance with cooking/housework;Assist for transportation   Can travel by private vehicle     Yes  Equipment Recommendations  Other (comment) (TBD)    Recommendations for Other Services       Precautions / Restrictions Precautions Precautions: Fall Recall of Precautions/Restrictions: Intact Precaution/Restrictions Comments: orthostatic positive with standing Restrictions Weight Bearing Restrictions Per Provider Order: No     Mobility  Bed Mobility Overal bed mobility: Needs Assistance Bed Mobility: Supine to Sit, Sit to Supine     Supine to sit: Mod assist, HOB elevated, Used rails Sit to supine: Mod assist   General bed mobility  comments: modA trunk assist to exit bed, assist for BLE to exit bed. Max time/effort to complete task    Transfers Overall transfer level: Needs assistance Equipment used: Rolling walker (2 wheels) Transfers: Sit to/from Stand Sit to Stand: Min assist           General transfer comment: STS from EOB and raised toilet with minA to power up to standing. Pt declined symptoms of dizziness with transfers    Ambulation/Gait Ambulation/Gait assistance: Contact guard assist Gait Distance (Feet): 30 Feet Assistive device: Rolling walker (2 wheels) Gait Pattern/deviations: Shuffle, Decreased stride length, Trunk flexed Gait velocity: decreased     General Gait Details: No LOB throughout. Very slow cadence with shuffled step pattern maintain. Max VC for RW positioning   Stairs             Wheelchair Mobility     Tilt Bed    Modified Rankin (Stroke Patients Only)       Balance Overall balance assessment: Needs assistance Sitting-balance support: Feet supported Sitting balance-Leahy Scale: Good Sitting balance - Comments: steady static and dynamic sitting   Standing balance support: Bilateral upper extremity supported, Reliant on assistive device for balance Standing balance-Leahy Scale: Fair Standing balance comment: requires external support in standing                            Communication Communication Communication: No apparent difficulties  Cognition Arousal: Alert Behavior During Therapy: WFL for tasks assessed/performed   PT - Cognitive impairments: No apparent impairments                         Following commands: Intact  Cueing Cueing Techniques: Verbal cues, Visual cues  Exercises      General Comments General comments (skin integrity, edema, etc.): maxA for pericare in standing      Pertinent Vitals/Pain Pain Assessment Pain Assessment: No/denies pain    Home Living                          Prior  Function            PT Goals (current goals can now be found in the care plan section) Progress towards PT goals: Progressing toward goals    Frequency    Min 2X/week      PT Plan      Co-evaluation              AM-PAC PT 6 Clicks Mobility   Outcome Measure  Help needed turning from your back to your side while in a flat bed without using bedrails?: A Little Help needed moving from lying on your back to sitting on the side of a flat bed without using bedrails?: A Lot Help needed moving to and from a bed to a chair (including a wheelchair)?: A Little Help needed standing up from a chair using your arms (e.g., wheelchair or bedside chair)?: A Little Help needed to walk in hospital room?: A Little Help needed climbing 3-5 steps with a railing? : A Lot 6 Click Score: 16    End of Session Equipment Utilized During Treatment: Gait belt Activity Tolerance: Patient tolerated treatment well Patient left: in bed;with call bell/phone within reach;with bed alarm set Nurse Communication: Mobility status PT Visit Diagnosis: Unsteadiness on feet (R26.81);History of falling (Z91.81);Difficulty in walking, not elsewhere classified (R26.2);Muscle weakness (generalized) (M62.81)     Time: 8852-8777 PT Time Calculation (min) (ACUTE ONLY): 35 min  Charges:    $Gait Training: 8-22 mins $Therapeutic Activity: 8-22 mins PT General Charges $$ ACUTE PT VISIT: 1 Visit                     Janell Axe, SPT

## 2024-11-04 NOTE — Progress Notes (Signed)
 PROGRESS NOTE    Sandy Byrd  FMW:[id number] DOB: [birth year] DOA: 11/02/2024 PCP: Sherial Bail, MD    Brief Narrative:  Sandy Byrd is a 69 year old with history of cognitive impairment, hypertension, hyperlipidemia, chronic ambulation impairment using her rollator, anxiety, depression, hypothyroidism, who presents with generalized weakness, persistent area, and a new fall at home. Patient had a sinus infection over a year ago for which she took clindamycin.  She reports since that time she has had chronic diarrhea.  She has not been evaluated for this outpatient.  She reports recently the diarrhea has become so severe she has stopped eating to avoid the consequences of diarrhea. Her POA, Mahaila, reports that the patient has had worsening cognitive decline over the last many weeks.  The patient is no longer taking her medications, and is no longer eating food even when it is prepared for her.  Prior to arrival the patient suffered a fall at home and was not answering her phone which prompted her friend, Katyra, to go and check on her. On arrival to the ED she was found to be hypokalemic to 2.4.  Pan scan unremarkable.  She was admitted for workup     Assessment and Plan:  Hypokalemia - Likely secondary to diarrhea - K 3.3 - Repleted  Hypophosphatemia - Phos 2.2 - Repleted    Mechanical fall Peripheral neuropathy - Has had progressive weakness.  Likely complicated by severe hypokalemia and hypothyroid - Pan scans negative - PT/OT eval's.  Patient will benefit from SNF   Chronic diarrhea - Patient reports that she has had diarrhea over the last year after 1 round of clindamycin for sinus infection. - GI pathogen panel, C. difficile testing still pending.  No diarrhea for sample so far today - Patient has recently been taking Imodium.   Hypertension - Takes HCTZ at home.  Will hold given hypokalemia  Sinus Bradycardia - Asymptomatic, blood pressure stable - Suspect  possibly secondary to underlying uncontrolled hypothyroidism and electrolyte abnormalities - Not on any AV nodal blocking agents  Dementia - At baseline patient is ambulatory and independent for some ADLs.  Her friend and POA, Talitha, reports that she has been progressively declining.  Was recently seen by neurology Dr. Maree who felt that repeat brain MRI was of little utility.  She has known chronic small vessel disease and brain atrophy - Patient is currently in the process of building a house closer to her POA with the objective to continue to live independently. - She appears very deconditioned at this time and would benefit from short rehab stay before returning home with home health.   Chronic pain syndrome Lumbar degenerative disc disease Peripheral neuropathy - Patient is on Lyrica , Norco, baclofen at home. - For now continue with scheduled baclofen and Lyrica .  Norco available as needed.  Patient is reporting 0 pain. Her POA reports this is highly unusual for her and suggests that this is indicative of further cognitive decline.   Hyperbilirubinemia - Right upper quadrant ultrasound unremarkable.  Patient without gallbladder - LFTs mostly unremarkable.  Elevation may be secondary to CK elevation. - Hepatitis panel negative - HIV negative - No nausea/vomiting/pain.  Continue to monitor for now - Improving   Hypothyroidism - Patient meant to be on Synthroid but does not take it.  TSH 67 - Continue Synthroid at 100 mcg for now.  Will avoid further medication changes as patient has not actually been taking medication at home   Folate deficiency - MCV  elevated.  Hemoglobin WNL. - Fe panel wnl - B12 elevated - Folate low 5.1  - Started folate 1 mg daily   DVT prophylaxis: enoxaparin (LOVENOX) injection 40 mg Start: 11/02/24 2200  Code Status:   Code Status: Full Code Family Communication: None Disposition Plan: SNF, received bed offers, pending insurance auth Level of care:  Telemetry  Consultants:  None  Procedures:  None  Antimicrobials: None    Subjective: Seen at bedside this morning, eating breakfast. No acute events overnight. Denies chest pain, SOB, dizziness, N/V, diarrhea. No BM since admission.   Objective: Vitals:   11/04/24 0512 11/04/24 0830 11/04/24 1600 11/04/24 1942  BP: 102/70 129/81 108/76 123/77  Pulse: (!) 54 (!) 55 (!) 57 (!) 56  Resp: 16 16 18 16   Temp:  (!) 97.5 F (36.4 C) (!) 97.4 F (36.3 C) (!) 97.5 F (36.4 C)  TempSrc:  Oral Oral Oral  SpO2: 97% 100% 100% 98%  Weight:      Height:        Intake/Output Summary (Last 24 hours) at 11/05/2024 0000 Last data filed at 11/04/2024 1900 Gross per 24 hour  Intake 240 ml  Output --  Net 240 ml   Filed Weights   11/02/24 0932  Weight: 89.4 kg    Examination:  General exam: Appears calm and comfortable  Respiratory system: Clear to auscultation. Respiratory effort normal. Cardiovascular system: S1 & S2 heard, RRR. No pedal edema. Gastrointestinal system: Abdomen is nondistended, soft and nontender. No organomegaly or masses felt. Normal bowel sounds heard. Central nervous system: Alert and oriented. No focal neurological deficits. Extremities: Symmetric, normal ROM Skin: No rashes, lesions or ulcers Psychiatry: Judgement and insight appear normal. Mood & affect appropriate.     Data Reviewed: I have personally reviewed following labs and imaging studies  CBC: Recent Labs  Lab 11/02/24 0934 11/03/24 0514 11/04/24 0555  WBC 7.9 6.9 4.8  NEUTROABS 5.9  --  3.3  HGB 13.8 12.1 11.2*  HCT 40.0 35.9* 33.2*  MCV 100.0 101.1* 102.2*  PLT 170 156 147*   Basic Metabolic Panel: Recent Labs  Lab 11/02/24 0934 11/02/24 1255 11/02/24 1706 11/02/24 2229 11/03/24 0514 11/04/24 0555  NA 138  --   --   --  139 136  K 2.6*  --  2.4* 3.2* 3.3* 3.3*  CL 84*  --   --   --  91* 91*  CO2 30  --   --   --  32 30  GLUCOSE 109*  --   --   --  91 92  BUN 18  --   --    --  15 11  CREATININE 1.12*  --   --   --  0.86 0.77  CALCIUM 9.0  --   --   --  8.4* 8.0*  MG  --  3.2*  --   --   --  2.6*  PHOS  --  2.2*  --   --  3.4 2.2*   GFR: Estimated Creatinine Clearance: 77.2 mL/min (by C-G formula based on SCr of 0.77 mg/dL). Liver Function Tests: Recent Labs  Lab 11/02/24 0934 11/03/24 0514 11/04/24 0555  AST 43* 40 33  ALT 19 19 18   ALKPHOS 50 44 40  BILITOT 4.2* 2.9* 2.2*  PROT 8.8* 7.5 7.1  ALBUMIN 4.6 3.9 3.5   No results for input(s): LIPASE, AMYLASE in the last 168 hours. No results for input(s): AMMONIA in the last 168 hours. Coagulation Profile:  Recent Labs  Lab 11/02/24 0934  INR 1.0   Cardiac Enzymes: Recent Labs  Lab 11/02/24 0934 11/03/24 0514  CKTOTAL 468* 307*   BNP (last 3 results) No results for input(s): PROBNP in the last 8760 hours. HbA1C: No results for input(s): HGBA1C in the last 72 hours. CBG: No results for input(s): GLUCAP in the last 168 hours. Lipid Profile: No results for input(s): CHOL, HDL, LDLCALC, TRIG, CHOLHDL, LDLDIRECT in the last 72 hours. Thyroid Function Tests: Recent Labs    11/02/24 0934 11/02/24 1706  TSH  --  67.348*  T4TOTAL <0.4*  --    Anemia Panel: Recent Labs    11/03/24 1728  VITAMINB12 1,150*  FOLATE 5.1*  TIBC 290  IRON 76   Sepsis Labs: No results for input(s): PROCALCITON, LATICACIDVEN in the last 168 hours.  No results found for this or any previous visit (from the past 240 hours).       Radiology Studies: No results found.      Scheduled Meds:  azelastine  1 spray Each Nare BID   baclofen  10 mg Oral BID   cycloSPORINE  1 drop Both Eyes BID   enoxaparin (LOVENOX) injection  40 mg Subcutaneous Q24H   escitalopram  20 mg Oral Daily   feeding supplement  237 mL Oral BID BM   fluticasone  2 spray Each Nare BID   levothyroxine  100 mcg Oral Q0600   mirabegron ER  50 mg Oral Daily   pantoprazole  40 mg Oral Daily    pilocarpine  5 mg Oral BID   pregabalin   100 mg Oral TID   rOPINIRole  1 mg Oral QHS   Continuous Infusions:   LOS: 0 days      Racine Erby Al-Sultani, MD [facility]   If 7PM-7AM, please contact night-coverage

## 2024-11-05 DIAGNOSIS — E876 Hypokalemia: Secondary | ICD-10-CM | POA: Diagnosis not present

## 2024-11-05 LAB — COMPREHENSIVE METABOLIC PANEL WITH GFR
ALT: 18 U/L (ref 0–44)
AST: 36 U/L (ref 15–41)
Albumin: 3.7 g/dL (ref 3.5–5.0)
Alkaline Phosphatase: 49 U/L (ref 38–126)
Anion gap: 13 (ref 5–15)
BUN: 10 mg/dL (ref 8–23)
CO2: 31 mmol/L (ref 22–32)
Calcium: 8.2 mg/dL — ABNORMAL LOW (ref 8.9–10.3)
Chloride: 89 mmol/L — ABNORMAL LOW (ref 98–111)
Creatinine, Ser: 0.82 mg/dL (ref 0.44–1.00)
GFR, Estimated: >60 mL/min (ref >60)
Glucose, Bld: 96 mg/dL (ref 70–99)
Potassium: 3.5 mmol/L (ref 3.5–5.1)
Sodium: 133 mmol/L — ABNORMAL LOW (ref 135–145)
Total Bilirubin: 1.6 mg/dL — ABNORMAL HIGH (ref 0.0–1.2)
Total Protein: 7.5 g/dL (ref 6.5–8.1)

## 2024-11-05 LAB — CBC WITH DIFFERENTIAL/PLATELET
Abs Immature Granulocytes: 0.06 K/uL (ref 0.00–0.07)
Basophils Absolute: 0.1 K/uL (ref 0.0–0.1)
Basophils Relative: 1 %
Eosinophils Absolute: 0 K/uL (ref 0.0–0.5)
Eosinophils Relative: 1 %
HCT: 35.9 % — ABNORMAL LOW (ref 36.0–46.0)
Hemoglobin: 11.9 g/dL — ABNORMAL LOW (ref 12.0–15.0)
Immature Granulocytes: 1 %
Lymphocytes Relative: 28 %
Lymphs Abs: 1.6 K/uL (ref 0.7–4.0)
MCH: 34 pg (ref 26.0–34.0)
MCHC: 33.1 g/dL (ref 30.0–36.0)
MCV: 102.6 fL — ABNORMAL HIGH (ref 80.0–100.0)
Monocytes Absolute: 0.4 K/uL (ref 0.1–1.0)
Monocytes Relative: 8 %
Neutro Abs: 3.5 K/uL (ref 1.7–7.7)
Neutrophils Relative %: 61 %
Platelets: 159 K/uL (ref 150–400)
RBC: 3.5 MIL/uL — ABNORMAL LOW (ref 3.87–5.11)
RDW: 15 % (ref 11.5–15.5)
WBC: 5.7 K/uL (ref 4.0–10.5)
nRBC: 0.4 % — ABNORMAL HIGH (ref 0.0–0.2)

## 2024-11-05 LAB — MAGNESIUM: Magnesium: 2.3 mg/dL (ref 1.7–2.4)

## 2024-11-05 LAB — PHOSPHORUS: Phosphorus: 2.2 mg/dL — ABNORMAL LOW (ref 2.5–4.6)

## 2024-11-05 MED ORDER — FOLIC ACID 1 MG PO TABS
1.0000 mg | ORAL_TABLET | Freq: Every day | ORAL | Status: DC
  Administered 2024-11-05 – 2024-11-06 (×2): 1 mg via ORAL
  Filled 2024-11-05 (×2): qty 1

## 2024-11-05 MED ORDER — K PHOS MONO-SOD PHOS DI & MONO 155-852-130 MG PO TABS
500.0000 mg | ORAL_TABLET | Freq: Once | ORAL | Status: AC
  Administered 2024-11-05: 500 mg via ORAL
  Filled 2024-11-05: qty 2

## 2024-11-05 NOTE — Plan of Care (Signed)

## 2024-11-05 NOTE — Progress Notes (Signed)
 PROGRESS NOTE    Sandy Byrd  FMW:[id number] DOB: May 10, 1955 DOA: 11/02/2024 PCP: Sherial Bail, MD    Brief Narrative:  Sandy Byrd is a 69 year old with history of cognitive impairment, hypertension, hyperlipidemia, chronic ambulation impairment using her rollator, anxiety, depression, hypothyroidism, who presents with generalized weakness, persistent area, and a new fall at home. Patient had a sinus infection over a year ago for which she took clindamycin.  She reports since that time she has had chronic diarrhea.  She has not been evaluated for this outpatient.  She reports recently the diarrhea has become so severe she has stopped eating to avoid the consequences of diarrhea. Her POA, Kamdyn, reports that the patient has had worsening cognitive decline over the last many weeks.  The patient is no longer taking her medications, and is no longer eating food even when it is prepared for her.  Prior to arrival the patient suffered a fall at home and was not answering her phone which prompted her friend, Daionna, to go and check on her. On arrival to the ED she was found to be hypokalemic to 2.4.  Pan scan unremarkable.  She was admitted for workup     Assessment and Plan:  Hypokalemia - resolved - Likely secondary to diarrhea - K 3.5 today  Hypophosphatemia - Phos 2.2 today - Repleted with PO KPhos 30 mmol   Mechanical fall Peripheral neuropathy - Has had progressive weakness.  Likely complicated by severe hypokalemia and hypothyroid - Pan scans negative - PT/OT eval's.  Patient will benefit from SNF   Chronic diarrhea - Patient reports that she has had diarrhea over the last year after 1 round of clindamycin for sinus infection. - GI pathogen panel, C. difficile testing still pending.  No diarrhea for sample so far today - Patient has recently been taking Imodium.   Hypertension - Takes HCTZ at home.  Will hold given hypokalemia - Normotensive   Sinus  Bradycardia - Asymptomatic, blood pressure stable - Suspect possibly secondary to underlying uncontrolled hypothyroidism and electrolyte abnormalities - Not on any AV nodal blocking agents - EKG ordered  Dementia - At baseline patient is ambulatory and independent for some ADLs.  Her friend and POA, Sameerah, reports that she has been progressively declining.  Was recently seen by neurology Dr. Maree who felt that repeat brain MRI was of little utility.  She has known chronic small vessel disease and brain atrophy - Patient is currently in the process of building a house closer to her POA with the objective to continue to live independently. - She appears very deconditioned at this time and would benefit from short rehab stay before returning home with home health.   Chronic pain syndrome Lumbar degenerative disc disease Peripheral neuropathy - Patient is on Lyrica , Norco, baclofen at home. - For now continue with scheduled baclofen and Lyrica .  Norco available as needed.  Patient is reporting 0 pain. Her POA reports this is highly unusual for her and suggests that this is indicative of further cognitive decline.   Hyperbilirubinemia - Right upper quadrant ultrasound unremarkable.  Patient without gallbladder - LFTs mostly unremarkable.  Elevation may be secondary to CK elevation. - Hepatitis panel negative - HIV negative - No nausea/vomiting/pain.  Continue to monitor for now - Improving   Hypothyroidism - Patient meant to be on Synthroid but does not take it.  TSH 67 - Continue Synthroid at 100 mcg for now.  Will avoid further medication changes as patient has not  actually been taking medication at home   Folate deficiency - MCV elevated.  Hemoglobin WNL. - Fe panel wnl - B12 elevated - Folate low 5.1  - Continue folate 1 mg daily   DVT prophylaxis: enoxaparin (LOVENOX) injection 40 mg Start: 11/02/24 2200  Code Status:   Code Status: Full Code Family Communication:  None Disposition Plan: SNF, received bed offers, pending insurance auth Level of care: Telemetry  Consultants:  None  Procedures:  None  Antimicrobials: None    Subjective: Seen at bedside this morning, eating breakfast. No acute events overnight.  Has no complaints today.  Continues to deny chest pain, shortness of breath, abdominal pain, nausea, vomiting.  Objective: Vitals:   11/04/24 1600 11/04/24 1942 11/05/24 0430 11/05/24 0843  BP: 108/76 123/77 113/74 108/65  Pulse: (!) 57 (!) 56 (!) 53 (!) 56  Resp: 18 16 20 18   Temp: (!) 97.4 F (36.3 C) (!) 97.5 F (36.4 C) (!) 96.1 F (35.6 C) 97.6 F (36.4 C)  TempSrc: Oral Oral Axillary Oral  SpO2: 100% 98% 95% 97%  Weight:      Height:        Intake/Output Summary (Last 24 hours) at 11/05/2024 0908 Last data filed at 11/04/2024 1900 Gross per 24 hour  Intake 240 ml  Output --  Net 240 ml   Filed Weights   11/02/24 0932  Weight: 89.4 kg    Examination:  General exam: Appears calm and comfortable  Respiratory system: Clear to auscultation. Respiratory effort normal. Cardiovascular system: S1 & S2 heard, RRR. No pedal edema. Gastrointestinal system: Abdomen is nondistended, soft and nontender. No organomegaly or masses felt. Normal bowel sounds heard. Central nervous system: Alert and oriented. No focal neurological deficits. Extremities: Symmetric, normal ROM Skin: No rashes, lesions or ulcers Psychiatry: Judgement and insight appear normal. Mood & affect appropriate.     Data Reviewed: I have personally reviewed following labs and imaging studies  CBC: Recent Labs  Lab 11/02/24 0934 11/03/24 0514 11/04/24 0555 11/05/24 0504  WBC 7.9 6.9 4.8 5.7  NEUTROABS 5.9  --  3.3 3.5  HGB 13.8 12.1 11.2* 11.9*  HCT 40.0 35.9* 33.2* 35.9*  MCV 100.0 101.1* 102.2* 102.6*  PLT 170 156 147* 159   Basic Metabolic Panel: Recent Labs  Lab 11/02/24 0934 11/02/24 1255 11/02/24 1706 11/02/24 2229 11/03/24 0514  11/04/24 0555 11/05/24 0504  NA 138  --   --   --  139 136 133*  K 2.6*  --  2.4* 3.2* 3.3* 3.3* 3.5  CL 84*  --   --   --  91* 91* 89*  CO2 30  --   --   --  32 30 31  GLUCOSE 109*  --   --   --  91 92 96  BUN 18  --   --   --  15 11 10   CREATININE 1.12*  --   --   --  0.86 0.77 0.82  CALCIUM 9.0  --   --   --  8.4* 8.0* 8.2*  MG  --  3.2*  --   --   --  2.6* 2.3  PHOS  --  2.2*  --   --  3.4 2.2* 2.2*   GFR: Estimated Creatinine Clearance: 75.4 mL/min (by C-G formula based on SCr of 0.82 mg/dL). Liver Function Tests: Recent Labs  Lab 11/02/24 0934 11/03/24 0514 11/04/24 0555 11/05/24 0504  AST 43* 40 33 36  ALT 19 19 18  18  ALKPHOS 50 44 40 49  BILITOT 4.2* 2.9* 2.2* 1.6*  PROT 8.8* 7.5 7.1 7.5  ALBUMIN 4.6 3.9 3.5 3.7   No results for input(s): LIPASE, AMYLASE in the last 168 hours. No results for input(s): AMMONIA in the last 168 hours. Coagulation Profile: Recent Labs  Lab 11/02/24 0934  INR 1.0   Cardiac Enzymes: Recent Labs  Lab 11/02/24 0934 11/03/24 0514  CKTOTAL 468* 307*   BNP (last 3 results) No results for input(s): PROBNP in the last 8760 hours. HbA1C: No results for input(s): HGBA1C in the last 72 hours. CBG: No results for input(s): GLUCAP in the last 168 hours. Lipid Profile: No results for input(s): CHOL, HDL, LDLCALC, TRIG, CHOLHDL, LDLDIRECT in the last 72 hours. Thyroid Function Tests: Recent Labs    11/02/24 0934 11/02/24 1706  TSH  --  67.348*  T4TOTAL <0.4*  --    Anemia Panel: Recent Labs    11/03/24 1728  VITAMINB12 1,150*  FOLATE 5.1*  TIBC 290  IRON 76   Sepsis Labs: No results for input(s): PROCALCITON, LATICACIDVEN in the last 168 hours.  No results found for this or any previous visit (from the past 240 hours).       Radiology Studies: No results found.      Scheduled Meds:  azelastine  1 spray Each Nare BID   baclofen  10 mg Oral BID   cycloSPORINE  1 drop Both Eyes BID    enoxaparin (LOVENOX) injection  40 mg Subcutaneous Q24H   escitalopram  20 mg Oral Daily   feeding supplement  237 mL Oral BID BM   fluticasone  2 spray Each Nare BID   folic acid  1 mg Oral Daily   levothyroxine  100 mcg Oral Q0600   mirabegron ER  50 mg Oral Daily   pantoprazole  40 mg Oral Daily   pilocarpine  5 mg Oral BID   pregabalin   100 mg Oral TID   rOPINIRole  1 mg Oral QHS   Continuous Infusions:   LOS: 0 days      Dawit Tankard Al-Sultani, MD [facility]   If 7PM-7AM, please contact night-coverage

## 2024-11-05 NOTE — Progress Notes (Addendum)
 Occupational Therapy Treatment Patient Details Name: Sandy Byrd MRN: [id number] DOB: Jun 29, 1955 Today's Date: 11/05/2024   History of present illness Pt is a 69 y.o. female presented with worsening generalized weakness, diarrhea and a fall at home. Current MD assessment: hypokalemia, mechanical fall, and acute diarrhea. 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   OT comments  Chart reviewed to date, pt greeted in chair, oriented to self and place. She requires increased time for one step directions and cueing for sequencing throughout tx session. Tx session targeted improving functional activity tolerance in prep for ADL tasks. Pt continues to require MIN A for amb in room with RW including cueing for sequencing. She requires MIN-MOD A for STS attempts and supervision for seated grooming tasks. Pt is making progress towards goals, discharge recommendation remains appropriate. OT will follow.   Of note, she does not report dizziness throughout       If plan is discharge home, recommend the following:  A little help with walking and/or transfers;A lot of help with bathing/dressing/bathroom;A little help with bathing/dressing/bathroom;Help with stairs or ramp for entrance;Assist for transportation;Assistance with cooking/housework   Equipment Recommendations  Other (comment) (defer to next venue of care)    Recommendations for Other Services      Precautions / Restrictions Precautions Precautions: Fall Recall of Precautions/Restrictions: Impaired Restrictions Weight Bearing Restrictions Per Provider Order: No       Mobility Bed Mobility               General bed mobility comments: NT in recliner pre/post session    Transfers Overall transfer level: Needs assistance Equipment used: Rolling walker (2 wheels) Transfers: Sit to/from Stand Sit to Stand: Min assist           General  transfer comment: step by step multi modal cues to power up     Balance Overall balance assessment: Needs assistance Sitting-balance support: Feet supported Sitting balance-Leahy Scale: Fair     Standing balance support: Bilateral upper extremity supported, Reliant on assistive device for balance Standing balance-Leahy Scale: Poor                             ADL either performed or assessed with clinical judgement   ADL Overall ADL's : Needs assistance/impaired     Grooming: Wash/dry face;Sitting;Supervision/safety               Lower Body Dressing: Maximal assistance;Sitting/lateral leans;Cueing for sequencing   Toilet Transfer: Minimal assistance;Rolling walker (2 wheels);Ambulation;Cueing for safety;Cueing for sequencing Toilet Transfer Details (indicate cue type and reason): simulated, slow shuffled steps         Functional mobility during ADLs: Minimal assistance;Cueing for safety;Cueing for sequencing;Rolling walker (2 wheels) (approx 15' two attempts, step by step mutli modal cues for RW technique; pt with shuffled steps)      Extremity/Trunk Assessment              Vision Patient Visual Report: No change from baseline     Perception     Praxis     Communication Communication Communication: No apparent difficulties   Cognition Arousal: Alert Behavior During Therapy: WFL for tasks assessed/performed Cognition: Cognition impaired   Orientation impairments: Situation, Time   Memory impairment (select all impairments): Declarative long-term memory Attention impairment (select first level of impairment): Sustained attention Executive functioning impairment (select all impairments): Reasoning, Problem solving  Following commands: Impaired Following commands impaired: Follows one step commands with increased time      Cueing   Cueing Techniques: Verbal cues, Gestural cues, Tactile cues, Visual cues  Exercises  Other Exercises Other Exercises: edu re role of OT, role of rehab    Shoulder Instructions       General Comments vss    Pertinent Vitals/ Pain       Pain Assessment Pain Assessment: No/denies pain  Home Living                                          Prior Functioning/Environment              Frequency  Min 2X/week        Progress Toward Goals  OT Goals(current goals can now be found in the care plan section)  Progress towards OT goals: Progressing toward goals  Acute Rehab OT Goals Time For Goal Achievement: 11/17/24  Plan      Co-evaluation                 AM-PAC OT 6 Clicks Daily Activity     Outcome Measure   Help from another person eating meals?: A Little Help from another person taking care of personal grooming?: A Little Help from another person toileting, which includes using toliet, bedpan, or urinal?: A Lot Help from another person bathing (including washing, rinsing, drying)?: A Lot Help from another person to put on and taking off regular upper body clothing?: A Lot Help from another person to put on and taking off regular lower body clothing?: A Lot 6 Click Score: 14    End of Session Equipment Utilized During Treatment: Gait belt;Rolling walker (2 wheels)  OT Visit Diagnosis: Other abnormalities of gait and mobility (R26.89);Muscle weakness (generalized) (M62.81)   Activity Tolerance Patient tolerated treatment well   Patient Left in chair;with call bell/phone within reach;with chair alarm set   Nurse Communication Mobility status        Time: 8872-8860 OT Time Calculation (min): 12 min  Charges: OT General Charges $OT Visit: 1 Visit OT Treatments $Therapeutic Activity: 8-22 mins  Therisa Sheffield, OTD OTR/L  11/05/24, 1:11 PM

## 2024-11-05 NOTE — TOC Progression Note (Addendum)
 Transition of Care [facility]) - Progression Note    Patient Details  Name: Sandy Byrd MRN: [id number] Date of Birth: 1955-09-28  Transition of Care [facility]) CM/SW Contact  Daved JONETTA Hamilton, RN Phone Number: 11/05/2024, 2:47 PM  Clinical Narrative:     [name]: [id number] Dates:11/5-11/06/2024 Next Review Date: 11/06/2024  PASRR obtained, DONALD Number: [id number]  FL2 has been signed   Expected Discharge Plan: Skilled Nursing Facility Barriers to Discharge: Continued Medical Work up               Expected Discharge Plan and Services   Discharge Planning Services: CM Consult Post Acute Care Choice: Skilled Nursing Facility Living arrangements for the past 2 months: Single Family Home                                       Social Drivers of Health (SDOH) Interventions SDOH Screenings   Food Insecurity: No Food Insecurity (11/02/2024)  Housing: Low Risk  (11/02/2024)  Transportation Needs: No Transportation Needs (11/02/2024)  Utilities: Not At Risk (11/02/2024)  Depression (PHQ2-9): Low Risk  (09/21/2024)  Recent Concern: Depression (PHQ2-9) - Medium Risk (08/11/2024)  Financial Resource Strain: Low Risk  (10/29/2024)   Received from [facility]  Social Connections: Socially Isolated (11/02/2024)  Tobacco Use: Medium Risk (11/02/2024)    Readmission Risk Interventions     No data to display

## 2024-11-05 NOTE — Progress Notes (Signed)
 Physical Therapy Treatment Patient Details Name: Sandy Byrd MRN: [id number] DOB: Sep 06, 1955 Today's Date: 11/05/2024   History of Present Illness Pt is a 69 y.o. female presented with worsening generalized weakness, diarrhea and a fall at home. Current MD assessment: hypokalemia, mechanical fall, and acute diarrhea. 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    PT Comments  Upon arrival to room patient reports she is trying to straighten out pads under her. She reports fatigue as she just got bath, but is willing to get up to recliner with encouragement. Patient requires mod A for bed mobility and getting square and balance at edge of bed. She is able to stand with RW and min A. Step pivoted to recliner with increased time and max cues for sequencing, walker use and step length. She has quite a lot of difficulty moving feet well. Patient will continue to benefit from skilled PT to improve strength and functional independence.    If plan is discharge home, recommend the following: Assistance with cooking/housework;Assist for transportation;A lot of help with bathing/dressing/bathroom;A lot of help with walking and/or transfers;Direct supervision/assist for financial management;Direct supervision/assist for medications management;Supervision due to cognitive status   Can travel by private vehicle     No  Equipment Recommendations  Rolling walker (2 wheels)    Recommendations for Other Services       Precautions / Restrictions Precautions Precautions: Fall Recall of Precautions/Restrictions: Intact Restrictions Weight Bearing Restrictions Per Provider Order: No     Mobility  Bed Mobility Overal bed mobility: Needs Assistance Bed Mobility: Supine to Sit     Supine to sit: Mod assist          Transfers Overall transfer level: Needs assistance Equipment used: Rolling walker (2 wheels) Transfers:  Sit to/from Stand Sit to Stand: Min assist   Step pivot transfers: Min assist       General transfer comment: patient requires assist to power up to full standing.    Ambulation/Gait               General Gait Details: patient was not up for ambulation at this time after receiving bath. May walk later.  Just got up to recliner   Stairs             Wheelchair Mobility     Tilt Bed    Modified Rankin (Stroke Patients Only)       Balance Overall balance assessment: Needs assistance Sitting-balance support: Feet supported Sitting balance-Leahy Scale: Fair Sitting balance - Comments: some right leaning in sitting and assistance to get step on side of bed Postural control: Right lateral lean   Standing balance-Leahy Scale: Poor Standing balance comment: requires external support in standing                            Communication Communication Communication: No apparent difficulties  Cognition Arousal: Alert Behavior During Therapy: WFL for tasks assessed/performed   PT - Cognitive impairments: No apparent impairments                       PT - Cognition Comments: slow to respond Following commands: Intact      Cueing Cueing Techniques: Verbal cues, Gestural cues  Exercises      General Comments        Pertinent Vitals/Pain Pain Assessment Pain Assessment: No/denies pain    Home Living  Prior Function            PT Goals (current goals can now be found in the care plan section) Acute Rehab PT Goals Patient Stated Goal: Improved strength PT Goal Formulation: With patient Time For Goal Achievement: 11/16/24 Potential to Achieve Goals: Fair Progress towards PT goals: Progressing toward goals    Frequency    Min 2X/week      PT Plan      Co-evaluation              AM-PAC PT 6 Clicks Mobility   Outcome Measure  Help needed turning from your back to your side  while in a flat bed without using bedrails?: A Lot Help needed moving from lying on your back to sitting on the side of a flat bed without using bedrails?: A Lot Help needed moving to and from a bed to a chair (including a wheelchair)?: A Lot Help needed standing up from a chair using your arms (e.g., wheelchair or bedside chair)?: A Lot Help needed to walk in hospital room?: A Lot Help needed climbing 3-5 steps with a railing? : A Lot 6 Click Score: 12    End of Session   Activity Tolerance: Patient tolerated treatment well Patient left: in chair;with call bell/phone within reach;with chair alarm set Nurse Communication: Mobility status PT Visit Diagnosis: Unsteadiness on feet (R26.81);History of falling (Z91.81);Difficulty in walking, not elsewhere classified (R26.2);Muscle weakness (generalized) (M62.81)     Time: 8953-8944 PT Time Calculation (min) (ACUTE ONLY): 9 min  Charges:    $Therapeutic Activity: 8-22 mins PT General Charges $$ ACUTE PT VISIT: 1 Visit                     Labron Bloodgood, PT, GCS 11/05/24,11:17 AM

## 2024-11-05 NOTE — Hospital Course (Signed)
 Sandy Byrd is a 69 year old with history of cognitive impairment, hypertension, hyperlipidemia, chronic ambulation impairment using her rollator, anxiety, depression, hypothyroidism, who presents with generalized weakness, persistent area, and a new fall at home. Patient had a sinus infection over a year ago for which she took clindamycin.  She reports since that time she has had chronic diarrhea.  She has not been evaluated for this outpatient.  She reports recently the diarrhea has become so severe she has stopped eating to avoid the consequences of diarrhea. Her POA, Carrin, reports that the patient has had worsening cognitive decline over the last many weeks.  The patient is no longer taking her medications, and is no longer eating food even when it is prepared for her.  Prior to arrival the patient suffered a fall at home and was not answering her phone which prompted her friend, Jaclyne, to go and check on her. On arrival to the ED she was found to be hypokalemic to 2.4.  Pan scan unremarkable.  She was admitted for workup

## 2024-11-06 DIAGNOSIS — R531 Weakness: Secondary | ICD-10-CM

## 2024-11-06 DIAGNOSIS — R197 Diarrhea, unspecified: Secondary | ICD-10-CM | POA: Insufficient documentation

## 2024-11-06 DIAGNOSIS — E039 Hypothyroidism, unspecified: Secondary | ICD-10-CM

## 2024-11-06 DIAGNOSIS — E538 Deficiency of other specified B group vitamins: Secondary | ICD-10-CM | POA: Insufficient documentation

## 2024-11-06 DIAGNOSIS — E876 Hypokalemia: Secondary | ICD-10-CM | POA: Diagnosis not present

## 2024-11-06 DIAGNOSIS — R001 Bradycardia, unspecified: Secondary | ICD-10-CM

## 2024-11-06 DIAGNOSIS — F0390 Unspecified dementia without behavioral disturbance: Secondary | ICD-10-CM | POA: Insufficient documentation

## 2024-11-06 DIAGNOSIS — W19XXXA Unspecified fall, initial encounter: Secondary | ICD-10-CM

## 2024-11-06 LAB — COMPREHENSIVE METABOLIC PANEL WITH GFR
ALT: 17 U/L (ref 0–44)
AST: 32 U/L (ref 15–41)
Albumin: 3.7 g/dL (ref 3.5–5.0)
Alkaline Phosphatase: 52 U/L (ref 38–126)
Anion gap: 16 — ABNORMAL HIGH (ref 5–15)
BUN: 9 mg/dL (ref 8–23)
CO2: 31 mmol/L (ref 22–32)
Calcium: 8.6 mg/dL — ABNORMAL LOW (ref 8.9–10.3)
Chloride: 88 mmol/L — ABNORMAL LOW (ref 98–111)
Creatinine, Ser: 0.9 mg/dL (ref 0.44–1.00)
GFR, Estimated: >60 mL/min (ref >60)
Glucose, Bld: 95 mg/dL (ref 70–99)
Potassium: 3.5 mmol/L (ref 3.5–5.1)
Sodium: 135 mmol/L (ref 135–145)
Total Bilirubin: 1.7 mg/dL — ABNORMAL HIGH (ref 0.0–1.2)
Total Protein: 7.2 g/dL (ref 6.5–8.1)

## 2024-11-06 LAB — CBC WITH DIFFERENTIAL/PLATELET
Abs Immature Granulocytes: 0.07 K/uL (ref 0.00–0.07)
Basophils Absolute: 0.1 K/uL (ref 0.0–0.1)
Basophils Relative: 1 %
Eosinophils Absolute: 0 K/uL (ref 0.0–0.5)
Eosinophils Relative: 1 %
HCT: 36.1 % (ref 36.0–46.0)
Hemoglobin: 12 g/dL (ref 12.0–15.0)
Immature Granulocytes: 1 %
Lymphocytes Relative: 27 %
Lymphs Abs: 1.5 K/uL (ref 0.7–4.0)
MCH: 34 pg (ref 26.0–34.0)
MCHC: 33.2 g/dL (ref 30.0–36.0)
MCV: 102.3 fL — ABNORMAL HIGH (ref 80.0–100.0)
Monocytes Absolute: 0.4 K/uL (ref 0.1–1.0)
Monocytes Relative: 8 %
Neutro Abs: 3.5 K/uL (ref 1.7–7.7)
Neutrophils Relative %: 62 %
Platelets: 143 K/uL — ABNORMAL LOW (ref 150–400)
RBC: 3.53 MIL/uL — ABNORMAL LOW (ref 3.87–5.11)
RDW: 14.9 % (ref 11.5–15.5)
WBC: 5.6 K/uL (ref 4.0–10.5)
nRBC: 0.5 % — ABNORMAL HIGH (ref 0.0–0.2)

## 2024-11-06 LAB — PHOSPHORUS: Phosphorus: 3.1 mg/dL (ref 2.5–4.6)

## 2024-11-06 LAB — MAGNESIUM: Magnesium: 2.2 mg/dL (ref 1.7–2.4)

## 2024-11-06 MED ORDER — ENSURE PLUS HIGH PROTEIN PO LIQD: 237.0000 mL | Freq: Two times a day (BID) | ORAL | Status: AC

## 2024-11-06 MED ORDER — POLYETHYLENE GLYCOL 3350 17 G PO PACK: 17.0000 g | PACK | Freq: Every day | ORAL | Status: AC

## 2024-11-06 MED ORDER — SENNOSIDES-DOCUSATE SODIUM 8.6-50 MG PO TABS
1.0000 | ORAL_TABLET | Freq: Once | ORAL | Status: AC
  Administered 2024-11-06: 1 via ORAL
  Filled 2024-11-06: qty 1

## 2024-11-06 MED ORDER — POTASSIUM CHLORIDE CRYS ER 20 MEQ PO TBCR: 20.0000 meq | EXTENDED_RELEASE_TABLET | Freq: Every day | ORAL | Status: AC

## 2024-11-06 MED ORDER — HYDROCODONE-ACETAMINOPHEN 7.5-325 MG PO TABS: 1.0000 | ORAL_TABLET | Freq: Three times a day (TID) | ORAL | 0 refills | Status: AC | PRN

## 2024-11-06 MED ORDER — POLYETHYLENE GLYCOL 3350 17 G PO PACK
17.0000 g | PACK | Freq: Every day | ORAL | Status: DC
  Administered 2024-11-06: 17 g via ORAL
  Filled 2024-11-06: qty 1

## 2024-11-06 NOTE — TOC Transition Note (Signed)
 Transition of Care [facility]) - Discharge Note   Patient Details  Name: Sandy Byrd MRN: [id number] Date of Birth: [birth year]  Transition of Care [facility]) CM/SW Contact:  Daved JONETTA Hamilton, RN Phone Number: 11/06/2024, 11:57 AM   Clinical Narrative:     Patient will DC to: [facility] Anticipated DC date: 11/06/24 Family notified: Left v/m for [facility]CP[facility] Transport by: Zona  Per MD patient ready for DC to [facility]. RN, patient, patient's family, and facility notified of DC. Discharge Summary sent to facility. RN given number for report. DC packet on chart. Ambulance transport requested for patient.   TOC signing off.   Final next level of care: Skilled Nursing Facility Barriers to Discharge: Barriers Resolved   Patient Goals and CMS Choice     Choice offered to / list presented to : [facility] POA / Guardian      Discharge Placement              Patient chooses bed at: [facility] [place] Patient to be transferred to facility by: [facility] Name of family member notified: Marchelle Rinella [facility] had to leave v/m to call me back Patient and family notified of of transfer: 11/06/24  Discharge Plan and Services Additional resources added to the After Visit Summary for     Discharge Planning Services: CM Consult Post Acute Care Choice: Skilled Nursing Facility          DME Arranged: Vannie rolling DME Agency: [employer] Date DME Agency Contacted: 11/06/24 Time DME Agency Contacted: [phone] Representative spoke with at DME Agency: Thomasina            Social Drivers of Health (SDOH) Interventions SDOH Screenings   Food Insecurity: No Food Insecurity (11/02/2024)  Housing: Low Risk  (11/02/2024)  Transportation Needs: No Transportation Needs (11/02/2024)  Utilities: Not At Risk (11/02/2024)  Depression (PHQ2-9): Low Risk  (09/21/2024)  Recent Concern: Depression (PHQ2-9) - Medium Risk (08/11/2024)  Financial Resource Strain: Low Risk  (10/29/2024)   Received  from [facility]  Social Connections: Socially Isolated (11/02/2024)  Tobacco Use: Medium Risk (11/02/2024)     Readmission Risk Interventions     No data to display

## 2024-11-06 NOTE — Plan of Care (Signed)
  Problem: Clinical Measurements: Goal: Ability to maintain clinical measurements within normal limits will improve Outcome: Progressing Goal: Will remain free from infection Outcome: Progressing Goal: Diagnostic test results will improve Outcome: Progressing   Problem: Activity: Goal: Risk for activity intolerance will decrease Outcome: Progressing   Problem: Coping: Goal: Level of anxiety will decrease Outcome: Progressing   

## 2024-11-06 NOTE — Discharge Summary (Signed)
 Physician Discharge Summary   Patient: Sandy Byrd MRN: [id number] DOB: [birth year]  Admit date:     11/02/2024  Discharge date: 11/06/24  Discharge Physician: Duffy Al-Sultani   PCP: Sherial Bail, MD   Recommendations at discharge:   Follow up with PCP or SNF MD to repeat lab work including K and Phos levels, continued monitoring of TSH and repeat in 6-8 weeks, continued monitoring of folate deficiency, and trend total bilirubin levels to ensure complete resolution Follow up with PCP for continued monitoring and further evaluation of sinus bradycardia  Discharge Diagnoses: Principal Problem:   Hypokalemia Active Problems:   Generalized weakness   Hypothyroidism   Sinus bradycardia   Fall   Chronic pain syndrome   Diarrhea   Dementia without behavioral disturbance (HCC)   Folate deficiency   Hypophosphatemia  Resolved Problems:   * No resolved hospital problems. [facility] Course: The patient is a 69 year old female with PMHx of cognitive impairment, HTN, HLD, chronic ambulation impairment utilizing rollator, anxiety, depression, hypothyroidism, who presents with generalized weakness, diarrhea, and fall at home.  Patient reportedly began to have 3-5 episodes of watery diarrhea over the 4 to 5 days preceding her presentation, not associated with abdominal pain, nausea, vomiting, fevers or chills.  2 days prior to presentation she noted significant weakness though she normally uses a rolling walker daily, and suffered a fall while standing up from bed, with no injury to the head or loss of consciousness noted.  In the ED, she was afebrile with blood pressure of 117/82 and SpO2 100% on RA.  CT head, C-spine, L-spine were negative for acute traumatic injury.  Chest x-ray was unremarkable.  X-ray of pelvis was unremarkable.  CBC was unremarkable.  CMP was significant for potassium of 2.6, creatinine 1.12, T. bili 4.2, anion gap 24.  Direct bili 1.3.  CK was elevated to 468.  TSH  67, total T4 <0.4.  Magnesium 3.2.  Phos 2.2.  Mechanical fall Generalized weakness - Has had progressive weakness.  Likely complicated by severe hypokalemia and uncontrolled hypothyroidism - No evidence of acute trauma or fractures on all radiographic studies, as listed above.  - PT/OT evaluated patient and recommended SNF  Hypokalemia - resolved - K 2.6 on admission - Likely secondary to diarrhea - Repleted - K 3.5 at the time of discharge - Prescribed PO KCL 20 mEq daily for 5 days at discharge - Recommend repeat lab work within 3 days of discharge to ensure appropriate K levels   Hypophosphatemia - Phos 2.2 on admission - Repleted  - Phos 3.1 at the time of discharge - Recommend repeat lab work within 3 days of discharge to ensure appropriate phosphorus levels   Acute on Chronic diarrhea - Patient reports that she has had diarrhea over the last year after 1 round of clindamycin for sinus infection. Though appears to have acutely worsened a few days prior to presentation, and was reportedly taking Imodium for symptom management  - Diarrhea resolved with no episodes during hospitalization  - GI pathogen panel and C. difficile testing were ordered but not collected - Patient not started on bowel regimen on admission due to complaints of acute worsening diarrhea. However, she did not have BM for 3 days. Was started on Miralax.   Hypertension - Home antihypertensives held on admission due to hypokalemia and low-normal BPs  - Home Lasix and Triamterene-HCTZ were held at the time of discharge due to hypokalemia and low-normal blood pressures. Follow up with primary  care provider. Resumption should be deferred until blood pressure stabilizes or increases and potassium levels normalize  Hypothyroidism - Patient meant to be on Synthroid but does not take it.  TSH 67 - Continue Synthroid at 100 mcg for now.  Will avoid further medication changes as patient has not actually been taking  medication at home - Patient will need to follow-up with PCP for continued management and repeat TSH/free T4 levels   Sinus Bradycardia - Asymptomatic, blood pressure stable - Suspect possibly secondary to underlying uncontrolled hypothyroidism and electrolyte abnormalities - Not on any AV nodal blocking agents - Recommend follow-up with PCP for further monitoring and evaluation  Hyperbilirubinemia - Right upper quadrant ultrasound unremarkable.  Patient without gallbladder - LFTs mostly unremarkable.  Elevation may be secondary to CK elevation. - Hepatitis panel negative - HIV negative - No nausea/vomiting/pain - Improved to 1.7 at time of discharge, down from 4.2 on admission - Recommend follow-up with PCP/SNF MD for continued monitoring and downtrending to ensure complete resolution   Dementia - At baseline patient is ambulatory and independent for some ADLs.  Her friend and POA, [name], reports that she has been progressively declining.  Was recently seen by neurology Dr. Maree who felt that repeat brain MRI was of little utility.  She has known chronic small vessel disease and brain atrophy - Patient is currently in the process of building a house closer to her POA with the objective to continue to live independently. - She appears very deconditioned at this time and would benefit from short rehab stay before returning home with home health.   Chronic pain syndrome Lumbar degenerative disc disease Peripheral neuropathy - Patient is on Celebrex, Lyrica , Norco, baclofen at home. - Was managed with scheduled baclofen and Lyrica  and PRN Norco while hospitalized - Home Celebrex resumed at discharge   Folate deficiency - MCV elevated.  Hemoglobin WNL. - Fe panel wnl - B12 elevated - Folate low 5.1  - Continue folate 1 mg daily   Consultants: None Procedures performed: None  Disposition: Skilled nursing facility Diet recommendation:  Diet Orders (From admission, onward)      Start     Ordered   11/06/24 0000  Diet general        11/06/24 1043   11/03/24 0731  Diet regular Fluid consistency: Thin  Diet effective now       Question:  Fluid consistency:  Answer:  Thin   11/03/24 0730            DISCHARGE MEDICATION: Allergies as of 11/06/2024       Reactions   Cephalexin Hives, Swelling   Cephalosporins Hives, Swelling   Prochlorperazine Hives, Swelling   Shrimp Extract Anaphylaxis   Bee Venom Hives   Wasps sting   Latex Other (See Comments)   Skin breaks out really bad   Other Other (See Comments)   Cannot do plastic tape        Medication List     PAUSE taking these medications    furosemide 40 MG tablet Wait to take this until your doctor or other care provider tells you to start again. Lasix was held at discharge due to low serum potassium levels. Resumption of Lasix can be considered once potassium has normalized. Commonly known as: LASIX Take by mouth.   triamterene-hydrochlorothiazide 37.5-25 MG capsule Wait to take this until your doctor or other care provider tells you to start again. Triamterene-HCTZ was held at the time of discharge due to hypokalemia  and low-normal blood pressures. Follow up with primary care provider. Resumption should be deferred until blood pressure stabilizes or increases and potassium levels normalize Commonly known as: DYAZIDE Take 1 capsule by mouth daily.       STOP taking these medications    metroNIDAZOLE 1 % gel Commonly known as: METROGEL   Turmeric 500 MG Caps   zolpidem 10 MG tablet Commonly known as: AMBIEN       TAKE these medications    acetaminophen  500 MG tablet Commonly known as: TYLENOL  Take by mouth.   ascorbic acid 500 MG tablet Commonly known as: VITAMIN C Take by mouth.   azelastine 0.1 % nasal spray Commonly known as: ASTELIN USE 1 TO 2 SPRAY(S) IN EACH NOSTRIL TWICE DAILY   baclofen 10 MG tablet Commonly known as: LIORESAL Take 10 mg by mouth 2 (two)  times daily.   celecoxib 200 MG capsule Commonly known as: CELEBREX Take 200 mg by mouth 2 (two) times daily.   cyanocobalamin 1000 MCG tablet Commonly known as: VITAMIN B12 Take 1,000 mcg by mouth daily.   cycloSPORINE 0.05 % ophthalmic emulsion Commonly known as: RESTASIS Place 1 drop into both eyes 2 (two) times daily.   EPINEPHrine 0.3 mg/0.3 mL Soaj injection Commonly known as: EPI-PEN SMARTSIG:1 Pre-Filled Pen Syringe IM PRN   ergocalciferol 1.25 MG (50000 UT) capsule Commonly known as: VITAMIN D2 Take 50,000 Units by mouth once a week.   escitalopram 20 MG tablet Commonly known as: LEXAPRO Take by mouth.   feeding supplement Liqd Take 237 mLs by mouth 2 (two) times daily between meals.   fluticasone 50 MCG/ACT nasal spray Commonly known as: FLONASE Place 2 sprays into both nostrils 2 (two) times daily.   folic acid 1 MG tablet Commonly known as: FOLVITE Take by mouth.   HYDROcodone -acetaminophen  7.5-325 MG tablet Commonly known as: NORCO Take 1 tablet by mouth every 8 (eight) hours as needed for up to 7 days. What changed: Another medication with the same name was removed. Continue taking this medication, and follow the directions you see here.   ketoconazole 2 % shampoo Commonly known as: NIZORAL Apply 1 Application topically as needed.   levothyroxine 100 MCG tablet Commonly known as: SYNTHROID Take 100 mcg by mouth daily.   lidocaine  5 % Commonly known as: Lidoderm  Place 1 patch onto the skin every 12 (twelve) hours. Remove & Discard patch within 12 hours or as directed by MD   Magnesium 500 MG Caps Take 500 mg by mouth daily.   Myrbetriq 50 MG Tb24 tablet Generic drug: mirabegron ER Take 50 mg by mouth daily.   omeprazole 40 MG capsule Commonly known as: PRILOSEC Take 40 mg by mouth at bedtime.   pilocarpine 5 MG tablet Commonly known as: SALAGEN Take by mouth.   polyethylene glycol 17 g packet Commonly known as: MIRALAX /  GLYCOLAX Take 17 g by mouth daily for 5 days.   potassium chloride SA 20 MEQ tablet Commonly known as: KLOR-CON M Take 1 tablet (20 mEq total) by mouth daily for 5 days. What changed:  how much to take when to take this   pregabalin  100 MG capsule Commonly known as: Lyrica  Take 1 capsule (100 mg total) by mouth 3 (three) times daily.   rOPINIRole 1 MG tablet Commonly known as: REQUIP Take 1 mg by mouth at bedtime.   simvastatin 40 MG tablet Commonly known as: ZOCOR Take 1 tablet by mouth at bedtime.   triamcinolone cream 0.1 %  Commonly known as: KENALOG Apply topically 2 (two) times daily as needed.   Ventolin HFA 108 (90 Base) MCG/ACT inhaler Generic drug: albuterol SMARTSIG:2 Puff(s) By Mouth Every 4 Hours PRN        Contact information for after-discharge care     Destination     [facility]   Service: Skilled Nursing Contact information: [address] [place]  [address] [phone]                    Discharge Exam: Fredricka Weights   11/02/24 0932  Weight: 89.4 kg   General exam: Appears calm and comfortable  Respiratory system: Clear to auscultation. Respiratory effort normal. Cardiovascular system: S1 & S2 heard, bradycardic, regular rhythm. No pedal edema. Gastrointestinal system: Abdomen is nondistended, soft and nontender. No organomegaly or masses felt. Normal bowel sounds heard. Central nervous system: Alert and oriented. No focal neurological deficits. Extremities: Symmetric, normal ROM Skin: Ecchymoses noted on chest at site of cardiac monitoring stickers, left flank, bilateral upper extremities Psychiatry: Judgement and insight appear limited. Mood & affect appropriate.   Condition at discharge: fair  The results of significant diagnostics from this hospitalization (including imaging, microbiology, ancillary and laboratory) are listed below for reference.   Imaging Studies: US  ABDOMEN LIMITED RUQ  (LIVER/GB) Result Date: 11/02/2024 CLINICAL DATA:  73059 Hyperbilirubinemia 26940 EXAM: ULTRASOUND ABDOMEN LIMITED RIGHT UPPER QUADRANT COMPARISON:  12/13/2022 FINDINGS: Gallbladder: Cholecystectomy. Common bile duct: Diameter: 4 mm Liver: Increased echogenicity. No focal lesion identified. No intrahepatic biliary ductal dilation. Portal vein is patent on color Doppler imaging with normal direction of blood flow towards the liver. Right Kidney: Partially visualized. No mass. No hydronephrosis or nephrolithiasis. Other: None. IMPRESSION: 1. Cholecystectomy. No intrahepatic or extrahepatic biliary ductal dilation. 2. Hepatic steatosis. Electronically Signed   By: Rogelia Myers M.D.   On: 11/02/2024 13:19   DG Pelvis Portable Result Date: 11/02/2024 CLINICAL DATA:  Status post fall EXAM: PORTABLE PELVIS 1 VIEWS COMPARISON:  None Available. FINDINGS: There is no evidence of pelvic fracture or diastasis. No pelvic bone lesions are seen. IMPRESSION: No acute fracture or dislocation. Electronically Signed   By: Limin  Xu M.D.   On: 11/02/2024 12:06   DG Chest Portable 1 View Result Date: 11/02/2024 CLINICAL DATA:  Status post fall EXAM: PORTABLE CHEST 1 VIEW COMPARISON:  None Available. FINDINGS: Normal lung volumes. No focal consolidations. No pleural effusion or pneumothorax. The heart size and mediastinal contours are within normal limits. No radiographic finding of acute displaced fracture. IMPRESSION: 1. No radiographic finding of acute displaced fracture. 2.  No focal consolidations. Electronically Signed   By: Limin  Xu M.D.   On: 11/02/2024 12:04   CT Lumbar Spine Wo Contrast Result Date: 11/02/2024 EXAM: CT OF THE LUMBAR SPINE WITHOUT CONTRAST 11/02/2024 10:24:52 AM TECHNIQUE: CT of the lumbar spine was performed without the administration of intravenous contrast. Multiplanar reformatted images are provided for review. Automated exposure control, iterative reconstruction, and/or weight based adjustment  of the mA/kV was utilized to reduce the radiation dose to as low as reasonably achievable. COMPARISON: Lumbar MRI 03/05/last year. CLINICAL HISTORY: 69 year old female. Trauma. FINDINGS: BONES AND ALIGNMENT: Normal lumbar segmentation. Chronic straightening of lumbar lordosis. Subtle chronic anterolisthesis of L3 on L4 is stable. Prior cholecystectomy. Chronic appearing and unhealed right L3 transverse process fracture (series 5 image 68). Generalized osteopenia which seems mildly advanced for age. Intact visible sacrum and SI joints. No acute osseous abnormality. No acute traumatic  injury identified in the lumbar spine. SOFT TISSUES: Calcified aortoiliac atherosclerosis. Normal caliber abdominal aorta. Partially visible diverticulosis of the large bowel. Negative lumbar paraspinal soft tissues. DEGENERATIVE CHANGES: Chronic lumbar spine degeneration, similar to the MRI last year. Degenerative vacuum disc has progressed at both L1-L2 and L4-L5. Chronic grade 1 anterolisthesis at L3-L4 associated with chronic disc bulging and moderate facet arthropathy. 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 At L5-S1, right foraminal disc or disc osteophyte complex seems progressed from last year on series 6 image 107. Query right L5 radiculitis. IMPRESSION: 1. No acute traumatic injury identified in the lumbar spine. Chronic right L3 transverse process fracture. 2. Chronic lumbar spine degeneration, similar to MRI on 03/05/2023, but suspicion of progressed right foraminal disc or disc osteophyte complex at L5-S1 (series 6 image 107). Query right L5 radiculitis. Electronically signed by: Helayne Hurst MD 11/02/2024 10:57 AM EST RP Workstation: [id number]   CT Cervical Spine Wo Contrast Result Date: 11/02/2024 EXAM: CT CERVICAL SPINE WITHOUT CONTRAST 11/02/2024 10:24:52 AM TECHNIQUE: CT of the cervical spine was performed without the administration of intravenous contrast. Multiplanar reformatted  images are provided for review. Automated exposure control, iterative reconstruction, and/or weight based adjustment of the mA/kV was utilized to reduce the radiation dose to as low as reasonably achievable. COMPARISON: Head CT 11/02/2024 (reported separately). Cervical spine MRI 05/28/2023. CLINICAL HISTORY: 69 year old female. Trauma. FINDINGS: CERVICAL SPINE: BONES AND ALIGNMENT: Mild extra convex cervical scoliosis. Mild straightening of lordosis is stable. No acute fracture or traumatic malalignment. DEGENERATIVE CHANGES: Chronic cervical spine degeneration, including disc and endplate degeneration most pronounced at C5-C6, appears stable from the MRI 05/28/2023. SOFT TISSUES: No prevertebral soft tissue swelling. Bulky calcified atherosclerosis at the skull base. Bilateral carotid bifurcation calcified atherosclerosis. Negative visible non-contrast thoracic inlet. IMPRESSION: 1. No acute traumatic injury identified in the cervical spine. 2. Stable chronic cervical spine degeneration from MRI last year. Electronically signed by: Helayne Hurst MD 11/02/2024 10:51 AM EST RP Workstation: [id number]   CT HEAD WO CONTRAST ( ) Result Date: 11/02/2024 EXAM: CT HEAD WITHOUT CONTRAST 11/02/2024 10:24:52 AM TECHNIQUE: CT of the head was performed without the administration of intravenous contrast. Automated exposure control, iterative reconstruction, and/or weight based adjustment of the mA/kV was utilized to reduce the radiation dose to as low as reasonably achievable. COMPARISON: Brain MRI 05/30/2023. CLINICAL HISTORY: 69 year old female. Trauma. FINDINGS: BRAIN AND VENTRICLES: No acute hemorrhage. No evidence of acute infarct. No hydrocephalus. No extra-axial collection. No mass effect or midline shift. Stable brain volume with minimal limits for age bulky calcified atherosclerosis at the skull base. No suspicious intracranial vascular hyperdensity. Chronic cerebral white matter changes most apparent by CT near the  left frontal horns. Asymmetric left corona radiata involvement appears chronic and stable. 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 ORBITS: No acute abnormality. SINUSES: Stable mild right mastoid effusion. Elsewhere middle ears, mastoids, paranasal sinuses well aerated. SOFT TISSUES AND SKULL: No acute soft tissue abnormality. No skull fracture. IMPRESSION: 1. No acute traumatic injury identified. 2. Stable chronic small vessel disease from MRI last year. Electronically signed by: Helayne Hurst MD 11/02/2024 10:49 AM EST RP Workstation: [id number]    Microbiology: No results found for this or any previous visit.  Labs: CBC: Recent Labs  Lab 11/02/24 0934 11/03/24 0514 11/04/24 0555 11/05/24 0504 11/06/24 0625  WBC 7.9 6.9 4.8 5.7 5.6  NEUTROABS 5.9  --  3.3 3.5 3.5  HGB 13.8 12.1 11.2* 11.9* 12.0  HCT 40.0 35.9* 33.2* 35.9* 36.1  MCV 100.0 101.1* 102.2* 102.6* 102.3*  PLT 170 156 147* 159 143*   Basic Metabolic Panel: Recent Labs  Lab 11/02/24 0934 11/02/24 1255 11/02/24 1706 11/02/24 2229 11/03/24 0514 11/04/24 0555 11/05/24 0504 11/06/24 0625  NA 138  --   --   --  139 136 133* 135  K 2.6*  --    < > 3.2* 3.3* 3.3* 3.5 3.5  CL 84*  --   --   --  91* 91* 89* 88*  CO2 30  --   --   --  32 30 31 31   GLUCOSE 109*  --   --   --  91 92 96 95  BUN 18  --   --   --  15 11 10 9   CREATININE 1.12*  --   --   --  0.86 0.77 0.82 0.90  CALCIUM 9.0  --   --   --  8.4* 8.0* 8.2* 8.6*  MG  --  3.2*  --   --   --  2.6* 2.3 2.2  PHOS  --  2.2*  --   --  3.4 2.2* 2.2* 3.1   < > = values in this interval not displayed.   Liver Function Tests: Recent Labs  Lab 11/02/24 0934 11/03/24 0514 11/04/24 0555 11/05/24 0504 11/06/24 0625  AST 43* 40 33 36 32  ALT 19 19 18 18 17   ALKPHOS 50 44 40 49 52  BILITOT 4.2* 2.9* 2.2* 1.6* 1.7*  PROT 8.8* 7.5 7.1 7.5 7.2  ALBUMIN 4.6 3.9 3.5 3.7 3.7   CBG: No results for input(s): GLUCAP in the last 168 hours.  Discharge  time spent: 45 minutes  Signed: Cheng Dec Al-Sultani, MD [facility] 11/06/2024

## 2024-11-06 NOTE — Progress Notes (Signed)
 Report given to staff at [facility].

## 2024-11-06 NOTE — Progress Notes (Signed)
 Occupational Therapy Treatment Patient Details Name: Sandy Byrd MRN: [id number] DOB: 04/20/1955 Today's Date: 11/06/2024   History of present illness Pt is a 69 y.o. female presented with worsening generalized weakness, diarrhea and a fall at home. Current MD assessment: hypokalemia, mechanical fall, and acute diarrhea. 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   OT comments  Pt in bed upon OT arrival and agreeable to OT tx session.  Pt initially reporting no pain, though expressed low back pain with prolonged standing, noted towards end of brushing teeth while standing at sink.  Pt given tactile cues for attempts to correct forward flexed posture, but was unable to correct d/t focus on discomfort and fatigue after at least 5 min of standing for ADLs at sink.  Pt presents with slow processing of verbal commands, requiring tactile cues and increased time for motor planning.  Steps at bedside are slow and short, with pt requiring max vc/tc for side stepping toward HOB in prep for return to bed.  Pt requested return to bed rather than sitting up in chair.  Mod A for all bed mobility.  Pt reported increased comfort with pillow beneath knees and HOB slightly elevated.  All necessary items placed within reach.  Will continue to follow to work towards goals in OT poc.        If plan is discharge home, recommend the following:  A little help with walking and/or transfers;A lot of help with bathing/dressing/bathroom;A little help with bathing/dressing/bathroom;Help with stairs or ramp for entrance;Assist for transportation;Assistance with cooking/housework   Equipment Recommendations  Other (comment) (defer to next venue of care)    Recommendations for Other Services      Precautions / Restrictions Precautions Precautions: Fall Recall of Precautions/Restrictions: Impaired Restrictions Weight Bearing Restrictions Per  Provider Order: No       Mobility Bed Mobility Overal bed mobility: Needs Assistance Bed Mobility: Supine to Sit, Sit to Supine     Supine to sit: Mod assist Sit to supine: Mod assist   General bed mobility comments: max vc for sequencing Patient Response: Cooperative  Transfers Overall transfer level: Needs assistance Equipment used: Rolling walker (2 wheels) Transfers: Sit to/from Stand Sit to Stand: Min assist     Step pivot transfers: Min assist     General transfer comment: raised bed slightly to ease STS; vc/tc for hand placement in prep for safe transfer     Balance Overall balance assessment: Needs assistance Sitting-balance support: Feet supported Sitting balance-Leahy Scale: Fair     Standing balance support: Bilateral upper extremity supported, Reliant on assistive device for balance Standing balance-Leahy Scale: Poor Standing balance comment: CGA-min A for dynamic standing                           ADL either performed or assessed with clinical judgement   ADL Overall ADL's : Needs assistance/impaired     Grooming: Oral care;Contact guard assist;Supervision/safety;Standing Grooming Details (indicate cue type and reason): vc for positioning self closer to sink in standing for improved stability and reducing forward lean toward sink                             Functional mobility during ADLs: Minimal assistance;Cueing for safety;Cueing for sequencing;Rolling walker (2 wheels) General ADL Comments: Pt denied any dizziness upon sitting and standing    Extremity/Trunk Assessment Upper Extremity  Assessment Upper Extremity Assessment: Generalized weakness   Lower Extremity Assessment Lower Extremity Assessment: Generalized weakness        Vision Patient Visual Report: No change from baseline     Perception     Praxis     Communication Communication Communication: No apparent difficulties   Cognition Arousal:  Alert Behavior During Therapy: WFL for tasks assessed/performed Cognition: Cognition impaired   Orientation impairments: Situation, Time   Memory impairment (select all impairments): Declarative long-term memory Attention impairment (select first level of impairment): Sustained attention Executive functioning impairment (select all impairments): Reasoning, Problem solving                   Following commands: Impaired Following commands impaired: Follows one step commands with increased time      Cueing   Cueing Techniques: Verbal cues, Gestural cues, Tactile cues, Visual cues  Exercises             General Comments      Pertinent Vitals/ Pain       Pain Assessment Pain Assessment: 0-10 Pain Score: 6  Pain Location: low back Pain Descriptors / Indicators: Aching Pain Intervention(s): Limited activity within patient's tolerance, Monitored during session, Repositioned  Home Living                                          Prior Functioning/Environment              Frequency  Min 2X/week        Progress Toward Goals  OT Goals(current goals can now be found in the care plan section)  Progress towards OT goals: Progressing toward goals  Acute Rehab OT Goals Patient Stated Goal: improve strength OT Goal Formulation: With patient Time For Goal Achievement: 11/17/24 Potential to Achieve Goals: Good  Plan                       AM-PAC OT 6 Clicks Daily Activity     Outcome Measure   Help from another person eating meals?: A Little Help from another person taking care of personal grooming?: A Little Help from another person toileting, which includes using toliet, bedpan, or urinal?: A Lot Help from another person bathing (including washing, rinsing, drying)?: A Lot Help from another person to put on and taking off regular upper body clothing?: A Lot Help from another person to put on and taking off regular lower body  clothing?: A Lot 6 Click Score: 14    End of Session Equipment Utilized During Treatment: Gait belt;Rolling walker (2 wheels)  OT Visit Diagnosis: Other abnormalities of gait and mobility (R26.89);Muscle weakness (generalized) (M62.81)   Activity Tolerance Patient tolerated treatment well   Patient Left with bed alarm set;with call bell/phone within reach;in bed   Nurse Communication Mobility status        Time: 9165-9144 OT Time Calculation (min): 21 min  Charges: OT General Charges $OT Visit: 1 Visit OT Treatments $Self Care/Home Management : 8-22 mins  Inocente Blazing, MS, OTR/L  Inocente MARLA Blazing 11/06/2024, 10:51 AM

## 2024-12-17 ENCOUNTER — Encounter: Payer: Self-pay | Admitting: Nurse Practitioner

## 2024-12-21 ENCOUNTER — Encounter: Admitting: Nurse Practitioner

## 2025-01-05 DIAGNOSIS — Z79899 Other long term (current) drug therapy: Secondary | ICD-10-CM | POA: Insufficient documentation

## 2025-01-06 ENCOUNTER — Ambulatory Visit: Attending: Nurse Practitioner | Admitting: Nurse Practitioner

## 2025-01-06 ENCOUNTER — Encounter: Payer: Self-pay | Admitting: Nurse Practitioner

## 2025-01-06 VITALS — BP 104/63 | HR 73 | Temp 98.6°F | Resp 16 | Ht 67.0 in | Wt 120.0 lb

## 2025-01-06 DIAGNOSIS — G894 Chronic pain syndrome: Secondary | ICD-10-CM | POA: Insufficient documentation

## 2025-01-06 DIAGNOSIS — Z79899 Other long term (current) drug therapy: Secondary | ICD-10-CM | POA: Insufficient documentation

## 2025-01-06 DIAGNOSIS — M533 Sacrococcygeal disorders, not elsewhere classified: Secondary | ICD-10-CM | POA: Insufficient documentation

## 2025-01-06 DIAGNOSIS — M461 Sacroiliitis, not elsewhere classified: Secondary | ICD-10-CM | POA: Insufficient documentation

## 2025-01-06 DIAGNOSIS — G8929 Other chronic pain: Secondary | ICD-10-CM | POA: Diagnosis present

## 2025-01-06 DIAGNOSIS — M7918 Myalgia, other site: Secondary | ICD-10-CM | POA: Diagnosis present

## 2025-01-06 DIAGNOSIS — M25561 Pain in right knee: Secondary | ICD-10-CM | POA: Insufficient documentation

## 2025-01-06 MED ORDER — PREGABALIN 100 MG PO CAPS: 100.0000 mg | ORAL_CAPSULE | Freq: Three times a day (TID) | ORAL | 5 refills | Status: DC

## 2025-01-06 NOTE — Progress Notes (Signed)
 Nursing Pain Medication Assessment:  Safety precautions to be maintained throughout the outpatient stay will include: orient to surroundings, keep bed in low position, maintain call bell within reach at all times, provide assistance with transfer out of bed and ambulation.  Medication Inspection Compliance: Sandy Byrd did not comply with our request to bring her pills to be counted. She was reminded that bringing the medication bottles, even when empty, is a requirement.  Medication: None brought in. Pill/Patch Count: None available to be counted. Bottle Appearance: No container available. Did not bring bottle(s) to appointment. Filled Date: N/A Last Medication intake:  TodaySafety precautions to be maintained throughout the outpatient stay will include: orient to surroundings, keep bed in low position, maintain call bell within reach at all times, provide assistance with transfer out of bed and ambulation.

## 2025-01-06 NOTE — Progress Notes (Signed)
 PROVIDER NOTE: Interpretation of information contained herein should be left to medically-trained personnel. Specific patient instructions are provided elsewhere under Patient Instructions section of medical record. This document was created in part using AI and STT-dictation technology, any transcriptional errors that may result from this process are unintentional.  Patient: Sandy Byrd Crimes  Service: E/M   PCP: Sherial Bail, MD  DOB: 07-19-1955  DOS: 01/06/2025  Provider: Emmy MARLA Blanch, NP  MRN: [id number]  Delivery: Face-to-face  Specialty: Interventional Pain Management  Type: Established Patient  Setting: Ambulatory outpatient facility  Specialty designation: 09  Referring Prov.: Sherial Bail, MD  Location: Outpatient office facility       History of present illness (HPI) Sandy Byrd, a 70 y.o. year old female, is here today because of her Chronic pain syndrome [G89.4]. Ms. Nam's primary complain today is Back Pain (lower)  Pertinent problems: Sandy Byrd has Lumbar facet joint syndrome; Lumbar degenerative disc disease; Chronic pain syndrome; H/O rotator cuff surgery (bilateral); Chronic pain of right knee; Chronic pain of both shoulders (right greater than left); Rotator cuff arthropathy of both shoulders; Myofascial pain syndrome, cervical; Primary osteoarthritis of right knee; Lumbar radiculopathy; Neuroforaminal stenosis of lumbar spine; Complex tear of meniscus of right knee; Sprain of medial collateral ligament of right knee; SI joint arthritis; Sacroiliac joint pain; Piriformis syndrome of right side; Fall; and Dementia without behavioral disturbance (HCC) on their pertinent problem list.  Pain Assessment: Severity of Chronic pain is reported as a 8 /10. Location: Back Lower/right leg to the knee. Onset: More than a month ago. Quality: Throbbing, Stabbing. Timing: Constant. Modifying factor(s): meds, lying down. Vitals:  height is 5' 7 (1.702 m) and weight is  120 lb (54.4 kg). Her temporal temperature is 98.6 F (37 C). Her blood pressure is 104/63 and her pulse is 73. Her respiration is 16 and oxygen saturation is 98%.  BMI: Estimated body mass index is 18.79 kg/m as calculated from the following:   Height as of this encounter: 5' 7 (1.702 m).   Weight as of this encounter: 120 lb (54.4 kg).  Last encounter: 09/21/2024. Last procedure: Visit date not found.  Reason for encounter: medication management. No change in medical history since last visit.  Patient's pain is at baseline.  Patient continues multimodal pain regimen as prescribed.  States that it provides pain relief and improvement in functional status.   Discussed the use of AI scribe software for clinical note transcription with the patient, who gave verbal consent to proceed.  History of Present Illness   Sandy Byrd is a 70 year old female who presents with post-stroke pain management issues. She is accompanied by Sandy Byrd, who assists with her care.  She experienced a stroke in November 2025, resulting in significant cognitive and physical impairments, including the loss of her ability to walk. She was hospitalized for approximately one week, followed by a couple of weeks in rehabilitation before returning home. Since then, she has been receiving physical and occupational therapy, and a nurse visits her home regularly. She is now able to walk with the aid of a walker, doing so two to three times a day depending on her condition.  She has ongoing issues with sleep, stating 'I don't sleep anymore' since the stroke. Her medication regimen includes pregabalin  (Lyrica ) and hydrocodone  for pain management, last given by her PCP. There is some confusion regarding the source of her hydrocodone  prescription, as it is being managed between two different providers, I advised  patient to ask her primary care provider. Sandy Byrd, a registered nurse and her medical power of attorney, manages her  medications and ensures she takes them daily.     Pharmacotherapy Assessment   Analgesic: Pregabalin  (Lyrica ) 100 mg 3 times daily as needed for neuropathic pain Monitoring: [place] PMP: PDMP reviewed during this encounter.       Pharmacotherapy: No side-effects or adverse reactions reported. Compliance: No problems identified. Effectiveness: Clinically acceptable.  Shela Reda CROME, RN  01/06/2025  2:38 PM  Sign when Signing Visit Nursing Pain Medication Assessment:  Safety precautions to be maintained throughout the outpatient stay will include: orient to surroundings, keep bed in low position, maintain call bell within reach at all times, provide assistance with transfer out of bed and ambulation.  Medication Inspection Compliance: Ms. Braid did not comply with our request to bring her pills to be counted. She was reminded that bringing the medication bottles, even when empty, is a requirement.  Medication: None brought in. Pill/Patch Count: None available to be counted. Bottle Appearance: No container available. Did not bring bottle(s) to appointment. Filled Date: N/A Last Medication intake:  TodaySafety precautions to be maintained throughout the outpatient stay will include: orient to surroundings, keep bed in low position, maintain call bell within reach at all times, provide assistance with transfer out of bed and ambulation.     UDS:  Summary  Date Value Ref Range Status  10/01/2023 Note  Final    Comment:    ==================================================================== [facility] 13 (MW) ==================================================================== Test                             Result       Flag       Units  Drug Present and Declared for Prescription Verification   Hydrocodone                     366          EXPECTED   ng/mg creat   Hydromorphone                  82           EXPECTED   ng/mg creat   Dihydrocodeine                 98            EXPECTED   ng/mg creat   Norhydrocodone                 624          EXPECTED   ng/mg creat    Sources of hydrocodone  include scheduled prescription medications.    Hydromorphone, dihydrocodeine and norhydrocodone are expected    metabolites of hydrocodone . Hydromorphone and dihydrocodeine are    also available as scheduled prescription medications.  ==================================================================== Test                      Result    Flag   Units      Ref Range   Creatinine              85               mg/dL      >=79 ==================================================================== Declared Medications:  The flagging and interpretation on this report are based on the  following declared medications.  Unexpected results may arise from  inaccuracies  in the declared medications.   **Note: The testing scope of this panel includes these medications:   Hydrocodone  (Norco)   **Note: The testing scope of this panel does not include the  following reported medications:   Acetaminophen   Acetaminophen  (Norco)  Albuterol   Baclofen  (Lioresal )  Benzonatate  (Tessalon )  Celecoxib (Celebrex)  Cyclosporine  (Restasis )  Epinephrine (EpiPen)  Escitalopram  (Lexapro )  Fluticasone  (Flonase )  Folic Acid   Furosemide (Lasix)  Hydrochlorothiazide (Dyazide)  Ketoconazole (Nizoral)  Levothyroxine  (Synthroid )  Magnesium   Metronidazole (MetroGel)  Potassium  Pregabalin  (Lyrica )  Simvastatin (Zocor)  Topical Lidocaine  (Lidoderm )  Triamcinolone (Kenalog)  Triamterene (Dyazide)  Turmeric  Vitamin C  Vitamin D  Vitamin D2 (Drisdol)  Zolpidem  (Ambien ) ==================================================================== For clinical consultation, please call [phone]. ====================================================================     No results found for: CBDTHCR No results found for: D8THCCBX No results found for: D9THCCBX  ROS  Constitutional:  Denies any fever or chills Gastrointestinal: No reported hemesis, hematochezia, vomiting, or acute GI distress Musculoskeletal: Low back pain Neurological: No reported episodes of acute onset apraxia, aphasia, dysarthria, agnosia, amnesia, paralysis, loss of coordination, or loss of consciousness  Medication Review  EPINEPHrine, HYDROcodone -acetaminophen , Magnesium , acetaminophen , albuterol , ascorbic acid, azelastine , baclofen , celecoxib, cyanocobalamin , cycloSPORINE , ergocalciferol, escitalopram , feeding supplement, fluticasone , folic acid , furosemide, ketoconazole, levothyroxine , lidocaine , methocarbamol, mirabegron  ER, omeprazole, pilocarpine , potassium chloride  SA, pregabalin , rOPINIRole , simvastatin, triamcinolone cream, and triamterene-hydrochlorothiazide  History Review  Allergy: Sandy Byrd is allergic to cephalexin, cephalosporins, prochlorperazine, shrimp extract, bee venom, latex, and other. Drug: Sandy Byrd  reports no history of drug use. Alcohol:  reports current alcohol use. Tobacco:  reports that she has been smoking cigarettes. She has never been exposed to tobacco smoke. She has never used smokeless tobacco. Social: Sandy Byrd  reports that she has been smoking cigarettes. She has never been exposed to tobacco smoke. She has never used smokeless tobacco. She reports current alcohol use. She reports that she does not use drugs. Medical:  has a past medical history of Anxiety, Arthritis, Depression, Hypertension, Rosacea, and Thyroid  disease. Surgical: Sandy Byrd  has a past surgical history that includes Shoulder surgery (Right, 2016) and Shoulder surgery (Right, 2017). Family: She was adopted. Family history is unknown by patient.  Laboratory Chemistry Profile   Renal Lab Results  Component Value Date   BUN 9 11/06/2024   CREATININE 0.90 11/06/2024   GFRNONAA >60 11/06/2024    Hepatic Lab Results  Component Value Date   AST 32 11/06/2024   ALT 17  11/06/2024   ALBUMIN 3.7 11/06/2024   ALKPHOS 52 11/06/2024   HCVAB NON REACTIVE 11/02/2024    Electrolytes Lab Results  Component Value Date   NA 135 11/06/2024   K 3.5 11/06/2024   CL 88 (L) 11/06/2024   CALCIUM 8.6 (L) 11/06/2024   MG 2.2 11/06/2024   PHOS 3.1 11/06/2024    Bone No results found for: VD25OH, VD125OH2TOT, CI6874NY7, CI7874NY7, 25OHVITD1, 25OHVITD2, 25OHVITD3, TESTOFREE, TESTOSTERONE  Inflammation (CRP: Acute Phase) (ESR: Chronic Phase) No results found for: CRP, ESRSEDRATE, LATICACIDVEN       Note: Above Lab results reviewed.  Recent Imaging Review  US  ABDOMEN LIMITED RUQ (LIVER/GB) CLINICAL DATA:  73059 Hyperbilirubinemia 26940  EXAM: ULTRASOUND ABDOMEN LIMITED RIGHT UPPER QUADRANT  COMPARISON:  12/13/2022  FINDINGS: Gallbladder:  Cholecystectomy.  Common bile duct:  Diameter: 4 mm  Liver:  Increased echogenicity. No focal lesion identified. No intrahepatic biliary ductal dilation. Portal vein is patent on color Doppler imaging with normal direction of blood flow towards the liver.  Right  Kidney:  Partially visualized. No mass. No hydronephrosis or nephrolithiasis.  Other: None.  IMPRESSION: 1. Cholecystectomy. No intrahepatic or extrahepatic biliary ductal dilation. 2. Hepatic steatosis.  Electronically Signed   By: Rogelia Myers M.D.   On: 11/02/2024 13:19 DG Pelvis Portable CLINICAL DATA:  Status post fall  EXAM: PORTABLE PELVIS 1 VIEWS  COMPARISON:  None Available.  FINDINGS: There is no evidence of pelvic fracture or diastasis. No pelvic bone lesions are seen.  IMPRESSION: No acute fracture or dislocation.  Electronically Signed   By: Limin  Xu M.D.   On: 11/02/2024 12:06 DG Chest Portable 1 View CLINICAL DATA:  Status post fall  EXAM: PORTABLE CHEST 1 VIEW  COMPARISON:  None Available.  FINDINGS: Normal lung volumes. No focal consolidations. No pleural effusion or pneumothorax.  The heart size and mediastinal contours are within normal limits. No radiographic finding of acute displaced fracture.  IMPRESSION: 1. No radiographic finding of acute displaced fracture. 2.  No focal consolidations.  Electronically Signed   By: Limin  Xu M.D.   On: 11/02/2024 12:04 CT Lumbar Spine Wo Contrast EXAM: CT OF THE LUMBAR SPINE WITHOUT CONTRAST 11/02/2024 10:24:52 AM  TECHNIQUE: CT of the lumbar spine was performed without the administration of intravenous contrast. Multiplanar reformatted images are provided for review. Automated exposure control, iterative reconstruction, and/or weight based adjustment of the mA/kV was utilized to reduce the radiation dose to as low as reasonably achievable.  COMPARISON: Lumbar MRI 03/05/last year.  CLINICAL HISTORY: 71 year old female. Trauma.  FINDINGS:  BONES AND ALIGNMENT: Normal lumbar segmentation. Chronic straightening of lumbar lordosis. Subtle chronic anterolisthesis of L3 on L4 is stable. Prior cholecystectomy. Chronic appearing and unhealed right L3 transverse process fracture (series 5 image 68). Generalized osteopenia which seems mildly advanced for age. Intact visible sacrum and SI joints. No acute osseous abnormality. No acute traumatic injury identified in the lumbar spine.  SOFT TISSUES: Calcified aortoiliac atherosclerosis. Normal caliber abdominal aorta. Partially visible diverticulosis of the large bowel. Negative lumbar paraspinal soft tissues.  DEGENERATIVE CHANGES: Chronic lumbar spine degeneration, similar to the MRI last year. Degenerative vacuum disc has progressed at both L1-L2 and L4-L5. Chronic grade 1 anterolisthesis at L3-L4 associated with chronic disc bulging and moderate facet arthropathy. 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 At L5-S1, right foraminal disc or disc osteophyte complex seems progressed from last year on series 6 image 107. Query right  L5 radiculitis.  IMPRESSION: 1. No acute traumatic injury identified in the lumbar spine. Chronic right L3 transverse process fracture. 2. Chronic lumbar spine degeneration, similar to MRI on 03/05/2023, but suspicion of progressed right foraminal disc or disc osteophyte complex at L5-S1 (series 6 image 107). Query right L5 radiculitis.  Electronically signed by: Helayne Hurst MD 11/02/2024 10:57 AM EST RP Workstation: [id number] CT Cervical Spine Wo Contrast EXAM: CT CERVICAL SPINE WITHOUT CONTRAST 11/02/2024 10:24:52 AM  TECHNIQUE: CT of the cervical spine was performed without the administration of intravenous contrast. Multiplanar reformatted images are provided for review. Automated exposure control, iterative reconstruction, and/or weight based adjustment of the mA/kV was utilized to reduce the radiation dose to as low as reasonably achievable.  COMPARISON: Head CT 11/02/2024 (reported separately). Cervical spine MRI 05/28/2023.  CLINICAL HISTORY: 70 year old female. Trauma.  FINDINGS:  CERVICAL SPINE:  BONES AND ALIGNMENT: Mild extra convex cervical scoliosis. Mild straightening of lordosis is stable. No acute fracture or traumatic malalignment.  DEGENERATIVE CHANGES: Chronic cervical spine degeneration, including disc and  endplate degeneration most pronounced at C5-C6, appears stable from the MRI 05/28/2023.  SOFT TISSUES: No prevertebral soft tissue swelling. Bulky calcified atherosclerosis at the skull base. Bilateral carotid bifurcation calcified atherosclerosis. Negative visible non-contrast thoracic inlet.  IMPRESSION: 1. No acute traumatic injury identified in the cervical spine. 2. Stable chronic cervical spine degeneration from MRI last year.  Electronically signed by: Helayne Hurst MD 11/02/2024 10:51 AM EST RP Workstation: [id number] CT HEAD WO CONTRAST ( ) EXAM: CT HEAD WITHOUT CONTRAST 11/02/2024 10:24:52 AM  TECHNIQUE: CT of the head was  performed without the administration of intravenous contrast. Automated exposure control, iterative reconstruction, and/or weight based adjustment of the mA/kV was utilized to reduce the radiation dose to as low as reasonably achievable.  COMPARISON: Brain MRI 05/30/2023.  CLINICAL HISTORY: 70 year old female. Trauma.  FINDINGS:  BRAIN AND VENTRICLES: No acute hemorrhage. No evidence of acute infarct. No hydrocephalus. No extra-axial collection. No mass effect or midline shift. Stable brain volume with minimal limits for age bulky calcified atherosclerosis at the skull base. No suspicious intracranial vascular hyperdensity. Chronic cerebral white matter changes most apparent by CT near the left frontal horns. Asymmetric left corona radiata involvement appears chronic and stable. 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  ORBITS: No acute abnormality.  SINUSES: Stable mild right mastoid effusion. Elsewhere middle ears, mastoids, paranasal sinuses well aerated.  SOFT TISSUES AND SKULL: No acute soft tissue abnormality. No skull fracture.  IMPRESSION: 1. No acute traumatic injury identified. 2. Stable chronic small vessel disease from MRI last year.  Electronically signed by: Helayne Hurst MD 11/02/2024 10:49 AM EST RP Workstation: [id number] Note: Reviewed        Physical Exam  Vitals: BP 104/63 (Cuff Size: Normal)   Pulse 73   Temp 98.6 F (37 C) (Temporal)   Resp 16   Ht 5' 7 (1.702 m)   Wt 120 lb (54.4 kg)   SpO2 98%   BMI 18.79 kg/m  BMI: Estimated body mass index is 18.79 kg/m as calculated from the following:   Height as of this encounter: 5' 7 (1.702 m).   Weight as of this encounter: 120 lb (54.4 kg). Ideal: Ideal body weight: 61.6 kg (135 lb 12.9 oz) General appearance: Well nourished, well developed, and well hydrated. In no apparent acute distress Mental status: Alert, oriented x 3 (person, place, & time)       Respiratory: No  evidence of acute respiratory distress Eyes: PERLA  Musculoskeletal: +LBP Assessment   Diagnosis Status  1. Chronic pain syndrome   2. SI joint arthritis   3. Sacroiliac joint pain   4. Medication management   5. Chronic pain of right knee   6. Myofascial pain syndrome, cervical    Controlled Controlled Controlled   Updated Problems: No problems updated.  Plan of Care  Problem-specific:  Assessment and Plan    Chronic pain syndrome Managed with pregabalin  and hydrocodone . Coordination needed to prevent overlapping prescriptions. - Discuss with Dr. Sherial regarding hydrocodone  prescription continuation. - Request one-month hydrocodone  supply from Dr.  Sherial if not continuing. - Coordinate with Sandy Holmes, RN, to clarify medication management. - Follow-up in one month for pain management review.       Sandy Byrd has a current medication list which includes the following long-term medication(s): azelastine , escitalopram , furosemide, levothyroxine , myrbetriq , omeprazole, potassium chloride  sa, ropinirole , simvastatin, triamterene-hydrochlorothiazide, ventolin  hfa, fluticasone , and pregabalin .  Pharmacotherapy (Medications Ordered): Meds ordered this encounter  Medications   pregabalin  (LYRICA )  100 MG capsule    Sig: Take 1 capsule (100 mg total) by mouth 3 (three) times daily.    Dispense:  90 capsule    Refill:  5    Fill one day early if pharmacy is closed on scheduled refill date. May substitute for generic if available.   Orders:  No orders of the defined types were placed in this encounter.      Return in about 1 month (around 02/06/2025) for (F2F), (MM), Emmy Blanch NP.    Recent Visits No visits were found meeting these conditions. Showing recent visits within past 90 days and meeting all other requirements Today's Visits Date Type Provider Dept  01/06/25 Office Visit Alaynah Schutter K, NP [facility]  Showing today's visits and  meeting all other requirements Future Appointments Date Type Provider Dept  02/02/25 Appointment Faithe Ariola K, NP [facility]  Showing future appointments within next 90 days and meeting all other requirements  I discussed the assessment and treatment plan with the patient. The patient was provided an opportunity to ask questions and all were answered. The patient agreed with the plan and demonstrated an understanding of the instructions.  Patient advised to call back or seek an in-person evaluation if the symptoms or condition worsens.  I personally spent a total of 30 minutes in the care of the patient today including preparing to see the patient, getting/reviewing separately obtained history, performing a medically appropriate exam/evaluation, counseling and educating, placing orders, referring and communicating with other health care professionals, documenting clinical information in the EHR, independently interpreting results, communicating results, and coordinating care.   Note by: Marilin Kofman K Collis Thede, NP (TTS and AI technology used. I apologize for any typographical errors that were not detected and corrected.) Date: 01/06/2025; Time: 3:21 PM

## 2025-01-26 NOTE — Progress Notes (Signed)
 History of Present Illness: The patient is an 70 y.o. female seen in clinic today for follow-up evaluation of her right knee.  Patient reports pain up to about a 7 out of 10 over the medial and anterior aspect of her right knee with episodes of sensations of instability in the knee.  She is been taking Celebrex and over-the-counter medications and using lidocaine  patches with some relief but reports ongoing severe pain.  She had a steroid shot last year and a hyaluronic acid viscosupplementation injection about 7 months ago.  She reports she got about 4 to 5 months of relief from that series of shots and is interested in doing another steroid injection and another hyaluronic acid injection if possible.  The patient denies fevers, chills, numbness, tingling, shortness of breath, chest pain, recent illness, or any trauma.  Past Medical History: Past Medical History:  Diagnosis Date   Anxiety    Arthritis    Depression    History of stroke    Hypertension    Osteoporosis    Rosacea    Thyroid  disease     Past Surgical History: Past Surgical History:  Procedure Laterality Date   ARTHROSCOPIC ROTATOR CUFF REPAIR Left 08/2015   ARTHROSCOPIC ROTATOR CUFF REPAIR Right 06/2016   RADIOFREQUENCY ABLATION SPINAL NERVES  04/2021   CHOLECYSTECTOMY      Past Family History: Family History  Adopted: Yes  Family history unknown: Yes    Medications: Current Outpatient Medications  Medication Sig Dispense Refill   acetaminophen  (TYLENOL ) 500 MG tablet Take 1,000 mg by mouth every 8 (eight) hours     ALPRAZolam (XANAX) 0.5 MG tablet Take 0.5 mg by mouth once daily     ascorbic acid, vitamin C, (VITAMIN C) 500 MG tablet Take 500 mg by mouth once daily     azelastine  (ASTELIN ) 137 mcg nasal spray USE 1 TO 2 SPRAY(S) IN EACH NOSTRIL TWICE DAILY     baclofen  (LIORESAL ) 10 MG tablet TAKE 1 TABLET BY MOUTH TWICE DAILY AS NEEDED FOR MUSCLE SPASM 60 tablet 0   celecoxib (CELEBREX) 200 MG  capsule Take 1 capsule by mouth twice daily 180 capsule 0   cyanocobalamin  (VITAMIN B12) 1000 MCG tablet Take 2,000 mcg by mouth once daily     ergocalciferol, vitamin D2, 1,250 mcg (50,000 unit) capsule Take 1 capsule by mouth once a week 12 capsule 0   escitalopram  oxalate (LEXAPRO ) 20 MG tablet Take 1 tablet (20 mg total) by mouth once daily 90 tablet 1   fluticasone  propionate (FLONASE ) 50 mcg/actuation nasal spray Place into one nostril 2 (two) times daily     folic acid  (FOLVITE ) 1 MG tablet Take 1 mg by mouth once daily     FUROsemide (LASIX) 40 MG tablet Take 1 tablet by mouth once daily 30 tablet 0   HYDROcodone -acetaminophen  (NORCO) 7.5-325 mg tablet Take 1 tablet by mouth 3 (three) times a day for 15 days 45 tablet 0   ketoconazole (NIZORAL) 2 % shampoo      levothyroxine  (SYNTHROID ) 100 MCG tablet TAKE 1 TABLET BY MOUTH ONCE DAILY ON  AN  EMPTY  STOMACH  WITH  A  GLASS  OF  WATER  AT  LEAST  30  TO  60  MINUTES  BEFORE  BREAKFAST 90 tablet 0   lidocaine  (LIDODERM ) 5 % patch Place 1 patch onto the skin     metroNIDAZOLE (METROGEL) 1 % gel 2 (two) times daily     omeprazole (PRILOSEC) 40 MG  DR capsule Take 40 mg by mouth 2 (two) times daily before meals     pilocarpine  (SALAGEN ) 5 mg tablet Take 1 tablet (5 mg total) by mouth 3 (three) times daily as needed 90 tablet 1   potassium chloride  (KLOR-CON  M20) 20 MEQ ER tablet Take 1 tablet by mouth once daily 30 tablet 0   pregabalin  (LYRICA ) 100 MG capsule Take 1 capsule (100 mg total) by mouth 3 (three) times daily 90 capsule 2   RESTASIS  0.05 % ophthalmic emulsion INSTILL 1 DROP INTO EACH EYE TWICE DAILY 180 each 1   rOPINIRole  (REQUIP ) 1 MG immediate release tablet TAKE 1 TABLET BY MOUTH AT BEDTIME 90 tablet 0   simvastatin (ZOCOR) 40 MG tablet Take 40 mg by mouth at bedtime Take 1 tablet (40 mg total) by mouth at bedtime     triamcinolone 0.1 % cream Apply topically 2 (two) times daily     VENTOLIN  HFA 90  mcg/actuation inhaler INHALE 2 PUFFS BY MOUTH EVERY 4 HOURS AS NEEDED FOR WHEEZING 18 g 0   No current facility-administered medications for this visit.    Allergies: Allergies  Allergen Reactions   Cephalosporins Hives and Swelling   Keflex [Cephalexin] Hives and Swelling   Prochlorperazine Hives and Swelling   Shellfish Containing Products Anaphylaxis   Shrimp Anaphylaxis   Sulfa (Sulfonamide Antibiotics) Hives and Swelling   Adhesive Rash    Plastic Tape (ok to use paper tape)   Adhesive Tape-Silicones Rash    Plastic Tape (ok to use paper tape)   Honey Hives    Welts   Latex, Natural Rubber Other (See Comments)    Skin breaks out really bad    Other Other (See Comments)    Cannot do plastic tape   Silicones Rash    silicones   Venom-Honey Bee Hives    Wasps sting     Visit Vitals: Vitals:   01/26/25 1113  BP: 120/82     Review of Systems:  A comprehensive 14 point ROS was performed, reviewed, and the pertinent orthopaedic findings are documented in the HPI.  Physical Exam: General/Constitutional: No apparent distress: well-nourished and well developed. Eyes: Pupils equal, round with synchronous movement. Respiratory: Non-labored breathing. Cardiac:  Heart rate is regular. Integumentary: No impressive skin lesions present, except as noted in detailed exam. Neuro/Psych: Normal mood and affect, oriented to person, place and time.  Comprehensive Knee Exam: Gait Mildly antalgic favoring the right  Alignment Neutral    Inspection   Right  Skin Normal appearance with no obvious deformity.  No ecchymosis or erythema.  Soft Tissue No focal soft tissue swelling  Quad Atrophy None    Palpation    Right  Tenderness Medial joint line and medial parapatellar tenderness to palpation  Crepitus No patellofemoral or tibiofemoral crepitus  Effusion None    Range of Motion   Right  Flexion  0-110  Extension  Full knee extension without  hyperextension    Ligamentous Exam   Right  Lachman Normal  Valgus 0 1+ no pain  Valgus 30 Normal  Varus 0 Normal  Varus 30 Normal  Anterior Drawer Normal  Posterior Drawer Normal    Meniscal Exam   Right  Hyperflexion Test Positive  Hyperextension Test Negative  McMurray's Positive                Neurovascular   Right  Quadriceps Strength 5/5  Hamstring Strength 5/5  Hip Abductor Strength 4/5  Distal Motor Normal  Distal  Sensory Normal light touch sensation  Distal Pulses Normal      Imaging Studies: I have reviewed AP, lateral,sunrise, and flexed PA weight bearing knee X-rays (4 views) of the right knee taken at the previous office visit show moderate degenerative changes with medial joint space narrowing with bone-on-bone articulation and tricompartmental changes with lateral osteophytes and patellofemoral joint space narrowing sclerosis.  Kellgren-Lawrence grade 3/4.  AP, sunrise and flexed PA of the left knee show medial joint space narrowing patellofemoral narrowing with medial bone-on-bone articulation, sclerosis and early osteophyte formation, Lawrence grade 3.  No fractures or dislocations noted in either knee.  Assessment:  Right knee osteoarthritis  Plan: Based on the clinical and radiographic evaluation of the patient their primary symptomology is likely secondary to osteoarthritis in the right knee.  I reviewed the natural history and clinical course of osteoarthritis with the patient and reviewed their radiology and physical exam findings consistent with the disease process.  We discussed the treatment options for osteoarthritis including but not limited to; weight loss, home exercise program, anti-inflammatory medications, bracing, lifestyle modification, injection options including steroid and hyaluronic acid based injections, physical therapy, and the possibility of surgery.  A thorough discussion was held discussing the risks and benefits of each of these  interventions and potential for symptomatic improvement.  Patient has had good relief from the last steroid and hyaluronic acid injections and would like to repeat a steroid injection and get another hyaluronic acid injection at this time.  As long she continues to have significant midterm the long-term relief from these injections we will continue with conservative course.  All questions answered she will follow-up with injection once authorized.  Right knee steroid injection    Consent After discussing the various treatment options for the condition,  It was agreed that a corticosteroid injection would be the next step in treatment.  The nature of and the indications for a corticosteroid and / or local anaesthetic injection were reviewed in detail with the patient today.  The inherent risks of injection including infection, allergic reaction, increased pain, incomplete relief or temporary relief of symptoms, alterations of blood glucose levels requiring careful monitoring and treatment as indicated, tendon, ligament or articular cartilage rupture or degeneration, nerve injury, skin depigmentation, and/or fatty atrophy were discussed.     Procedure After the risks and benefits of the procedure were explained, consent was given, and time-out was performed. The site for the injection was properly marked and prepped with Chlorhexadine/Isopropyl alcohol solution.      The injection site was anesthetized with ethyl chloride.  The right knee was injected with a 22 gauge 1.5 inch needle.  40 milligrams of Triamcinolone, 3 milliliters of 0.5% Bupivacaine, and 3 milliliters of 1% Lidocaine  using a sterile technique. During injection, there was unrestricted flow and care was taken not to inject corticosteroid into the skin or subcutaneous tissues.    A sterile band-aide was applied.  Post-injection instructions were given regarding post-procedure care, when to follow up in clinic and what to expect from the  procedure.  The patient tolerated the injection well and was discharged without complication.     Portions of this record have been created using Scientist, clinical (histocompatibility and immunogenetics).  Dictation errors have been sought, but may not have been identified and corrected.  Arthea Sheer MD    Large Joint Injection: R knee  Date/Time: 01/26/2025 11:00 AM  Performed by: Sheer Arthea Arch, MD Authorized by: Sheer Arthea Arch, MD   Needle Size:  22 G Location:  Knee Site:  R knee Topical skin anesthesia: obtained using ethyl chloride spray   Medications:  2 mL BUPivacaine HCl 0.5 %; 2 mL lidocaine  1 %; 40 mg triamcinolone acetonide 40 mg/mL

## 2025-01-27 NOTE — Progress Notes (Unsigned)
 PROVIDER NOTE: Interpretation of information contained herein should be left to medically-trained personnel. Specific patient instructions are provided elsewhere under Patient Instructions section of medical record. This document was created in part using AI and STT-dictation technology, any transcriptional errors that may result from this process are unintentional.  Patient: Sandy Byrd  Service: E/M   PCP: Sherial Bail, MD  DOB: 04/06/1955  DOS: 01/28/2025  Provider: Emmy MARLA Blanch, NP  MRN: [id number]  Delivery: Face-to-face  Specialty: Interventional Pain Management  Type: Established Patient  Setting: Ambulatory outpatient facility  Specialty designation: 09  Referring Prov.: Sherial Bail, MD  Location: Outpatient office facility       History of present illness (HPI) Sandy Byrd, a 70 y.o. year old female, is here today because of her No primary diagnosis found.. Sandy Byrd's primary complain today is No chief complaint on file.  Pertinent problems: Sandy Byrd has Lumbar facet joint syndrome; Lumbar degenerative disc disease; Chronic pain syndrome; H/O rotator cuff surgery (bilateral); Chronic pain of right knee; Chronic pain of both shoulders (right greater than left); Rotator cuff arthropathy of both shoulders; Myofascial pain syndrome, cervical; Primary osteoarthritis of right knee; Lumbar radiculopathy; Neuroforaminal stenosis of lumbar spine; Complex tear of meniscus of right knee; Sprain of medial collateral ligament of right knee; SI joint arthritis; Sacroiliac joint pain; Piriformis syndrome of right side; Fall; and Dementia without behavioral disturbance (HCC) on their pertinent problem list.  Pain Assessment: Severity of   is reported as a  /10. Location:    / . Onset:  . Quality:  . Timing:  . Modifying factor(s):  Sandy Vitals:  vitals were not taken for this visit.  BMI: Estimated body mass index is 18.79 kg/m as calculated from the following:   Height  as of 01/06/25: 5' 7 (1.702 m).   Weight as of 01/06/25: 120 lb (54.4 kg).  Last encounter: 01/06/2025. Last procedure: Visit date not found.  Reason for encounter:  *** .   Discussed the use of AI scribe software for clinical note transcription with the patient, who gave verbal consent to proceed.  History of Present Illness           Pharmacotherapy Assessment   Analgesic: Pregabalin  (Lyrica ) 100 mg 3 times daily as needed for neuropathic pain Monitoring: [place] PMP: PDMP reviewed during this encounter.       Pharmacotherapy: No side-effects or adverse reactions reported. Compliance: No problems identified. Effectiveness: Clinically acceptable.  Shela Reda CROME, RN  01/06/2025  2:38 PM  Sign when Signing Visit Nursing Pain Medication Assessment:  Safety precautions to be maintained throughout the outpatient stay will include: orient to surroundings, keep bed in low position, maintain call bell within reach at all times, provide assistance with transfer out of bed and ambulation.  Medication Inspection Compliance: [name] did not comply with our request to bring her pills to be counted. She was reminded that bringing the medication bottles, even when empty, is a requirement.  Medication: None brought in. Pill/Patch Count: None available to be counted. Bottle Appearance: No container available. Did not bring bottle(s) to appointment. Filled Date: N/A Last Medication intake:  TodaySafety precautions to be maintained throughout the outpatient stay will include: orient to surroundings, keep bed in low position, maintain call bell within reach at all times, provide assistance with transfer out of bed and ambulation.     UDS:  Summary  Date Value Ref Range Status  10/01/2023 Note  Final    Comment:    ====================================================================  [employer] Select 13 (MW) ==================================================================== Test                              Result       Flag       Units  Drug Present and Declared for Prescription Verification   Hydrocodone                     366          EXPECTED   ng/mg creat   Hydromorphone                  82           EXPECTED   ng/mg creat   Dihydrocodeine                 98           EXPECTED   ng/mg creat   Norhydrocodone                 624          EXPECTED   ng/mg creat    Sources of hydrocodone  include scheduled prescription medications.    Hydromorphone, dihydrocodeine and norhydrocodone are expected    metabolites of hydrocodone . Hydromorphone and dihydrocodeine are    also available as scheduled prescription medications.  ==================================================================== Test                      Result    Flag   Units      Ref Range   Creatinine              85               mg/dL      >=79 ==================================================================== Declared Medications:  The flagging and interpretation on this report are based on the  following declared medications.  Unexpected results may arise from  inaccuracies in the declared medications.   **Note: The testing scope of this panel includes these medications:   Hydrocodone  (Norco)   **Note: The testing scope of this panel does not include the  following reported medications:   Acetaminophen   Acetaminophen  (Norco)  Albuterol   Baclofen  (Lioresal )  Benzonatate  (Tessalon )  Celecoxib (Celebrex)  Cyclosporine  (Restasis )  Epinephrine (EpiPen)  Escitalopram  (Lexapro )  Fluticasone  (Flonase )  Folic Acid   Furosemide (Lasix)  Hydrochlorothiazide (Dyazide)  Ketoconazole (Nizoral)  Levothyroxine  (Synthroid )  Magnesium   Metronidazole (MetroGel)  Potassium  Pregabalin  (Lyrica )  Simvastatin (Zocor)  Topical Lidocaine  (Lidoderm )  Triamcinolone (Kenalog)  Triamterene (Dyazide)  Turmeric  Vitamin C  Vitamin D  Vitamin D2 (Drisdol)  Zolpidem   (Ambien ) ==================================================================== For clinical consultation, please call [phone]. ====================================================================     No results found for: CBDTHCR No results found for: D8THCCBX No results found for: D9THCCBX  ROS  Constitutional: Denies any fever or chills Gastrointestinal: No reported hemesis, hematochezia, vomiting, or acute GI distress Musculoskeletal: Low back pain Neurological: No reported episodes of acute onset apraxia, aphasia, dysarthria, agnosia, amnesia, paralysis, loss of coordination, or loss of consciousness  Medication Review  EPINEPHrine, HYDROcodone -acetaminophen , Magnesium , acetaminophen , albuterol , ascorbic acid, azelastine , baclofen , celecoxib, cyanocobalamin , cycloSPORINE , ergocalciferol, escitalopram , feeding supplement, fluticasone , folic acid , furosemide, ketoconazole, levothyroxine , lidocaine , methocarbamol, mirabegron  ER, omeprazole, pilocarpine , potassium chloride  SA, pregabalin , rOPINIRole , simvastatin, triamcinolone cream, and triamterene-hydrochlorothiazide  History Review  Allergy: Sandy Byrd is allergic to cephalexin, cephalosporins, prochlorperazine, shrimp extract, bee venom, latex, and other. Drug: Sandy Byrd  reports  no history of drug use. Alcohol:  reports current alcohol use. Tobacco:  reports that she has been smoking cigarettes. She has never been exposed to tobacco smoke. She has never used smokeless tobacco. Social: Sandy Byrd  reports that she has been smoking cigarettes. She has never been exposed to tobacco smoke. She has never used smokeless tobacco. She reports current alcohol use. She reports that she does not use drugs. Medical:  has a past medical history of Anxiety, Arthritis, Depression, Hypertension, Rosacea, and Thyroid  disease. Surgical: Ms. Hansell  has a past surgical history that includes Shoulder surgery (Right, 2016) and  Shoulder surgery (Right, 2017). Family: She was adopted. Family history is unknown by patient.  Laboratory Chemistry Profile   Renal Lab Results  Component Value Date   BUN 9 11/06/2024   CREATININE 0.90 11/06/2024   GFRNONAA >60 11/06/2024    Hepatic Lab Results  Component Value Date   AST 32 11/06/2024   ALT 17 11/06/2024   ALBUMIN 3.7 11/06/2024   ALKPHOS 52 11/06/2024   HCVAB NON REACTIVE 11/02/2024    Electrolytes Lab Results  Component Value Date   NA 135 11/06/2024   K 3.5 11/06/2024   CL 88 (L) 11/06/2024   CALCIUM 8.6 (L) 11/06/2024   MG 2.2 11/06/2024   PHOS 3.1 11/06/2024    Bone No results found for: VD25OH, VD125OH2TOT, CI6874NY7, CI7874NY7, 25OHVITD1, 25OHVITD2, 25OHVITD3, TESTOFREE, TESTOSTERONE  Inflammation (CRP: Acute Phase) (ESR: Chronic Phase) No results found for: CRP, ESRSEDRATE, LATICACIDVEN       Note: Above Lab results reviewed.  Recent Imaging Review  US  ABDOMEN LIMITED RUQ (LIVER/GB) CLINICAL DATA:  73059 Hyperbilirubinemia 26940  EXAM: ULTRASOUND ABDOMEN LIMITED RIGHT UPPER QUADRANT  COMPARISON:  12/13/2022  FINDINGS: Gallbladder:  Cholecystectomy.  Common bile duct:  Diameter: 4 mm  Liver:  Increased echogenicity. No focal lesion identified. No intrahepatic biliary ductal dilation. Portal vein is patent on color Doppler imaging with normal direction of blood flow towards the liver.  Right Kidney:  Partially visualized. No mass. No hydronephrosis or nephrolithiasis.  Other: None.  IMPRESSION: 1. Cholecystectomy. No intrahepatic or extrahepatic biliary ductal dilation. 2. Hepatic steatosis.  Electronically Signed   By: Rogelia Myers M.D.   On: 11/02/2024 13:19 DG Pelvis Portable CLINICAL DATA:  Status post fall  EXAM: PORTABLE PELVIS 1 VIEWS  COMPARISON:  None Available.  FINDINGS: There is no evidence of pelvic fracture or diastasis. No pelvic bone lesions are  seen.  IMPRESSION: No acute fracture or dislocation.  Electronically Signed   By: Limin  Xu M.D.   On: 11/02/2024 12:06 DG Chest Portable 1 View CLINICAL DATA:  Status post fall  EXAM: PORTABLE CHEST 1 VIEW  COMPARISON:  None Available.  FINDINGS: Normal lung volumes. No focal consolidations. No pleural effusion or pneumothorax. The heart size and mediastinal contours are within normal limits. No radiographic finding of acute displaced fracture.  IMPRESSION: 1. No radiographic finding of acute displaced fracture. 2.  No focal consolidations.  Electronically Signed   By: Limin  Xu M.D.   On: 11/02/2024 12:04 CT Lumbar Spine Wo Contrast EXAM: CT OF THE LUMBAR SPINE WITHOUT CONTRAST 11/02/2024 10:24:52 AM  TECHNIQUE: CT of the lumbar spine was performed without the administration of intravenous contrast. Multiplanar reformatted images are provided for review. Automated exposure control, iterative reconstruction, and/or weight based adjustment of the mA/kV was utilized to reduce the radiation dose to as low as reasonably achievable.  COMPARISON: Lumbar MRI 03/05/last year.  CLINICAL HISTORY: 69 year  old female. Trauma.  FINDINGS:  BONES AND ALIGNMENT: Normal lumbar segmentation. Chronic straightening of lumbar lordosis. Subtle chronic anterolisthesis of L3 on L4 is stable. Prior cholecystectomy. Chronic appearing and unhealed right L3 transverse process fracture (series 5 image 68). Generalized osteopenia which seems mildly advanced for age. Intact visible sacrum and SI joints. No acute osseous abnormality. No acute traumatic injury identified in the lumbar spine.  SOFT TISSUES: Calcified aortoiliac atherosclerosis. Normal caliber abdominal aorta. Partially visible diverticulosis of the large bowel. Negative lumbar paraspinal soft tissues.  DEGENERATIVE CHANGES: Chronic lumbar spine degeneration, similar to the MRI last year. Degenerative vacuum disc has  progressed at both L1-L2 and L4-L5. Chronic grade 1 anterolisthesis at L3-L4 associated with chronic disc bulging and moderate facet arthropathy. 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 At L5-S1, right foraminal disc or disc osteophyte complex seems progressed from last year on series 6 image 107. Query right L5 radiculitis.  IMPRESSION: 1. No acute traumatic injury identified in the lumbar spine. Chronic right L3 transverse process fracture. 2. Chronic lumbar spine degeneration, similar to MRI on 03/05/2023, but suspicion of progressed right foraminal disc or disc osteophyte complex at L5-S1 (series 6 image 107). Query right L5 radiculitis.  Electronically signed by: Helayne Hurst MD 11/02/2024 10:57 AM EST RP Workstation: [id number] CT Cervical Spine Wo Contrast EXAM: CT CERVICAL SPINE WITHOUT CONTRAST 11/02/2024 10:24:52 AM  TECHNIQUE: CT of the cervical spine was performed without the administration of intravenous contrast. Multiplanar reformatted images are provided for review. Automated exposure control, iterative reconstruction, and/or weight based adjustment of the mA/kV was utilized to reduce the radiation dose to as low as reasonably achievable.  COMPARISON: Head CT 11/02/2024 (reported separately). Cervical spine MRI 05/28/2023.  CLINICAL HISTORY: 70 year old female. Trauma.  FINDINGS:  CERVICAL SPINE:  BONES AND ALIGNMENT: Mild extra convex cervical scoliosis. Mild straightening of lordosis is stable. No acute fracture or traumatic malalignment.  DEGENERATIVE CHANGES: Chronic cervical spine degeneration, including disc and endplate degeneration most pronounced at C5-C6, appears stable from the MRI 05/28/2023.  SOFT TISSUES: No prevertebral soft tissue swelling. Bulky calcified atherosclerosis at the skull base. Bilateral carotid bifurcation calcified atherosclerosis. Negative visible non-contrast thoracic  inlet.  IMPRESSION: 1. No acute traumatic injury identified in the cervical spine. 2. Stable chronic cervical spine degeneration from MRI last year.  Electronically signed by: Helayne Hurst MD 11/02/2024 10:51 AM EST RP Workstation: [id number] CT HEAD WO CONTRAST ( ) EXAM: CT HEAD WITHOUT CONTRAST 11/02/2024 10:24:52 AM  TECHNIQUE: CT of the head was performed without the administration of intravenous contrast. Automated exposure control, iterative reconstruction, and/or weight based adjustment of the mA/kV was utilized to reduce the radiation dose to as low as reasonably achievable.  COMPARISON: Brain MRI 05/30/2023.  CLINICAL HISTORY: 70 year old female. Trauma.  FINDINGS:  BRAIN AND VENTRICLES: No acute hemorrhage. No evidence of acute infarct. No hydrocephalus. No extra-axial collection. No mass effect or midline shift. Stable brain volume with minimal limits for age bulky calcified atherosclerosis at the skull base. No suspicious intracranial vascular hyperdensity. Chronic cerebral white matter changes most apparent by CT near the left frontal horns. Asymmetric left corona radiata involvement appears chronic and stable. 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  ORBITS: No acute abnormality.  SINUSES: Stable mild right mastoid effusion. Elsewhere middle ears, mastoids, paranasal sinuses well aerated.  SOFT TISSUES AND SKULL: No acute soft tissue abnormality. No skull fracture.  IMPRESSION: 1. No acute traumatic injury identified. 2. Stable  chronic small vessel disease from MRI last year.  Electronically signed by: Helayne Hurst MD 11/02/2024 10:49 AM EST RP Workstation: [id number] Note: Reviewed        Physical Exam  Vitals: BP 104/63 (Cuff Size: Normal)   Pulse 73   Temp 98.6 F (37 C) (Temporal)   Resp 16   Ht 5' 7 (1.702 m)   Wt 120 lb (54.4 kg)   SpO2 98%   BMI 18.79 kg/m  BMI: Estimated body mass index is 18.79 kg/m as  calculated from the following:   Height as of this encounter: 5' 7 (1.702 m).   Weight as of this encounter: 120 lb (54.4 kg). Ideal: Ideal body weight: 61.6 kg (135 lb 12.9 oz) General appearance: Well nourished, well developed, and well hydrated. In no apparent acute distress Mental status: Alert, oriented x 3 (person, place, & time)       Respiratory: No evidence of acute respiratory distress Eyes: PERLA  Musculoskeletal: +LBP Assessment   Diagnosis Status  1. Chronic pain syndrome   2. SI joint arthritis   3. Sacroiliac joint pain   4. Medication management   5. Chronic pain of right knee   6. Myofascial pain syndrome, cervical    Controlled Controlled Controlled   Updated Problems: No problems updated.  Plan of Care  Problem-specific:  Assessment and Plan    Monitoring: [place] PMP: PDMP not reviewed this encounter.       Pharmacotherapy: No side-effects or adverse reactions reported. Compliance: No problems identified. Effectiveness: Clinically acceptable.  No notes on file  UDS:  Summary  Date Value Ref Range Status  10/01/2023 Note  Final    Comment:    ==================================================================== [employer] Select 13 (MW) ==================================================================== Test                             Result       Flag       Units  Drug Present and Declared for Prescription Verification   Hydrocodone                     366          EXPECTED   ng/mg creat   Hydromorphone                  82           EXPECTED   ng/mg creat   Dihydrocodeine                 98           EXPECTED   ng/mg creat   Norhydrocodone                 624          EXPECTED   ng/mg creat    Sources of hydrocodone  include scheduled prescription medications.    Hydromorphone, dihydrocodeine and norhydrocodone are expected    metabolites of hydrocodone . Hydromorphone and dihydrocodeine are    also available as scheduled prescription  medications.  ==================================================================== Test                      Result    Flag   Units      Ref Range   Creatinine              85  mg/dL      >=79 ==================================================================== Declared Medications:  The flagging and interpretation on this report are based on the  following declared medications.  Unexpected results may arise from  inaccuracies in the declared medications.   **Note: The testing scope of this panel includes these medications:   Hydrocodone  (Norco)   **Note: The testing scope of this panel does not include the  following reported medications:   Acetaminophen   Acetaminophen  (Norco)  Albuterol   Baclofen  (Lioresal )  Benzonatate  (Tessalon )  Celecoxib (Celebrex)  Cyclosporine  (Restasis )  Epinephrine (EpiPen)  Escitalopram  (Lexapro )  Fluticasone  (Flonase )  Folic Acid   Furosemide (Lasix)  Hydrochlorothiazide (Dyazide)  Ketoconazole (Nizoral)  Levothyroxine  (Synthroid )  Magnesium   Metronidazole (MetroGel)  Potassium  Pregabalin  (Lyrica )  Simvastatin (Zocor)  Topical Lidocaine  (Lidoderm )  Triamcinolone (Kenalog)  Triamterene (Dyazide)  Turmeric  Vitamin C  Vitamin D  Vitamin D2 (Drisdol)  Zolpidem  (Ambien ) ==================================================================== For clinical consultation, please call [phone]. ====================================================================     No results found for: CBDTHCR No results found for: D8THCCBX No results found for: D9THCCBX  ROS  Constitutional: Denies any fever or chills Gastrointestinal: No reported hemesis, hematochezia, vomiting, or acute GI distress Musculoskeletal: Denies any acute onset joint swelling, redness, loss of ROM, or weakness Neurological: No reported episodes of acute onset apraxia, aphasia, dysarthria, agnosia, amnesia, paralysis, loss of coordination, or loss of  consciousness  Medication Review  EPINEPHrine, HYDROcodone -acetaminophen , acetaminophen , albuterol , ascorbic acid, azelastine , baclofen , celecoxib, cycloSPORINE , ergocalciferol, escitalopram , feeding supplement, folic acid , furosemide, ketoconazole, levothyroxine , lidocaine , methocarbamol, mirabegron  ER, omeprazole, pilocarpine , potassium chloride  SA, pregabalin , rOPINIRole , simvastatin, triamcinolone cream, and triamterene-hydrochlorothiazide  History Review  Allergy: Sandy Byrd is allergic to cephalexin, cephalosporins, prochlorperazine, shrimp extract, bee venom, latex, and other. Drug: Sandy Byrd  reports no history of drug use. Alcohol:  reports current alcohol use. Tobacco:  reports that she has been smoking cigarettes. She has never been exposed to tobacco smoke. She has never used smokeless tobacco. Social: Sandy Byrd  reports that she has been smoking cigarettes. She has never been exposed to tobacco smoke. She has never used smokeless tobacco. She reports current alcohol use. She reports that she does not use drugs. Medical:  has a past medical history of Anxiety, Arthritis, Depression, Hypertension, Rosacea, and Thyroid  disease. Surgical: Sandy Byrd  has a past surgical history that includes Shoulder surgery (Right, 2016) and Shoulder surgery (Right, 2017). Family: She was adopted. Family history is unknown by patient.  Laboratory Chemistry Profile   Renal Lab Results  Component Value Date   BUN 9 11/06/2024   CREATININE 0.90 11/06/2024   GFRNONAA >60 11/06/2024    Hepatic Lab Results  Component Value Date   AST 32 11/06/2024   ALT 17 11/06/2024   ALBUMIN 3.7 11/06/2024   ALKPHOS 52 11/06/2024   HCVAB NON REACTIVE 11/02/2024    Electrolytes Lab Results  Component Value Date   NA 135 11/06/2024   K 3.5 11/06/2024   CL 88 (L) 11/06/2024   CALCIUM 8.6 (L) 11/06/2024   MG 2.2 11/06/2024   PHOS 3.1 11/06/2024    Bone No results found for: VD25OH,  VD125OH2TOT, CI6874NY7, CI7874NY7, 25OHVITD1, 25OHVITD2, 25OHVITD3, TESTOFREE, TESTOSTERONE  Inflammation (CRP: Acute Phase) (ESR: Chronic Phase) No results found for: CRP, ESRSEDRATE, LATICACIDVEN       Note: Above Lab results reviewed.  Recent Imaging Review  US  ABDOMEN LIMITED RUQ (LIVER/GB) CLINICAL DATA:  73059 Hyperbilirubinemia 26940  EXAM: ULTRASOUND ABDOMEN LIMITED RIGHT UPPER QUADRANT  COMPARISON:  12/13/2022  FINDINGS: Gallbladder:  Cholecystectomy.  Common bile duct:  Diameter: 4 mm  Liver:  Increased echogenicity. No focal lesion identified. No intrahepatic biliary ductal dilation. Portal vein is patent on color Doppler imaging with normal direction of blood flow towards the liver.  Right Kidney:  Partially visualized. No mass. No hydronephrosis or nephrolithiasis.  Other: None.  IMPRESSION: 1. Cholecystectomy. No intrahepatic or extrahepatic biliary ductal dilation. 2. Hepatic steatosis.  Electronically Signed   By: Rogelia Myers M.D.   On: 11/02/2024 13:19 DG Pelvis Portable CLINICAL DATA:  Status post fall  EXAM: PORTABLE PELVIS 1 VIEWS  COMPARISON:  None Available.  FINDINGS: There is no evidence of pelvic fracture or diastasis. No pelvic bone lesions are seen.  IMPRESSION: No acute fracture or dislocation.  Electronically Signed   By: Limin  Xu M.D.   On: 11/02/2024 12:06 DG Chest Portable 1 View CLINICAL DATA:  Status post fall  EXAM: PORTABLE CHEST 1 VIEW  COMPARISON:  None Available.  FINDINGS: Normal lung volumes. No focal consolidations. No pleural effusion or pneumothorax. The heart size and mediastinal contours are within normal limits. No radiographic finding of acute displaced fracture.  IMPRESSION: 1. No radiographic finding of acute displaced fracture. 2.  No focal consolidations.  Electronically Signed   By: Limin  Xu M.D.   On: 11/02/2024 12:04 CT Lumbar Spine Wo  Contrast EXAM: CT OF THE LUMBAR SPINE WITHOUT CONTRAST 11/02/2024 10:24:52 AM  TECHNIQUE: CT of the lumbar spine was performed without the administration of intravenous contrast. Multiplanar reformatted images are provided for review. Automated exposure control, iterative reconstruction, and/or weight based adjustment of the mA/kV was utilized to reduce the radiation dose to as low as reasonably achievable.  COMPARISON: Lumbar MRI 03/05/last year.  CLINICAL HISTORY: 70 year old female. Trauma.  FINDINGS:  BONES AND ALIGNMENT: Normal lumbar segmentation. Chronic straightening of lumbar lordosis. Subtle chronic anterolisthesis of L3 on L4 is stable. Prior cholecystectomy. Chronic appearing and unhealed right L3 transverse process fracture (series 5 image 68). Generalized osteopenia which seems mildly advanced for age. Intact visible sacrum and SI joints. No acute osseous abnormality. No acute traumatic injury identified in the lumbar spine.  SOFT TISSUES: Calcified aortoiliac atherosclerosis. Normal caliber abdominal aorta. Partially visible diverticulosis of the large bowel. Negative lumbar paraspinal soft tissues.  DEGENERATIVE CHANGES: Chronic lumbar spine degeneration, similar to the MRI last year. Degenerative vacuum disc has progressed at both L1-L2 and L4-L5. Chronic grade 1 anterolisthesis at L3-L4 associated with chronic disc bulging and moderate facet arthropathy. 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 At L5-S1, right foraminal disc or disc osteophyte complex seems progressed from last year on series 6 image 107. Query right L5 radiculitis.  IMPRESSION: 1. No acute traumatic injury identified in the lumbar spine. Chronic right L3 transverse process fracture. 2. Chronic lumbar spine degeneration, similar to MRI on 03/05/2023, but suspicion of progressed right foraminal disc or disc osteophyte complex at L5-S1 (series 6 image  107). Query right L5 radiculitis.  Electronically signed by: Helayne Hurst MD 11/02/2024 10:57 AM EST RP Workstation: [id number] CT Cervical Spine Wo Contrast EXAM: CT CERVICAL SPINE WITHOUT CONTRAST 11/02/2024 10:24:52 AM  TECHNIQUE: CT of the cervical spine was performed without the administration of intravenous contrast. Multiplanar reformatted images are provided for review. Automated exposure control, iterative reconstruction, and/or weight based adjustment of the mA/kV was utilized to reduce the radiation dose to as low as reasonably achievable.  COMPARISON: Head CT 11/02/2024 (reported separately). Cervical spine MRI 05/28/2023.  CLINICAL HISTORY: 70  year old female. Trauma.  FINDINGS:  CERVICAL SPINE:  BONES AND ALIGNMENT: Mild extra convex cervical scoliosis. Mild straightening of lordosis is stable. No acute fracture or traumatic malalignment.  DEGENERATIVE CHANGES: Chronic cervical spine degeneration, including disc and endplate degeneration most pronounced at C5-C6, appears stable from the MRI 05/28/2023.  SOFT TISSUES: No prevertebral soft tissue swelling. Bulky calcified atherosclerosis at the skull base. Bilateral carotid bifurcation calcified atherosclerosis. Negative visible non-contrast thoracic inlet.  IMPRESSION: 1. No acute traumatic injury identified in the cervical spine. 2. Stable chronic cervical spine degeneration from MRI last year.  Electronically signed by: Helayne Hurst MD 11/02/2024 10:51 AM EST RP Workstation: [id number] CT HEAD WO CONTRAST ( ) EXAM: CT HEAD WITHOUT CONTRAST 11/02/2024 10:24:52 AM  TECHNIQUE: CT of the head was performed without the administration of intravenous contrast. Automated exposure control, iterative reconstruction, and/or weight based adjustment of the mA/kV was utilized to reduce the radiation dose to as low as reasonably achievable.  COMPARISON: Brain MRI 05/30/2023.  CLINICAL HISTORY: 70 year old  female. Trauma.  FINDINGS:  BRAIN AND VENTRICLES: No acute hemorrhage. No evidence of acute infarct. No hydrocephalus. No extra-axial collection. No mass effect or midline shift. Stable brain volume with minimal limits for age bulky calcified atherosclerosis at the skull base. No suspicious intracranial vascular hyperdensity. Chronic cerebral white matter changes most apparent by CT near the left frontal horns. Asymmetric left corona radiata involvement appears chronic and stable. 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  ORBITS: No acute abnormality.  SINUSES: Stable mild right mastoid effusion. Elsewhere middle ears, mastoids, paranasal sinuses well aerated.  SOFT TISSUES AND SKULL: No acute soft tissue abnormality. No skull fracture.  IMPRESSION: 1. No acute traumatic injury identified. 2. Stable chronic small vessel disease from MRI last year.  Electronically signed by: Helayne Hurst MD 11/02/2024 10:49 AM EST RP Workstation: [id number] Note: Reviewed        Physical Exam  Vitals: There were no vitals taken for this visit. BMI: Estimated body mass index is 18.79 kg/m as calculated from the following:   Height as of 01/06/25: 5' 7 (1.702 m).   Weight as of 01/06/25: 120 lb (54.4 kg). Ideal: Patient weight not recorded General appearance: Well nourished, well developed, and well hydrated. In no apparent acute distress Mental status: Alert, oriented x 3 (person, place, & time)       Respiratory: No evidence of acute respiratory distress Eyes: PERLA   Assessment   Diagnosis Status  No diagnosis found. Controlled Controlled Controlled   Updated Problems: No problems updated.  Plan of Care  Problem-specific:  Assessment and Plan            Sandy Byrd has a current medication list which includes the following long-term medication(s): azelastine , escitalopram , furosemide, levothyroxine , myrbetriq , omeprazole, potassium chloride  sa,  pregabalin , ropinirole , simvastatin, triamterene-hydrochlorothiazide, and ventolin  hfa.  Pharmacotherapy (Medications Ordered): No orders of the defined types were placed in this encounter.  Orders:  No orders of the defined types were placed in this encounter.    {There is no content from the last Plan section.}   No follow-ups on file.    Recent Visits Date Type Provider Dept  01/06/25 Office Visit Lakeyia Surber K, NP [facility]  Showing recent visits within past 90 days and meeting all other requirements Future Appointments Date Type Provider Dept  01/28/25 Appointment Kaely Hollan K, NP [facility]  Showing future appointments within next 90 days and meeting all other  requirements  I discussed the assessment and treatment plan with the patient. The patient was provided an opportunity to ask questions and all were answered. The patient agreed with the plan and demonstrated an understanding of the instructions.  Patient advised to call back or seek an in-person evaluation if the symptoms or condition worsens.  Duration of encounter: *** minutes.  Total time on encounter, as per AMA guidelines included both the face-to-face and non-face-to-face time personally spent by the physician and/or other qualified health care professional(s) on the day of the encounter (includes time in activities that require the physician or other qualified health care professional and does not include time in activities normally performed by clinical staff). Physician's time may include the following activities when performed: Preparing to see the patient (e.g., pre-charting review of records, searching for previously ordered imaging, lab work, and nerve conduction tests) Review of prior analgesic pharmacotherapies. Reviewing PMP Interpreting ordered tests (e.g., lab work, imaging, nerve conduction tests) Performing post-procedure evaluations, including interpretation of diagnostic  procedures Obtaining and/or reviewing separately obtained history Performing a medically appropriate examination and/or evaluation Counseling and educating the patient/family/caregiver Ordering medications, tests, or procedures Referring and communicating with other health care professionals (when not separately reported) Documenting clinical information in the electronic or other health record Independently interpreting results (not separately reported) and communicating results to the patient/ family/caregiver Care coordination (not separately reported)  Note by: Leslee Suire K Kalilah Barua, NP (TTS and AI technology used. I apologize for any typographical errors that were not detected and corrected.) Date: 01/28/2025; Time: 11:22 AM

## 2025-01-28 ENCOUNTER — Ambulatory Visit: Admitting: Nurse Practitioner

## 2025-01-28 ENCOUNTER — Encounter: Payer: Self-pay | Admitting: Nurse Practitioner

## 2025-01-28 VITALS — BP 131/77 | HR 62 | Temp 97.0°F | Resp 18 | Ht 67.0 in | Wt 200.0 lb

## 2025-01-28 DIAGNOSIS — G894 Chronic pain syndrome: Secondary | ICD-10-CM | POA: Diagnosis present

## 2025-01-28 DIAGNOSIS — G8929 Other chronic pain: Secondary | ICD-10-CM | POA: Diagnosis present

## 2025-01-28 DIAGNOSIS — M533 Sacrococcygeal disorders, not elsewhere classified: Secondary | ICD-10-CM | POA: Diagnosis present

## 2025-01-28 DIAGNOSIS — M25561 Pain in right knee: Secondary | ICD-10-CM | POA: Diagnosis present

## 2025-01-28 DIAGNOSIS — M461 Sacroiliitis, not elsewhere classified: Secondary | ICD-10-CM | POA: Insufficient documentation

## 2025-01-28 DIAGNOSIS — G5703 Lesion of sciatic nerve, bilateral lower limbs: Secondary | ICD-10-CM | POA: Diagnosis present

## 2025-01-28 DIAGNOSIS — M7918 Myalgia, other site: Secondary | ICD-10-CM | POA: Diagnosis present

## 2025-01-28 DIAGNOSIS — M47816 Spondylosis without myelopathy or radiculopathy, lumbar region: Secondary | ICD-10-CM | POA: Insufficient documentation

## 2025-01-28 DIAGNOSIS — Z79899 Other long term (current) drug therapy: Secondary | ICD-10-CM | POA: Diagnosis present

## 2025-01-28 MED ORDER — PREGABALIN 100 MG PO CAPS: 100.0000 mg | ORAL_CAPSULE | Freq: Three times a day (TID) | ORAL | 5 refills | Status: AC

## 2025-01-28 MED ORDER — HYDROCODONE-ACETAMINOPHEN 7.5-325 MG PO TABS: 1.0000 | ORAL_TABLET | Freq: Three times a day (TID) | ORAL | 0 refills | Status: AC | PRN

## 2025-01-28 NOTE — Progress Notes (Signed)
 Nursing Pain Medication Assessment:  Safety precautions to be maintained throughout the outpatient stay will include: orient to surroundings, keep bed in low position, maintain call bell within reach at all times, provide assistance with transfer out of bed and ambulation.  Medication Inspection Compliance: Pill count conducted under aseptic conditions, in front of the patient. Neither the pills nor the bottle was removed from the patient's sight at any time. Once count was completed pills were immediately returned to the patient in their original bottle.  Medication: Hydrocodone /APAP Pill/Patch Count: 24 of 45 pills/patches remain Pill/Patch Appearance: Markings consistent with prescribed medication Bottle Appearance: Standard pharmacy container. Clearly labeled. Filled Date: 1 / 63 / 2025 Last Medication intake:  Today  Medication prescribed from PCP, patient was hospitalized.

## 2025-01-28 NOTE — Patient Instructions (Signed)

## 2025-02-02 ENCOUNTER — Encounter: Admitting: Nurse Practitioner

## 2025-02-02 LAB — TOXASSURE SELECT 13 (MW), URINE

## 2025-03-16 ENCOUNTER — Encounter: Admitting: Nurse Practitioner
